# Patient Record
Sex: Female | Born: 1935 | Race: White | Hispanic: No | Marital: Single | Smoking: Former smoker
Health system: Southern US, Community
[De-identification: ages and names within clinical notes are randomized; demographics above are authoritative.]

## PROBLEM LIST (undated history)

## (undated) DIAGNOSIS — I10 Essential (primary) hypertension: Secondary | ICD-10-CM

## (undated) DIAGNOSIS — R609 Edema, unspecified: Secondary | ICD-10-CM

## (undated) DIAGNOSIS — F32A Depression, unspecified: Secondary | ICD-10-CM

## (undated) DIAGNOSIS — I4891 Unspecified atrial fibrillation: Secondary | ICD-10-CM

## (undated) DIAGNOSIS — J189 Pneumonia, unspecified organism: Secondary | ICD-10-CM

## (undated) DIAGNOSIS — E039 Hypothyroidism, unspecified: Secondary | ICD-10-CM

## (undated) DIAGNOSIS — F329 Major depressive disorder, single episode, unspecified: Secondary | ICD-10-CM

## (undated) DIAGNOSIS — IMO0001 Reserved for inherently not codable concepts without codable children: Secondary | ICD-10-CM

## (undated) DIAGNOSIS — R600 Localized edema: Secondary | ICD-10-CM

## (undated) DIAGNOSIS — M2550 Pain in unspecified joint: Secondary | ICD-10-CM

## (undated) DIAGNOSIS — F419 Anxiety disorder, unspecified: Secondary | ICD-10-CM

## (undated) DIAGNOSIS — K589 Irritable bowel syndrome without diarrhea: Secondary | ICD-10-CM

## (undated) DIAGNOSIS — J449 Chronic obstructive pulmonary disease, unspecified: Secondary | ICD-10-CM

## (undated) DIAGNOSIS — C50312 Malignant neoplasm of lower-inner quadrant of left female breast: Principal | ICD-10-CM

## (undated) DIAGNOSIS — G629 Polyneuropathy, unspecified: Secondary | ICD-10-CM

## (undated) DIAGNOSIS — C50919 Malignant neoplasm of unspecified site of unspecified female breast: Secondary | ICD-10-CM

## (undated) DIAGNOSIS — T8182XA Emphysema (subcutaneous) resulting from a procedure, initial encounter: Secondary | ICD-10-CM

## (undated) DIAGNOSIS — J349 Unspecified disorder of nose and nasal sinuses: Secondary | ICD-10-CM

## (undated) DIAGNOSIS — I350 Nonrheumatic aortic (valve) stenosis: Secondary | ICD-10-CM

## (undated) DIAGNOSIS — R233 Spontaneous ecchymoses: Secondary | ICD-10-CM

## (undated) DIAGNOSIS — Z860100 Personal history of colon polyps, unspecified: Secondary | ICD-10-CM

## (undated) DIAGNOSIS — Z8709 Personal history of other diseases of the respiratory system: Secondary | ICD-10-CM

## (undated) DIAGNOSIS — E785 Hyperlipidemia, unspecified: Secondary | ICD-10-CM

## (undated) DIAGNOSIS — IMO0002 Reserved for concepts with insufficient information to code with codable children: Secondary | ICD-10-CM

## (undated) DIAGNOSIS — R238 Other skin changes: Secondary | ICD-10-CM

## (undated) DIAGNOSIS — M797 Fibromyalgia: Secondary | ICD-10-CM

## (undated) DIAGNOSIS — M1990 Unspecified osteoarthritis, unspecified site: Secondary | ICD-10-CM

## (undated) DIAGNOSIS — I639 Cerebral infarction, unspecified: Secondary | ICD-10-CM

## (undated) DIAGNOSIS — Z789 Other specified health status: Secondary | ICD-10-CM

## (undated) HISTORY — DX: Malignant neoplasm of lower-inner quadrant of left female breast: C50.312

## (undated) HISTORY — DX: Reserved for concepts with insufficient information to code with codable children: IMO0002

## (undated) HISTORY — PX: COLONOSCOPY: SHX174

## (undated) HISTORY — DX: Hyperlipidemia, unspecified: E78.5

## (undated) HISTORY — DX: Unspecified disorder of nose and nasal sinuses: J34.9

## (undated) HISTORY — PX: OTHER SURGICAL HISTORY: SHX169

## (undated) HISTORY — PX: APPENDECTOMY: SHX54

## (undated) HISTORY — PX: TUBAL LIGATION: SHX77

## (undated) HISTORY — DX: Major depressive disorder, single episode, unspecified: F32.9

## (undated) HISTORY — DX: Essential (primary) hypertension: I10

## (undated) HISTORY — PX: BREAST LUMPECTOMY: SHX2

## (undated) HISTORY — DX: Reserved for inherently not codable concepts without codable children: IMO0001

## (undated) HISTORY — DX: Cerebral infarction, unspecified: I63.9

## (undated) HISTORY — PX: CHOLECYSTECTOMY: SHX55

## (undated) HISTORY — DX: Unspecified osteoarthritis, unspecified site: M19.90

## (undated) HISTORY — DX: Other specified health status: Z78.9

## (undated) HISTORY — DX: Depression, unspecified: F32.A

## (undated) HISTORY — PX: HEMORRHOID SURGERY: SHX153

## (undated) HISTORY — PX: TONSILLECTOMY: SUR1361

## (undated) HISTORY — DX: Irritable bowel syndrome, unspecified: K58.9

## (undated) HISTORY — DX: Fibromyalgia: M79.7

---

## 1997-08-23 ENCOUNTER — Other Ambulatory Visit: Admission: RE | Admit: 1997-08-23 | Discharge: 1997-08-23 | Payer: Self-pay | Admitting: *Deleted

## 1998-08-26 ENCOUNTER — Other Ambulatory Visit: Admission: RE | Admit: 1998-08-26 | Discharge: 1998-08-26 | Payer: Self-pay | Admitting: *Deleted

## 1999-09-18 ENCOUNTER — Encounter: Payer: Self-pay | Admitting: *Deleted

## 1999-09-18 ENCOUNTER — Encounter: Admission: RE | Admit: 1999-09-18 | Discharge: 1999-09-18 | Payer: Self-pay | Admitting: *Deleted

## 1999-10-02 ENCOUNTER — Encounter: Payer: Self-pay | Admitting: *Deleted

## 1999-10-02 ENCOUNTER — Encounter: Admission: RE | Admit: 1999-10-02 | Discharge: 1999-10-02 | Payer: Self-pay | Admitting: *Deleted

## 2000-09-19 ENCOUNTER — Encounter: Admission: RE | Admit: 2000-09-19 | Discharge: 2000-09-19 | Payer: Self-pay | Admitting: Internal Medicine

## 2000-09-19 ENCOUNTER — Encounter: Payer: Self-pay | Admitting: Internal Medicine

## 2001-10-21 ENCOUNTER — Ambulatory Visit (HOSPITAL_COMMUNITY): Admission: RE | Admit: 2001-10-21 | Discharge: 2001-10-21 | Payer: Self-pay | Admitting: Gastroenterology

## 2001-10-21 ENCOUNTER — Encounter (INDEPENDENT_AMBULATORY_CARE_PROVIDER_SITE_OTHER): Payer: Self-pay

## 2002-04-13 ENCOUNTER — Other Ambulatory Visit: Admission: RE | Admit: 2002-04-13 | Discharge: 2002-04-13 | Payer: Self-pay | Admitting: Obstetrics & Gynecology

## 2002-04-21 ENCOUNTER — Encounter: Admission: RE | Admit: 2002-04-21 | Discharge: 2002-04-21 | Payer: Self-pay | Admitting: Obstetrics & Gynecology

## 2002-04-21 ENCOUNTER — Encounter: Payer: Self-pay | Admitting: Obstetrics & Gynecology

## 2002-06-29 ENCOUNTER — Encounter (INDEPENDENT_AMBULATORY_CARE_PROVIDER_SITE_OTHER): Payer: Self-pay | Admitting: *Deleted

## 2002-06-29 ENCOUNTER — Ambulatory Visit (HOSPITAL_BASED_OUTPATIENT_CLINIC_OR_DEPARTMENT_OTHER): Admission: RE | Admit: 2002-06-29 | Discharge: 2002-06-29 | Payer: Self-pay | Admitting: General Surgery

## 2003-02-09 ENCOUNTER — Encounter: Admission: RE | Admit: 2003-02-09 | Discharge: 2003-02-09 | Payer: Self-pay | Admitting: Internal Medicine

## 2003-12-23 ENCOUNTER — Ambulatory Visit (HOSPITAL_COMMUNITY): Admission: RE | Admit: 2003-12-23 | Discharge: 2003-12-23 | Payer: Self-pay | Admitting: Internal Medicine

## 2004-08-18 ENCOUNTER — Encounter: Admission: RE | Admit: 2004-08-18 | Discharge: 2004-08-18 | Payer: Self-pay | Admitting: Internal Medicine

## 2005-05-30 ENCOUNTER — Ambulatory Visit (HOSPITAL_COMMUNITY): Admission: RE | Admit: 2005-05-30 | Discharge: 2005-05-30 | Payer: Self-pay | Admitting: Internal Medicine

## 2006-09-19 ENCOUNTER — Ambulatory Visit (HOSPITAL_COMMUNITY): Admission: RE | Admit: 2006-09-19 | Discharge: 2006-09-19 | Payer: Self-pay | Admitting: Internal Medicine

## 2006-09-20 ENCOUNTER — Encounter: Admission: RE | Admit: 2006-09-20 | Discharge: 2006-09-20 | Payer: Self-pay | Admitting: Internal Medicine

## 2007-06-30 ENCOUNTER — Encounter: Admission: RE | Admit: 2007-06-30 | Discharge: 2007-06-30 | Payer: Self-pay | Admitting: Internal Medicine

## 2007-07-02 ENCOUNTER — Encounter: Admission: RE | Admit: 2007-07-02 | Discharge: 2007-07-02 | Payer: Self-pay | Admitting: Internal Medicine

## 2007-10-14 ENCOUNTER — Encounter: Admission: RE | Admit: 2007-10-14 | Discharge: 2007-10-14 | Payer: Self-pay | Admitting: Internal Medicine

## 2008-11-23 ENCOUNTER — Encounter: Admission: RE | Admit: 2008-11-23 | Discharge: 2008-11-23 | Payer: Self-pay | Admitting: Internal Medicine

## 2008-11-30 ENCOUNTER — Encounter: Admission: RE | Admit: 2008-11-30 | Discharge: 2008-11-30 | Payer: Self-pay | Admitting: Internal Medicine

## 2008-12-03 ENCOUNTER — Encounter: Admission: RE | Admit: 2008-12-03 | Discharge: 2008-12-03 | Payer: Self-pay | Admitting: Internal Medicine

## 2009-10-27 ENCOUNTER — Ambulatory Visit: Payer: Self-pay | Admitting: Internal Medicine

## 2009-10-27 DIAGNOSIS — I10 Essential (primary) hypertension: Secondary | ICD-10-CM

## 2009-11-01 DIAGNOSIS — E785 Hyperlipidemia, unspecified: Secondary | ICD-10-CM

## 2009-11-01 DIAGNOSIS — E039 Hypothyroidism, unspecified: Secondary | ICD-10-CM | POA: Insufficient documentation

## 2009-11-01 DIAGNOSIS — F329 Major depressive disorder, single episode, unspecified: Secondary | ICD-10-CM | POA: Insufficient documentation

## 2009-11-01 DIAGNOSIS — M1990 Unspecified osteoarthritis, unspecified site: Secondary | ICD-10-CM | POA: Insufficient documentation

## 2009-11-01 DIAGNOSIS — J449 Chronic obstructive pulmonary disease, unspecified: Secondary | ICD-10-CM

## 2009-12-01 ENCOUNTER — Ambulatory Visit: Payer: Self-pay | Admitting: Internal Medicine

## 2009-12-01 DIAGNOSIS — R0602 Shortness of breath: Secondary | ICD-10-CM | POA: Insufficient documentation

## 2009-12-06 ENCOUNTER — Encounter: Admission: RE | Admit: 2009-12-06 | Discharge: 2009-12-06 | Payer: Self-pay | Admitting: Internal Medicine

## 2010-01-31 ENCOUNTER — Ambulatory Visit: Payer: Self-pay | Admitting: Internal Medicine

## 2010-03-07 ENCOUNTER — Encounter (HOSPITAL_COMMUNITY)
Admission: RE | Admit: 2010-03-07 | Discharge: 2010-03-28 | Payer: Self-pay | Source: Home / Self Care | Attending: Internal Medicine | Admitting: Internal Medicine

## 2010-03-20 ENCOUNTER — Encounter: Payer: Self-pay | Admitting: Internal Medicine

## 2010-03-28 NOTE — Miscellaneous (Signed)
Summary: Orders Update pft charges  Clinical Lists Changes  Orders: Added new Service order of Carbon Monoxide diffusing w/capacity (94720) - Signed Added new Service order of Lung Volumes (94240) - Signed Added new Service order of Spirometry (Pre & Post) (94060) - Signed 

## 2010-03-28 NOTE — Assessment & Plan Note (Signed)
Summary: rov 2 months///kp   Primary Provider/Referring Provider:  Ludwig Clarks  CC:  2 month follow up, denies cough, minimal sob with exertion, not taking advair states she doesnt need it, and uses spiriva twice a week.  History of Present Illness: History of Present Illness: October 27, 2009- 75 yoF seen on kind referral by Dr Ludwig Clarks with complaint of difficulty breathing for the past year. Over the past year and especially during the dry summer, she has noted exertional dyspnea, mild dry cough, occasional wheeze and chest tightness. These do not wake her from sleep or interfere with housework. Some days uses Proair rescue inhaler on first waking. Uses Proair at least once daily. Daily Spiriva is helpful and well tolerated. At cataract surgery 08/30/09, a nurse noted breathing difficulty and suggested she get help.  Hx mild seasonal rhinitis and acid reflux.  Had pneumovax w/in last 2 years and annual flu vax. Treated HTN but denies cardiac disease.  December 01, 2009- COPD Follow up visit after PFT and . Breathes some better as the cooler weather has come in. Had a single Pneumovax several years ago and is directed to Dr Ludwig Clarks who knows when that was to see if she is due for her single 5 yr repeat. Had flu shot.  She is getting along ok with little cough or wheeze. Uses Proair maybe once daily, usually in the morning. doesn't wake for it at night. Continues Spiriva, using samples. PFT- Severe obstructive airways disease with response to dilator, airtrapping and reduced DLCO.   FEV1 0.93/ 50%, FEV1/FVC 0.39, DLCO 66%. Emphysema pattern.   January 31, 2010- COPD Nurse-CC: 2 month follow up, denies cough, minimal sob with exertion, not taking advair states she doesnt need it, uses spiriva twice a week Hasn't felt need to try Advair ond only using Spiriva twice weekly. Denies morning cough, sleeps well. Up to date w/ vaccines. She fears becoming like her expired husband who died of COPD.      Preventive Screening-Counseling & Management  Alcohol-Tobacco     Smoking Status: quit     Packs/Day: .5     Year Started: 1942     Year Quit: 2006  Current Medications (verified): 1)  Levothyroxine Sodium 125 Mcg Tabs (Levothyroxine Sodium) .... Once Daily 2)  Atenolol-Chlorthalidone 50-25 Mg Tabs (Atenolol-Chlorthalidone) .... Once Daily 3)  Klor-Con 25 Meq Pack (Potassium Chloride) .... Two Times A Day 4)  Estradiol 1 Mg Tabs (Estradiol) .... Once Daily 5)  Medroxyprogesterone Acetate 2.5 Mg Tabs (Medroxyprogesterone Acetate) .... Once Daily 6)  Bupropion Hcl 100 Mg Tabs (Bupropion Hcl) .... Once Daily 7)  Questran 4 Gm Pack (Cholestyramine) .... Once Daily 8)  Sustane Eye Drops .... As Needed 9)  Multivitamins  Tabs (Multiple Vitamin) .... Once Daily 10)  Tylenol 325 Mg Tabs (Acetaminophen) .... 8tabs Per Day 11)  Lyrica 50 Mg Caps (Pregabalin) .... Two Times A Day 12)  Spiriva Handihaler 18 Mcg Caps (Tiotropium Bromide Monohydrate) .... Inhale One Capsule Once A Day 13)  Advair Diskus 100-50 Mcg/dose Aepb (Fluticasone-Salmeterol) .[name].. 1 Puff and Rinse Mouth, Twice Daily 14)  Proair Hfa 108 (90 Base) Mcg/act Aers (Albuterol Sulfate) .... As Needed  Allergies (verified): 1)  ! Pcn 2)  ! Codeine  Past History:  Past Medical History: Last updated: 12/01/2009 Hypertension Allergy problems sinus problems Arthritis IBS Fibromyalgia C O P D- severe COPD/ emphysema- 12/01/09- FEV1 0.93/ 50%, FEV1/FVC 0.39, + resp to BD Depression Hyperlipidemia Hypothyroidism Osteoarthritis Fibromyalgia  Past  Surgical History: Last updated: 10/27/2009 Tubal ligation--44 years ago Appendectomy-- 61 years ago Tonsillectomy--60 years ago hemorrhoidectomy--5 years ago Cholecystectomy--1998  Family History: Last updated: 10/27/2009 Pt was adopted and doesn't know  Social History: Last updated: 02/28/2010 Divorced- then he died COPD lives alone 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 Quit in 2006. Started 1944. 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 Quit in 2006. Started 1944. 1/2ppd  Review of Systems      See HPI       The patient complains of shortness of breath with activity.  The patient denies shortness of breath at rest, productive cough, non-productive cough, coughing up blood, chest pain, irregular heartbeats, acid heartburn, indigestion, loss of appetite, weight change, abdominal pain, difficulty swallowing, sore throat, tooth/dental problems, headaches, nasal congestion/difficulty breathing through nose, sneezing, itching, ear ache, hand/feet swelling, rash, change in color of mucus, and fever.    Vital Signs:  Patient profile:   76 year old female Height:      63 inches Weight:      144.4 pounds BMI:     25.67 O2 Sat:      98 % on Room air Pulse rate:   58 / minute BP sitting:   158 / 88  (left arm)  Vitals Entered By: Renold Genta RCP, LPN 2010/02/28 9:42 AM)  O2 Flow:  Room air CC: 2 month follow up, denies cough, minimal sob with exertion, not taking advair states she doesnt need it, uses spiriva twice a week Comments Medications reviewed with patient Renold Genta RCP, LPN  02/28/2010 9:47 AM    Physical Exam  Additional Exam:  General: A/Ox3; pleasant and cooperative, NAD, resting room air O2 sat 98% SKIN: no rash, lesions NODES: no lymphadenopathy HEENT: [place]/AT, EOM- WNL, Conjuctivae- clear, PERRLA, TM-WNL, Nose- clear, Throat- clear and wnl, own teeth, Mallampati  II NECK: Supple w/ fair ROM, JVD- none, normal carotid impulses w/o bruits Thyroid- normal to palpation CHEST: Clear to P&A, no wheeze, rhonchi or cough HEART: RRR, no m/g/r heard ABDOMEN: Soft and nl, trim ZOX:WRUE, nl  pulses, slight edema feet NEURO: Grossly intact to observation      Impression & Recommendations:  Problem # 1:  DYSPNEA (ICD-786.05)  Severe COPD, mainly emphysema pattern. I explained again the point of therapeutic trials, and the study suggesting Advair might slow the progression of COPD some over time.  We discussed dyspnea as a symptom, vs weakness and other limitations.  I suggested we let her go for a [place] interval so I can be available to her, but out of the way for now.   Other Orders: Est. Patient Level III (45409)  Patient Instructions: 1)  Please schedule a follow-up appointment in 6 months. 2)  You can continue with Spiriva up to once every day if it helps. 3)  You can try the Advair sample, 1 puff and rinse mouth twice daily. It builds gradually and may take a week or so before you can judge if it is helping you breathe more easily.

## 2010-03-28 NOTE — Assessment & Plan Note (Signed)
Summary: copd/apc   Primary Provider/Referring Provider:  Ludwig Clarks  CC:  Pulomary consult, difficulty breathing, sob with activity, non productive cough, and All going on for 1 years.  History of Present Illness: October 27, 2009- 76 yoF seen on kind referral by Dr Ludwig Clarks with complaint of difficulty breathing for the past year. Over the past year and especially during the dry summer, she has noted exertional dyspnea, mild dry cough, occasional wheeze and chest tightness. These do not wake her from sleep or interfere with housework. Some days uses Proair rescue inhaler on first waking. Uses Proair at least once daily. Daily Spiriva is helpful and well tolerated. At cataract surgery 08/30/09, a nurse noted breathing difficulty and suggested she get help.  Hx mild seasonal rhinitis and acid reflux.  Had pneumovax w/in last 2 years and annual flu vax. Treated HTN but denies cardiac disease.  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 Quit in 2006. Started 1944. 1/2ppd  Risk Factors: Smoking Status: quit (10/27/2009) Packs/Day: .5 (10/27/2009)  Past Medical History: Hypertension Allergy problems sinus problems Arthritis IBS Fibromyalgia C O P D Depression Hyperlipidemia Hypothyroidism Osteoarthritis Fibromyalgia  Past Surgical History: Tubal ligation--44 years ago Appendectomy-- 61 years ago Tonsillectomy--60 years ago hemorrhoidectomy--5 years ago Cholecystectomy--1998  Family History: Pt was adopted and doesn't know  Social History: Divorced lives alone Occupation--retired-- Industrial/product designer Patient states former  smoker. Quit in 2006. Started 1944. 1/2ppd  Smoking Status:  quit Packs/Day:  .5  Review of Systems       The patient complains of shortness of breath with activity, non-productive cough, weight change, difficulty swallowing, sneezing, anxiety, depression, hand/feet swelling, and joint stiffness or pain.  The patient denies shortness of breath at rest, productive cough, coughing up blood, chest pain, irregular heartbeats, acid heartburn, indigestion, loss of appetite, abdominal pain, sore throat, tooth/dental problems, headaches, nasal congestion/difficulty breathing through nose, itching, ear ache, rash, change in color of mucus, and fever.    Vital Signs:  Patient profile:   76 year old female Height:      63 inches Weight:      141.6 pounds BMI:     25.17 O2 Sat:      95 % on Room air Pulse rate:   54 / minute BP sitting:   140 / 84  (right arm) Cuff size:   regular  Vitals Entered By: Reynaldo Minium CMA (October 27, 2009 10:26 AM)  O2 Flow:  Room air  CC: [facility] consult, difficulty breathing, sob with activity, non productive cough, All going on for 1 years Is Patient Diabetic? No Comments meds and allergies updated Reynaldo Minium CMA  October 27, 2009 10:16 AM    Physical Exam  Additional Exam:  General: A/Ox3; pleasant and cooperative, NAD, resting room air O2 sat 95% SKIN: no rash, lesions NODES: no lymphadenopathy HEENT: [place]/AT, EOM- WNL, Conjuctivae- clear, PERRLA, TM-WNL, Nose- clear, Throat- clear and wnl, own teeth, Mallampati  II NECK: Supple w/ fair ROM, JVD- none, normal carotid impulses w/o bruits Thyroid- normal to palpation CHEST: Clear to P&A, no wheeze, rhonchit or cough HEART: RRR, no m/g/r heard ABDOMEN: Soft and nl; nml bowel  sounds; no organomegaly or masses noted, trim ZOX:WRUE, nl pulses, slight edema feet NEURO: Grossly intact to observation      Impression & Recommendations:  Problem # 1:  ? of COPD (ICD-496) This is probably mild COPD,  already recognzed and treated by Dr Ludwig Clarks,  based on her smoking hx. We discussed and encouraged regular aerobic exercise- walking now. She may be a good candidate for pulmonary rehab later. The current meds are reasonable and I won't consider changes until we update data base with PFT and CXR. She seems to be up to date on pneumovax. She gets annual flu vax.  Medications Added to Medication List This Visit: 1)  Levothyroxine Sodium 125 Mcg Tabs (Levothyroxine sodium) .... Once daily 2)  Atenolol-chlorthalidone 50-25 Mg Tabs (Atenolol-chlorthalidone) .... Once daily 3)  Klor-con 25 Meq Pack (Potassium chloride) .... Two times a day 4)  Estradiol 1 Mg Tabs (Estradiol) .... Once daily 5)  Medroxyprogesterone Acetate 2.5 Mg Tabs (Medroxyprogesterone acetate) .... Once daily 6)  Bupropion Hcl 100 Mg Tabs (Bupropion hcl) .... Once daily 7)  Questran 4 Gm Pack (Cholestyramine) .... Once daily 8)  Doxycycline Hyclate 50 Mg Caps (Doxycycline hyclate) .... Once daily 9)  Sustane Eye Drops  .... As needed 10)  Multivitamins Tabs (Multiple vitamin) .... Once daily 11)  Tylenol 325 Mg Tabs (Acetaminophen) .... 8tabs per day 12)  Lyrica 50 Mg Caps (Pregabalin) .... Two times a day 13)  Spiriva Handihaler 18 Mcg Caps (Tiotropium bromide monohydrate) .... Inhale one capsule once a day 14)  Proair Hfa 108 (90 Base) Mcg/act Aers (Albuterol sulfate) .... As needed 15)  Albuterol Inhaler  .... As needed  Other Orders: Consultation Level IV (45409) Full Pulmonary Function Test (PFT) T-2 View CXR ([id number])  Patient Instructions: 1)  Please schedule a follow-up appointment in 1 month. 2)  See [facility] to schedule PFT and 6 minute walk test 3)  A chest x-ray has been recommended.  Your imaging study may require preauthorization.  4)  continue your present meds for now. We will reconsider medications at your next visit with me.  5)  .............................................................................. 6)   DG CHEST 2 VIEW - [id number] 7)    8)  Clinical Data: Shortness of breath, hypertension, former smoker 9)    10)  CHEST - 2 VIEW 11)    12)  Comparison: Chest x-ray of 08/18/2004 13)    14)  Findings: The lungs are clear but hyperaerated consistent with 15)  COPD.  Mediastinal contours are stable.  The heart is within normal 16)  limits in size.  No bony abnormality is seen. 17)    18)  IMPRESSION: 19)  COPD.  No active lung disease. 20)    21)  Read By:  Juline Patch,  M.D.     22)  Released By:  Juline Patch,  M.D. 23)  ___   Immunization History:  Influenza Immunization History:    Influenza:  historical (11/26/2008)  Pneumovax Immunization History:    Pneumovax:  historical (11/26/2008)

## 2010-03-28 NOTE — Assessment & Plan Note (Signed)
Summary: SIX MIN WALK-PULM STRESS TEST  Nurse Visit   Vital Signs:  Patient profile:   76 year old female Pulse rate:   58 / minute BP sitting:   138 / 82  Medications Prior to Update: 1)  Levothyroxine Sodium 125 Mcg Tabs (Levothyroxine Sodium) .... Once Daily 2)  Atenolol-Chlorthalidone 50-25 Mg Tabs (Atenolol-Chlorthalidone) .... Once Daily 3)  Klor-Con 25 Meq Pack (Potassium Chloride) .... Two Times A Day 4)  Estradiol 1 Mg Tabs (Estradiol) .... Once Daily 5)  Medroxyprogesterone Acetate 2.5 Mg Tabs (Medroxyprogesterone Acetate) .... Once Daily 6)  Bupropion Hcl 100 Mg Tabs (Bupropion Hcl) .... Once Daily 7)  Questran 4 Gm Pack (Cholestyramine) .... Once Daily 8)  Doxycycline Hyclate 50 Mg Caps (Doxycycline Hyclate) .... Once Daily 9)  Sustane Eye Drops .... As Needed 10)  Multivitamins  Tabs (Multiple Vitamin) .... Once Daily 11)  Tylenol 325 Mg Tabs (Acetaminophen) .... 8tabs Per Day 12)  Lyrica 50 Mg Caps (Pregabalin) .... Two Times A Day 13)  Spiriva Handihaler 18 Mcg Caps (Tiotropium Bromide Monohydrate) .... Inhale One Capsule Once A Day 14)  Proair Hfa 108 (90 Base) Mcg/act Aers (Albuterol Sulfate) .... As Needed 15)  Albuterol Inhaler .... As Needed  Orders Added: 1)  Pulmonary Stress (6 min walk) [94620]   Six Minute Walk Test Medications taken before test(dose and time): 1)  Levothyroxine Sodium 125 Mcg Tabs (Levothyroxine Sodium) .... Once Daily 2)  Atenolol-Chlorthalidone 50-25 Mg Tabs (Atenolol-Chlorthalidone) .... Once Daily 3)  Klor-Con 25 Meq Pack (Potassium Chloride) .... Two Times A Day 4)  Estradiol 1 Mg Tabs (Estradiol) .... Once Daily 5)  Medroxyprogesterone Acetate 2.5 Mg Tabs (Medroxyprogesterone Acetate) .... Once Daily 6)  Bupropion Hcl 100 Mg Tabs (Bupropion Hcl) .... Once Daily 7)  Questran 4 Gm Pack (Cholestyramine) .... Once Daily 8)  Doxycycline Hyclate 50 Mg Caps (Doxycycline Hyclate) .... Once Daily 9)  Sustane Eye Drops .... As Needed 10)   Multivitamins  Tabs (Multiple Vitamin) .... Once Daily 11)  Tylenol 325 Mg Tabs (Acetaminophen) .... 8tabs Per Day 12)  Lyrica 50 Mg Caps (Pregabalin) .... Two Times A Day 13)  Spiriva Handihaler 18 Mcg Caps (Tiotropium Bromide Monohydrate) .... Inhale One Capsule Once A Day 14)  Proair Hfa 108 (90 Base) Mcg/act Aers (Albuterol Sulfate) .... As Needed 15)  Albuterol Inhaler .... As Needed Pt took all meds at 7:30am except inhalers which she states she took around 6:00am Supplemental oxygen during the test: No  Lap counter(place a tick mark inside a square for each lap completed) lap 1 complete  lap 2 complete   lap 3 complete   lap 4 complete  lap 5 complete  lap 6 complete  lap 7 complete    Baseline  BP sitting: 138/ 82 Heart rate: 58 Dyspnea ( Borg scale) 0 Fatigue (Borg scale) 0 SPO2 97  End Of Test  BP sitting: 148/ 98 Heart rate: 82 Dyspnea ( Borg scale) 3 Fatigue (Borg scale) 0 SPO2 95  2 Minutes post  BP sitting: 142/ 84 Heart rate: 62 SPO2 99  Stopped or paused before six minutes? No  Interpretation: Number of laps  7 X 48 meters =   336 meters =    336 meters   Total distance walked in six minutes: 336 meters  Tech ID: Tivis Ringer, CNA (December 01, 2009 10:11 AM) Jeremy Johann Comments Pt completed test w/ 0 rest breaks and 1 complaint: SOB which resolved at 2 mins post.

## 2010-03-28 NOTE — Assessment & Plan Note (Signed)
Summary: rov after smw / pft ///kp   Primary Provider/Referring Provider:  Ludwig Clarks  CC:  Follow up visit after PFT and .[name]  History of Present Illness: History of Present Illness: October 27, 2009- 76 yoF seen on kind referral by Dr Ludwig Clarks with complaint of difficulty breathing for the past year. Over the past year and especially during the dry summer, she has noted exertional dyspnea, mild dry cough, occasional wheeze and chest tightness. These do not wake her from sleep or interfere with housework. Some days uses Proair rescue inhaler on first waking. Uses Proair at least once daily. Daily Spiriva is helpful and well tolerated. At cataract surgery 08/30/09, a nurse noted breathing difficulty and suggested she get help.  Hx mild seasonal rhinitis and acid reflux.  Had pneumovax w/in last 2 years and annual flu vax. Treated HTN but denies cardiac disease.  December 01, 2009- COPD Follow up visit after PFT and . Breathes some better as the cooler weather has come in. Had a single Pneumovax several years ago and is directed to Dr Ludwig Clarks who knows when that was to see if she is due for her single 5 yr repeat. Had flu shot.  She is getting along ok with little cough or wheeze. Uses Proair maybe once daily, usually in the morning. doesn't wake for it at night. Continues Spiriva, using samples. PFT- Severe obstructive airways disease with response to dilator, airtrapping and reduced DLCO.   FEV1 0.93/ 50%, FEV1/FVC 0.39, DLCO 66%. Emphysema pattern.      Preventive Screening-Counseling & Management  Alcohol-Tobacco     Smoking Status: quit     Packs/Day: .5     Year Started: 1942     Year Quit: 2006  Current Medications (verified): 1)  Levothyroxine Sodium 125 Mcg Tabs (Levothyroxine Sodium) .... Once Daily 2)  Atenolol-Chlorthalidone 50-25 Mg Tabs (Atenolol-Chlorthalidone) .... Once Daily 3)  Klor-Con 25 Meq Pack (Potassium Chloride) .... Two Times A Day 4)  Estradiol 1 Mg Tabs  (Estradiol) .... Once Daily 5)  Medroxyprogesterone Acetate 2.5 Mg Tabs (Medroxyprogesterone Acetate) .... Once Daily 6)  Bupropion Hcl 100 Mg Tabs (Bupropion Hcl) .... Once Daily 7)  Questran 4 Gm Pack (Cholestyramine) .... Once Daily 8)  Doxycycline Hyclate 50 Mg Caps (Doxycycline Hyclate) .... Once Daily 9)  Sustane Eye Drops .... As Needed 10)  Multivitamins  Tabs (Multiple Vitamin) .... Once Daily 11)  Tylenol 325 Mg Tabs (Acetaminophen) .... 8tabs Per Day 12)  Lyrica 50 Mg Caps (Pregabalin) .... Two Times A Day 13)  Spiriva Handihaler 18 Mcg Caps (Tiotropium Bromide Monohydrate) .... Inhale One Capsule Once A Day 14)  Proair Hfa 108 (90 Base) Mcg/act Aers (Albuterol Sulfate) .... As Needed 15)  Albuterol Inhaler .... As Needed  Allergies (verified): 1)  ! Pcn 2)  ! 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 Quit in 2006. Started 1944. 1/2ppd  Risk Factors: Smoking Status: quit (12/01/2009) Packs/Day: .5 (12/01/2009)  Past Medical History: Hypertension Allergy problems sinus problems Arthritis IBS Fibromyalgia C O P D- severe COPD/ emphysema- 12/01/09- FEV1 0.93/ 50%, FEV1/FVC 0.39, + resp to BD Depression Hyperlipidemia Hypothyroidism Osteoarthritis Fibromyalgia  Review of Systems      See HPI  The patient complains of shortness of breath with activity and non-productive cough.  The patient denies shortness of breath at rest, productive cough, coughing up blood, chest pain, irregular heartbeats, acid heartburn, indigestion, loss of appetite, weight change, abdominal pain,  difficulty swallowing, sore throat, tooth/dental problems, headaches, nasal congestion/difficulty breathing through nose, sneezing, itching, rash, change in color of mucus, and fever.    Vital Signs:  Patient profile:   76 year old female Height:      63 inches Weight:      143 pounds BMI:     25.42 O2 Sat:      94 % on Room air Pulse rate:   62 / minute BP sitting:   170 / 82  (right arm) Cuff size:   regular  Vitals Entered By: Reynaldo Minium CMA (December 01, 2009 11:27 AM)  O2 Flow:  Room air CC: Follow up visit after PFT and .   Physical Exam  Additional Exam:  General: A/Ox3; pleasant and cooperative, NAD, resting room air O2 sat 94% SKIN: no rash, lesions NODES: no lymphadenopathy HEENT: [place]/AT, EOM- WNL, Conjuctivae- clear, PERRLA, TM-WNL, Nose- clear, Throat- clear and wnl, own teeth, Mallampati  II NECK: Supple w/ fair ROM, JVD- none, normal carotid impulses w/o bruits Thyroid- normal to palpation CHEST: Clear to P&A, no wheeze, rhonchi or cough HEART: RRR, no m/g/r heard ABDOMEN: Soft and nl, trim NGE:XBMW, nl pulses, slight edema feet NEURO: Grossly intact to observation      Impression & Recommendations:  Problem # 1:  C O P D (ICD-496) Severe COPD, mainly emphysema with a reversible component.  We will formalize her Spiriva with a script, and add a trial of Advair for effort to slow progression. We discussed and will refer her to pulmonary rehabilitation.  Problem # 2:  DYSPNEA (ICD-786.05) Dyspnea on exertion relates mainly to her lung disease and maybe some deconditioning. Cardiac symptoms were discussed for education,  but not identified now.   Medications Added to Medication List This Visit: 1)  Advair Diskus 100-50 Mcg/dose Aepb (Fluticasone-salmeterol) .[name].. 1 puff and rinse mouth, twice daily  Other Orders: Consultation Level IV (41324) Rehabilitation Referral (Rehab)  Patient Instructions: 1)  cc Dr Ludwig Clarks 2)  Please schedule a follow-up  appointment in 2 months. 3)  Script to use Spiriva once daily 4)  Sample/ script to start Advair 100/50: 5)        1 puff and rinse mouth, twice daily 6)  Continue using Proair as a rescue inhaler up to 4 x daily if needed 7)  [facility] for referral to the Pulmonary Rehab program. Prescriptions: ADVAIR DISKUS 100-50 MCG/DOSE AEPB (FLUTICASONE-SALMETEROL) 1 puff and rinse mouth, twice daily  #1 x prn   Entered and Authorized by:   Waymon Budge MD   Signed by:   Waymon Budge MD on 12/01/2009   Method used:   Print then Give to Patient   RxID:   [id number] SPIRIVA HANDIHALER 18 MCG CAPS (TIOTROPIUM BROMIDE MONOHYDRATE) inhale one capsule once a day  #30 x prn   Entered and Authorized by:   Waymon Budge MD   Signed by:   Waymon Budge MD on 12/01/2009   Method used:   Print then Give to Patient   RxID:   [id number]    Immunization History:  Influenza Immunization History:    Influenza:  historical (11/28/2009)

## 2010-03-30 ENCOUNTER — Encounter (HOSPITAL_COMMUNITY): Payer: Medicare Other | Attending: Internal Medicine

## 2010-03-30 DIAGNOSIS — F329 Major depressive disorder, single episode, unspecified: Secondary | ICD-10-CM | POA: Insufficient documentation

## 2010-03-30 DIAGNOSIS — F3289 Other specified depressive episodes: Secondary | ICD-10-CM | POA: Insufficient documentation

## 2010-03-30 DIAGNOSIS — I10 Essential (primary) hypertension: Secondary | ICD-10-CM | POA: Insufficient documentation

## 2010-03-30 DIAGNOSIS — R0989 Other specified symptoms and signs involving the circulatory and respiratory systems: Secondary | ICD-10-CM | POA: Insufficient documentation

## 2010-03-30 DIAGNOSIS — E785 Hyperlipidemia, unspecified: Secondary | ICD-10-CM | POA: Insufficient documentation

## 2010-03-30 DIAGNOSIS — Z5189 Encounter for other specified aftercare: Secondary | ICD-10-CM | POA: Insufficient documentation

## 2010-03-30 DIAGNOSIS — J4489 Other specified chronic obstructive pulmonary disease: Secondary | ICD-10-CM | POA: Insufficient documentation

## 2010-03-30 DIAGNOSIS — E039 Hypothyroidism, unspecified: Secondary | ICD-10-CM | POA: Insufficient documentation

## 2010-03-30 DIAGNOSIS — R0609 Other forms of dyspnea: Secondary | ICD-10-CM | POA: Insufficient documentation

## 2010-03-30 DIAGNOSIS — J449 Chronic obstructive pulmonary disease, unspecified: Secondary | ICD-10-CM | POA: Insufficient documentation

## 2010-04-04 ENCOUNTER — Encounter (HOSPITAL_COMMUNITY): Payer: Medicare Other

## 2010-04-06 ENCOUNTER — Encounter (HOSPITAL_COMMUNITY): Payer: Medicare Other

## 2010-04-11 ENCOUNTER — Encounter (HOSPITAL_COMMUNITY): Payer: Medicare Other

## 2010-04-13 ENCOUNTER — Encounter (HOSPITAL_COMMUNITY): Payer: Medicare Other

## 2010-04-18 ENCOUNTER — Encounter (HOSPITAL_COMMUNITY): Payer: Medicare Other

## 2010-04-20 ENCOUNTER — Encounter (HOSPITAL_COMMUNITY): Payer: Medicare Other

## 2010-04-25 ENCOUNTER — Encounter (HOSPITAL_COMMUNITY): Payer: Medicare Other

## 2010-04-27 ENCOUNTER — Encounter (HOSPITAL_COMMUNITY): Payer: Medicare Other | Attending: Internal Medicine

## 2010-04-27 DIAGNOSIS — J449 Chronic obstructive pulmonary disease, unspecified: Secondary | ICD-10-CM | POA: Insufficient documentation

## 2010-04-27 DIAGNOSIS — F3289 Other specified depressive episodes: Secondary | ICD-10-CM | POA: Insufficient documentation

## 2010-04-27 DIAGNOSIS — E039 Hypothyroidism, unspecified: Secondary | ICD-10-CM | POA: Insufficient documentation

## 2010-04-27 DIAGNOSIS — R0989 Other specified symptoms and signs involving the circulatory and respiratory systems: Secondary | ICD-10-CM | POA: Insufficient documentation

## 2010-04-27 DIAGNOSIS — R0609 Other forms of dyspnea: Secondary | ICD-10-CM | POA: Insufficient documentation

## 2010-04-27 DIAGNOSIS — J4489 Other specified chronic obstructive pulmonary disease: Secondary | ICD-10-CM | POA: Insufficient documentation

## 2010-04-27 DIAGNOSIS — E785 Hyperlipidemia, unspecified: Secondary | ICD-10-CM | POA: Insufficient documentation

## 2010-04-27 DIAGNOSIS — F329 Major depressive disorder, single episode, unspecified: Secondary | ICD-10-CM | POA: Insufficient documentation

## 2010-04-27 DIAGNOSIS — I10 Essential (primary) hypertension: Secondary | ICD-10-CM | POA: Insufficient documentation

## 2010-04-27 DIAGNOSIS — Z5189 Encounter for other specified aftercare: Secondary | ICD-10-CM | POA: Insufficient documentation

## 2010-05-02 ENCOUNTER — Encounter (HOSPITAL_COMMUNITY): Payer: Medicare Other

## 2010-05-04 ENCOUNTER — Encounter (HOSPITAL_COMMUNITY): Payer: Medicare Other

## 2010-05-09 ENCOUNTER — Encounter (HOSPITAL_COMMUNITY): Payer: Medicare Other

## 2010-05-11 ENCOUNTER — Encounter (HOSPITAL_COMMUNITY): Payer: Medicare Other

## 2010-05-16 ENCOUNTER — Encounter (HOSPITAL_COMMUNITY): Payer: Medicare Other

## 2010-05-18 ENCOUNTER — Encounter (HOSPITAL_COMMUNITY): Payer: Medicare Other

## 2010-05-23 ENCOUNTER — Encounter (HOSPITAL_COMMUNITY): Payer: Medicare Other

## 2010-05-25 ENCOUNTER — Encounter (HOSPITAL_COMMUNITY): Payer: Medicare Other

## 2010-05-30 ENCOUNTER — Encounter (HOSPITAL_COMMUNITY): Payer: Medicare Other | Attending: Internal Medicine

## 2010-05-30 DIAGNOSIS — R0609 Other forms of dyspnea: Secondary | ICD-10-CM | POA: Insufficient documentation

## 2010-05-30 DIAGNOSIS — E039 Hypothyroidism, unspecified: Secondary | ICD-10-CM | POA: Insufficient documentation

## 2010-05-30 DIAGNOSIS — J4489 Other specified chronic obstructive pulmonary disease: Secondary | ICD-10-CM | POA: Insufficient documentation

## 2010-05-30 DIAGNOSIS — Z5189 Encounter for other specified aftercare: Secondary | ICD-10-CM | POA: Insufficient documentation

## 2010-05-30 DIAGNOSIS — F329 Major depressive disorder, single episode, unspecified: Secondary | ICD-10-CM | POA: Insufficient documentation

## 2010-05-30 DIAGNOSIS — I10 Essential (primary) hypertension: Secondary | ICD-10-CM | POA: Insufficient documentation

## 2010-05-30 DIAGNOSIS — F3289 Other specified depressive episodes: Secondary | ICD-10-CM | POA: Insufficient documentation

## 2010-05-30 DIAGNOSIS — J449 Chronic obstructive pulmonary disease, unspecified: Secondary | ICD-10-CM | POA: Insufficient documentation

## 2010-05-30 DIAGNOSIS — R0989 Other specified symptoms and signs involving the circulatory and respiratory systems: Secondary | ICD-10-CM | POA: Insufficient documentation

## 2010-05-30 DIAGNOSIS — E785 Hyperlipidemia, unspecified: Secondary | ICD-10-CM | POA: Insufficient documentation

## 2010-06-01 ENCOUNTER — Encounter (HOSPITAL_COMMUNITY): Payer: Medicare Other

## 2010-06-06 ENCOUNTER — Encounter (HOSPITAL_COMMUNITY): Payer: Medicare Other

## 2010-06-08 ENCOUNTER — Encounter (HOSPITAL_COMMUNITY): Payer: Medicare Other

## 2010-06-13 ENCOUNTER — Encounter (HOSPITAL_COMMUNITY): Payer: Medicare Other

## 2010-06-15 ENCOUNTER — Encounter (HOSPITAL_COMMUNITY): Payer: Medicare Other

## 2010-06-20 ENCOUNTER — Encounter (HOSPITAL_COMMUNITY): Payer: Medicare Other

## 2010-06-22 ENCOUNTER — Encounter (HOSPITAL_COMMUNITY): Payer: Medicare Other

## 2010-06-27 ENCOUNTER — Encounter (HOSPITAL_COMMUNITY): Payer: Medicare Other

## 2010-06-29 ENCOUNTER — Encounter (HOSPITAL_COMMUNITY): Payer: Medicare Other

## 2010-07-04 ENCOUNTER — Encounter (HOSPITAL_COMMUNITY): Payer: Medicare Other

## 2010-07-06 ENCOUNTER — Encounter (HOSPITAL_COMMUNITY): Payer: Medicare Other

## 2010-07-09 ENCOUNTER — Inpatient Hospital Stay (HOSPITAL_COMMUNITY)
Admission: EM | Admit: 2010-07-09 | Discharge: 2010-07-18 | DRG: 641 | Disposition: A | Discharge status: Home Health | Payer: Medicare Other | Attending: Internal Medicine | Admitting: Internal Medicine

## 2010-07-09 ENCOUNTER — Emergency Department (HOSPITAL_COMMUNITY): Payer: Medicare Other

## 2010-07-09 DIAGNOSIS — T43205A Adverse effect of unspecified antidepressants, initial encounter: Secondary | ICD-10-CM | POA: Diagnosis present

## 2010-07-09 DIAGNOSIS — Z79899 Other long term (current) drug therapy: Secondary | ICD-10-CM

## 2010-07-09 DIAGNOSIS — N393 Stress incontinence (female) (male): Secondary | ICD-10-CM | POA: Diagnosis present

## 2010-07-09 DIAGNOSIS — IMO0001 Reserved for inherently not codable concepts without codable children: Secondary | ICD-10-CM | POA: Diagnosis present

## 2010-07-09 DIAGNOSIS — T502X5A Adverse effect of carbonic-anhydrase inhibitors, benzothiadiazides and other diuretics, initial encounter: Secondary | ICD-10-CM | POA: Diagnosis present

## 2010-07-09 DIAGNOSIS — J441 Chronic obstructive pulmonary disease with (acute) exacerbation: Secondary | ICD-10-CM | POA: Diagnosis not present

## 2010-07-09 DIAGNOSIS — R609 Edema, unspecified: Secondary | ICD-10-CM | POA: Diagnosis not present

## 2010-07-09 DIAGNOSIS — E876 Hypokalemia: Secondary | ICD-10-CM | POA: Diagnosis present

## 2010-07-09 DIAGNOSIS — R5381 Other malaise: Secondary | ICD-10-CM | POA: Diagnosis not present

## 2010-07-09 DIAGNOSIS — A088 Other specified intestinal infections: Secondary | ICD-10-CM | POA: Diagnosis present

## 2010-07-09 DIAGNOSIS — M810 Age-related osteoporosis without current pathological fracture: Secondary | ICD-10-CM | POA: Diagnosis present

## 2010-07-09 DIAGNOSIS — I959 Hypotension, unspecified: Secondary | ICD-10-CM | POA: Diagnosis not present

## 2010-07-09 DIAGNOSIS — E7800 Pure hypercholesterolemia, unspecified: Secondary | ICD-10-CM | POA: Diagnosis present

## 2010-07-09 DIAGNOSIS — E039 Hypothyroidism, unspecified: Secondary | ICD-10-CM | POA: Diagnosis present

## 2010-07-09 DIAGNOSIS — E871 Hypo-osmolality and hyponatremia: Principal | ICD-10-CM | POA: Diagnosis present

## 2010-07-09 DIAGNOSIS — Z880 Allergy status to penicillin: Secondary | ICD-10-CM

## 2010-07-09 DIAGNOSIS — IMO0002 Reserved for concepts with insufficient information to code with codable children: Secondary | ICD-10-CM

## 2010-07-09 DIAGNOSIS — Z79890 Hormone replacement therapy (postmenopausal): Secondary | ICD-10-CM

## 2010-07-09 DIAGNOSIS — E46 Unspecified protein-calorie malnutrition: Secondary | ICD-10-CM | POA: Diagnosis present

## 2010-07-09 DIAGNOSIS — I4891 Unspecified atrial fibrillation: Secondary | ICD-10-CM | POA: Diagnosis not present

## 2010-07-09 DIAGNOSIS — T448X5A Adverse effect of centrally-acting and adrenergic-neuron-blocking agents, initial encounter: Secondary | ICD-10-CM | POA: Diagnosis present

## 2010-07-09 DIAGNOSIS — I10 Essential (primary) hypertension: Secondary | ICD-10-CM | POA: Diagnosis present

## 2010-07-09 DIAGNOSIS — E869 Volume depletion, unspecified: Secondary | ICD-10-CM | POA: Diagnosis present

## 2010-07-09 LAB — BASIC METABOLIC PANEL
BUN: 10 mg/dL (ref 6–23)
CO2: 25 mEq/L (ref 19–32)
CO2: 22 mEq/L (ref 19–32)
Calcium: 8.1 mg/dL — ABNORMAL LOW (ref 8.4–10.5)
Calcium: 7.8 mg/dL — ABNORMAL LOW (ref 8.4–10.5)
Chloride: 72 mEq/L — ABNORMAL LOW (ref 96–112)
Chloride: 74 mEq/L — ABNORMAL LOW (ref 96–112)
Creatinine, Ser: <0.47 mg/dL (ref 0.4–1.2)

## 2010-07-09 LAB — CBC
HCT: 36.4 % (ref 36.0–46.0)
Hemoglobin: 13.4 g/dL (ref 12.0–15.0)
MCH: 32.5 pg (ref 26.0–34.0)
MCV: 88.3 fL (ref 78.0–100.0)
RBC: 4.12 MIL/uL (ref 3.87–5.11)

## 2010-07-09 LAB — DIFFERENTIAL
Basophils Absolute: 0 10*3/uL (ref 0.0–0.1)
Neutrophils Relative %: 87 % — ABNORMAL HIGH (ref 43–77)

## 2010-07-09 LAB — URINALYSIS, ROUTINE W REFLEX MICROSCOPIC
Bilirubin Urine: NEGATIVE
Glucose, UA: NEGATIVE mg/dL
Ketones, ur: 40 mg/dL — AB
Leukocytes, UA: NEGATIVE
Nitrite: NEGATIVE
Specific Gravity, Urine: 1.013 (ref 1.005–1.030)
pH: 6.5 (ref 5.0–8.0)

## 2010-07-09 LAB — POCT I-STAT, CHEM 8
BUN: 13 mg/dL (ref 6–23)
Creatinine, Ser: 0.6 mg/dL (ref 0.4–1.2)
HCT: 43 % (ref 36.0–46.0)
Hemoglobin: 14.6 g/dL (ref 12.0–15.0)
Potassium: 3.3 mEq/L — ABNORMAL LOW (ref 3.5–5.1)
TCO2: 26 mmol/L (ref 0–100)

## 2010-07-10 LAB — BASIC METABOLIC PANEL
BUN: 7 mg/dL (ref 6–23)
CO2: 25 mEq/L (ref 19–32)
CO2: 20 mEq/L (ref 19–32)
Chloride: 90 mEq/L — ABNORMAL LOW (ref 96–112)
Chloride: 89 mEq/L — ABNORMAL LOW (ref 96–112)
Creatinine, Ser: <0.47 mg/dL (ref 0.4–1.2)
GFR calc Af Amer: >60 mL/min (ref >60)
Glucose, Bld: 124 mg/dL — ABNORMAL HIGH (ref 70–99)
Glucose, Bld: 170 mg/dL — ABNORMAL HIGH (ref 70–99)
Potassium: 4.1 mEq/L (ref 3.5–5.1)
Potassium: 3.9 mEq/L (ref 3.5–5.1)
Potassium: 4.3 mEq/L (ref 3.5–5.1)
Sodium: 116 mEq/L — CL (ref 135–145)
Sodium: 124 mEq/L — ABNORMAL LOW (ref 135–145)

## 2010-07-10 LAB — COMPREHENSIVE METABOLIC PANEL
ALT: 18 U/L (ref 0–35)
Albumin: 2.8 g/dL — ABNORMAL LOW (ref 3.5–5.2)
Alkaline Phosphatase: 27 U/L — ABNORMAL LOW (ref 39–117)
BUN: 7 mg/dL (ref 6–23)
Chloride: 87 mEq/L — ABNORMAL LOW (ref 96–112)
Glucose, Bld: 104 mg/dL — ABNORMAL HIGH (ref 70–99)
Potassium: 4.3 mEq/L (ref 3.5–5.1)
Sodium: 120 mEq/L — ABNORMAL LOW (ref 135–145)
Total Bilirubin: 0.2 mg/dL — ABNORMAL LOW (ref 0.3–1.2)

## 2010-07-10 LAB — PHOSPHORUS: Phosphorus: 1 mg/dL — CL (ref 2.3–4.6)

## 2010-07-10 LAB — CBC
HCT: 36 % (ref 36.0–46.0)
MCV: 90.7 fL (ref 78.0–100.0)
Platelets: 283 10*3/uL (ref 150–400)
RBC: 3.97 MIL/uL (ref 3.87–5.11)
WBC: 16 10*3/uL — ABNORMAL HIGH (ref 4.0–10.5)

## 2010-07-11 ENCOUNTER — Encounter (HOSPITAL_COMMUNITY): Payer: Medicare Other

## 2010-07-11 LAB — CARDIAC PANEL(CRET KIN+CKTOT+MB+TROPI)
CK, MB: 27.4 ng/mL (ref 0.3–4.0)
CK, MB: 23.8 ng/mL (ref 0.3–4.0)
CK, MB: 26.8 ng/mL (ref 0.3–4.0)
Relative Index: 5.9 — ABNORMAL HIGH (ref 0.0–2.5)
Relative Index: 7 — ABNORMAL HIGH (ref 0.0–2.5)
Relative Index: 7.3 — ABNORMAL HIGH (ref 0.0–2.5)
Relative Index: 7.8 — ABNORMAL HIGH (ref 0.0–2.5)
Total CK: 468 U/L — ABNORMAL HIGH (ref 7–177)
Total CK: 340 U/L — ABNORMAL HIGH (ref 7–177)
Total CK: 359 U/L — ABNORMAL HIGH (ref 7–177)
Total CK: 365 U/L — ABNORMAL HIGH (ref 7–177)
Troponin I: <0.3 ng/mL (ref <0.30)
Troponin I: <0.3 ng/mL (ref <0.30)

## 2010-07-11 LAB — BASIC METABOLIC PANEL WITH GFR
BUN: 5 mg/dL — ABNORMAL LOW (ref 6–23)
BUN: 6 mg/dL (ref 6–23)
BUN: 6 mg/dL (ref 6–23)
CO2: 24 meq/L (ref 19–32)
CO2: 23 meq/L (ref 19–32)
CO2: 26 meq/L (ref 19–32)
Calcium: 7.4 mg/dL — ABNORMAL LOW (ref 8.4–10.5)
Calcium: 7.5 mg/dL — ABNORMAL LOW (ref 8.4–10.5)
Calcium: 7.6 mg/dL — ABNORMAL LOW (ref 8.4–10.5)
Chloride: 92 meq/L — ABNORMAL LOW (ref 96–112)
Chloride: 92 meq/L — ABNORMAL LOW (ref 96–112)
Chloride: 93 meq/L — ABNORMAL LOW (ref 96–112)
Creatinine, Ser: <0.47 mg/dL (ref 0.4–1.2)
Creatinine, Ser: <0.47 mg/dL (ref 0.4–1.2)
Creatinine, Ser: 0.49 mg/dL (ref 0.4–1.2)
GFR calc non Af Amer: >60 mL/min
Glucose, Bld: 170 mg/dL — ABNORMAL HIGH (ref 70–99)
Glucose, Bld: 137 mg/dL — ABNORMAL HIGH (ref 70–99)
Glucose, Bld: 138 mg/dL — ABNORMAL HIGH (ref 70–99)
Potassium: 3.4 meq/L — ABNORMAL LOW (ref 3.5–5.1)
Potassium: 4.1 meq/L (ref 3.5–5.1)
Potassium: 4.1 meq/L (ref 3.5–5.1)
Sodium: 124 meq/L — ABNORMAL LOW (ref 135–145)
Sodium: 126 meq/L — ABNORMAL LOW (ref 135–145)
Sodium: 127 meq/L — ABNORMAL LOW (ref 135–145)

## 2010-07-11 LAB — CBC
MCHC: 35 g/dL (ref 30.0–36.0)
Platelets: 279 10*3/uL (ref 150–400)
RDW: 12.1 % (ref 11.5–15.5)
WBC: 9.7 10*3/uL (ref 4.0–10.5)

## 2010-07-11 LAB — URINE CULTURE
Colony Count: NO GROWTH
Culture  Setup Time: 201205140001
Culture: NO GROWTH
Special Requests: NEGATIVE

## 2010-07-11 LAB — BASIC METABOLIC PANEL
CO2: 23 mEq/L (ref 19–32)
Chloride: 92 mEq/L — ABNORMAL LOW (ref 96–112)
Glucose, Bld: 141 mg/dL — ABNORMAL HIGH (ref 70–99)
Sodium: 125 mEq/L — ABNORMAL LOW (ref 135–145)

## 2010-07-11 LAB — OSMOLALITY, URINE: Osmolality, Ur: 374 mOsm/kg — ABNORMAL LOW (ref 390–1090)

## 2010-07-11 LAB — MAGNESIUM: Magnesium: 2.1 mg/dL (ref 1.5–2.5)

## 2010-07-12 LAB — CBC
MCV: 93.4 fL (ref 78.0–100.0)
Platelets: 280 10*3/uL (ref 150–400)
RBC: 3.61 MIL/uL — ABNORMAL LOW (ref 3.87–5.11)
RDW: 12.2 % (ref 11.5–15.5)
WBC: 14.9 10*3/uL — ABNORMAL HIGH (ref 4.0–10.5)

## 2010-07-12 LAB — BASIC METABOLIC PANEL
BUN: 6 mg/dL (ref 6–23)
CO2: 24 mEq/L (ref 19–32)
CO2: 29 mEq/L (ref 19–32)
Calcium: 7.6 mg/dL — ABNORMAL LOW (ref 8.4–10.5)
Calcium: 7.6 mg/dL — ABNORMAL LOW (ref 8.4–10.5)
Chloride: 94 mEq/L — ABNORMAL LOW (ref 96–112)
Chloride: 88 mEq/L — ABNORMAL LOW (ref 96–112)
Creatinine, Ser: <0.47 mg/dL (ref 0.4–1.2)
Glucose, Bld: 117 mg/dL — ABNORMAL HIGH (ref 70–99)
Potassium: 3.8 mEq/L (ref 3.5–5.1)
Potassium: 4.2 mEq/L (ref 3.5–5.1)
Sodium: 128 mEq/L — ABNORMAL LOW (ref 135–145)
Sodium: 122 mEq/L — ABNORMAL LOW (ref 135–145)

## 2010-07-12 LAB — CARDIAC PANEL(CRET KIN+CKTOT+MB+TROPI)
CK, MB: 19.5 ng/mL (ref 0.3–4.0)
Relative Index: 8.8 — ABNORMAL HIGH (ref 0.0–2.5)
Total CK: 221 U/L — ABNORMAL HIGH (ref 7–177)
Total CK: 218 U/L — ABNORMAL HIGH (ref 7–177)
Troponin I: <0.3 ng/mL (ref <0.30)

## 2010-07-12 LAB — GLUCOSE, CAPILLARY: Glucose-Capillary: 115 mg/dL — ABNORMAL HIGH (ref 70–99)

## 2010-07-12 LAB — MAGNESIUM: Magnesium: 2.2 mg/dL (ref 1.5–2.5)

## 2010-07-13 LAB — BASIC METABOLIC PANEL
CO2: 28 mEq/L (ref 19–32)
CO2: 25 mEq/L (ref 19–32)
Chloride: 90 mEq/L — ABNORMAL LOW (ref 96–112)
Chloride: 83 mEq/L — ABNORMAL LOW (ref 96–112)
Glucose, Bld: 101 mg/dL — ABNORMAL HIGH (ref 70–99)
Potassium: 4.5 mEq/L (ref 3.5–5.1)
Sodium: 125 mEq/L — ABNORMAL LOW (ref 135–145)
Sodium: 117 mEq/L — CL (ref 135–145)

## 2010-07-13 LAB — CBC
HCT: 34.9 % — ABNORMAL LOW (ref 36.0–46.0)
Hemoglobin: 11.5 g/dL — ABNORMAL LOW (ref 12.0–15.0)
MCH: 30.7 pg (ref 26.0–34.0)
RBC: 3.74 MIL/uL — ABNORMAL LOW (ref 3.87–5.11)

## 2010-07-14 LAB — BASIC METABOLIC PANEL
BUN: 8 mg/dL (ref 6–23)
BUN: 8 mg/dL (ref 6–23)
CO2: 28 mEq/L (ref 19–32)
CO2: 26 mEq/L (ref 19–32)
Chloride: 86 mEq/L — ABNORMAL LOW (ref 96–112)
Chloride: 84 mEq/L — ABNORMAL LOW (ref 96–112)
Creatinine, Ser: <0.47 mg/dL (ref 0.4–1.2)
Creatinine, Ser: <0.47 mg/dL (ref 0.4–1.2)
Glucose, Bld: 93 mg/dL (ref 70–99)
Potassium: 3.5 mEq/L (ref 3.5–5.1)
Potassium: 4 mEq/L (ref 3.5–5.1)
Potassium: 3.8 mEq/L (ref 3.5–5.1)
Sodium: 120 mEq/L — ABNORMAL LOW (ref 135–145)

## 2010-07-14 LAB — RENAL FUNCTION PANEL
Albumin: 2.6 g/dL — ABNORMAL LOW (ref 3.5–5.2)
BUN: 6 mg/dL (ref 6–23)
CO2: 27 mEq/L (ref 19–32)
Chloride: 87 mEq/L — ABNORMAL LOW (ref 96–112)
Glucose, Bld: 93 mg/dL (ref 70–99)
Potassium: 3.5 mEq/L (ref 3.5–5.1)

## 2010-07-14 LAB — URINE MICROSCOPIC-ADD ON

## 2010-07-14 LAB — URINALYSIS, ROUTINE W REFLEX MICROSCOPIC
Glucose, UA: NEGATIVE mg/dL
Leukocytes, UA: NEGATIVE
Protein, ur: NEGATIVE mg/dL
pH: 7.5 (ref 5.0–8.0)

## 2010-07-14 LAB — ALBUMIN: Albumin: 2.8 g/dL — ABNORMAL LOW (ref 3.5–5.2)

## 2010-07-14 LAB — OSMOLALITY, URINE: Osmolality, Ur: 361 mOsm/kg — ABNORMAL LOW (ref 390–1090)

## 2010-07-14 LAB — PHOSPHORUS: Phosphorus: 2.4 mg/dL (ref 2.3–4.6)

## 2010-07-14 LAB — OSMOLALITY: Osmolality: 247 mOsm/kg — ABNORMAL LOW (ref 275–300)

## 2010-07-14 NOTE — Op Note (Signed)
NAME:  [name], [name]                             ACCOUNT NO.:  [id number]   MEDICAL RECORD NO.:  [id number]                   PATIENT TYPE:  AMB   LOCATION:  DSC                                  FACILITY:  [facility]   PHYSICIAN:  Anselm Pancoast. Zachery Dakins, M.D.          DATE OF BIRTH:  12/06/1934   DATE OF PROCEDURE:  06/29/2002  DATE OF DISCHARGE:                                 OPERATIVE REPORT   PREOPERATIVE DIAGNOSIS:  Internal and external hemorrhoids.   OPERATION:  Internal and external hemorrhoidectomy.   ANESTHESIA:  General.   HISTORY:  This patient is a 76 year old female who appears younger than her  stated age.  She was referred to me by Dr. Karilyn Cota for symptomatic  hemorrhoids.  On exam, she has two large internal and external quadrants  both on the right, a smaller on the left, and I recommended a formal  internal and external hemorrhoidectomy to be performed.  She has had a  previous colonoscopy within the last few years.  She has loose bowel  movements following a cholecystectomy about four years ago by Dr.  Luan Pulling.  I gave her a bottle of laxative preoperatively which she took  yesterday  afternoon and says she has had frequent loose bowel movements.   DESCRIPTION OF PROCEDURE:  The patient was taken to the operating suite.  She was given a gram of Kefzol, positioned on the OR table, and inducted  first with an LOA tube, and then placed in lithotomy position.  She was  prepped with Betadine surgical solution and draped, but then when starting  to anesthetize the perianal area, she had a large liquid bowel movement on  all the instruments, etc.  Because of this, I recommended that we just take  her down and completely reprep her and drape her which was done.  I prepped  the internal portion of the rectum and sucked her out.  We also switched to  an endotracheal tube to get her level of anesthesia a little deeper.   After the patient had been redraped and I had prepped  here with Betadine  surgical scrub and solution, I finished anesthetizing the perianal area with  about 20 cc of 0.25% Marcaine with Adrenalin.  I then marked the hemorrhoids  that needed to be excised.  he has a fairly large internal and external  quadrant right anterior, right posterolateral, and then a smaller kind of  broad base on the left.  The external hemorrhoid was dissected.  The  internal sphincter was protected and after I was working above that, then  used a Buie clamp to underclamp the internal hemorrhoid.  The little  superficial vessels were ligated with fine chronic, the hemorrhoid removed,  and then the pedicle.  I put a couple of U stitches of 2-0 chromic and then  sutured starting at the apex, closing the internal and external incision.  The right posterior and also the left were then performed in a similar  manner.  The left was not as much of a hemorrhoid but a little bit on the  external portion and at the completion, her incisions are under no excessive  tension.  I inspected  all the suture lines, and there was no evidence of bleeding.  I placed some  Xylocaine ointment within the anal canal and a little gauze right in the  anal area.  Then 4 x 4's and stretch panties were placed, and she was  extubated and taken to the recovery room in stable postoperative condition.                                               Anselm Pancoast. Zachery Dakins, M.D.    WJW/MEDQ  D:  06/29/2002  T:  06/29/2002  Job:  [id number]   cc:   Wilson Singer, M.D.  [address]  [place]  [place] [address]  Fax: [phone]

## 2010-07-15 LAB — RENAL FUNCTION PANEL
BUN: 11 mg/dL (ref 6–23)
CO2: 28 mEq/L (ref 19–32)
Chloride: 87 mEq/L — ABNORMAL LOW (ref 96–112)
Creatinine, Ser: <0.47 mg/dL (ref 0.4–1.2)

## 2010-07-15 LAB — BASIC METABOLIC PANEL
BUN: 11 mg/dL (ref 6–23)
Chloride: 87 mEq/L — ABNORMAL LOW (ref 96–112)
Glucose, Bld: 88 mg/dL (ref 70–99)
Potassium: 4.3 mEq/L (ref 3.5–5.1)

## 2010-07-16 LAB — BASIC METABOLIC PANEL
BUN: 10 mg/dL (ref 6–23)
Chloride: 91 mEq/L — ABNORMAL LOW (ref 96–112)
Glucose, Bld: 137 mg/dL — ABNORMAL HIGH (ref 70–99)
Potassium: 4.1 mEq/L (ref 3.5–5.1)

## 2010-07-16 LAB — URINALYSIS, ROUTINE W REFLEX MICROSCOPIC
Glucose, UA: NEGATIVE mg/dL
Ketones, ur: NEGATIVE mg/dL
Leukocytes, UA: NEGATIVE
pH: 7 (ref 5.0–8.0)

## 2010-07-17 LAB — BASIC METABOLIC PANEL
Calcium: 8.6 mg/dL (ref 8.4–10.5)
Chloride: 89 mEq/L — ABNORMAL LOW (ref 96–112)
Creatinine, Ser: 0.47 mg/dL (ref 0.4–1.2)
GFR calc Af Amer: >60 mL/min (ref >60)
GFR calc non Af Amer: >60 mL/min (ref >60)

## 2010-07-17 LAB — CREATININE, URINE, RANDOM: Creatinine, Urine: 18.7 mg/dL

## 2010-07-17 LAB — SODIUM, URINE, RANDOM: Sodium, Ur: 60 mEq/L

## 2010-07-18 LAB — RENAL FUNCTION PANEL
Albumin: 2.8 g/dL — ABNORMAL LOW (ref 3.5–5.2)
CO2: 27 mEq/L (ref 19–32)
Calcium: 8.3 mg/dL — ABNORMAL LOW (ref 8.4–10.5)
GFR calc Af Amer: >60 mL/min (ref >60)
GFR calc non Af Amer: >60 mL/min (ref >60)
Phosphorus: 4 mg/dL (ref 2.3–4.6)
Sodium: 125 mEq/L — ABNORMAL LOW (ref 135–145)

## 2010-07-18 LAB — CREATININE, URINE, RANDOM: Creatinine, Urine: 75.8 mg/dL

## 2010-07-18 NOTE — Discharge Summary (Signed)
[name], [name]                   ACCOUNT NO.:  [id number]  MEDICAL RECORD NO.:  [id number]           PATIENT TYPE:  I  LOCATION:  1427                         FACILITY:  [facility]  PHYSICIAN:  Andreas Blower, MD       DATE OF BIRTH:  03-28-1934  DATE OF ADMISSION:  07/09/2010 DATE OF DISCHARGE:                              DISCHARGE SUMMARY   PRIMARY CARE PHYSICIAN:  Ralene Ok, M.D.  CARDIOLOGISTEduardo Osier Sharyn Lull, M.D.  DISCHARGE DIAGNOSES: 1. Hyponatremia secondary to ARB, thiazides and Wellbutrin. 2. Atrial fibrillation with rapid ventricular response. 3. Chronic obstructive pulmonary disease/bronchitis. 4. Peripheral edema. 5. Hypertension. 6. Hypothyroidism. 7. Nausea, vomiting, diarrhea, likely due to viral gastroenteritis,     resolved. 8. Acute delirium due to hyponatremia, resolved. 9. Hypokalemia. 10.Fibromyalgia. 11.Hypercholesterolemia. 12.Osteoporosis. 13.Status post cholecystectomy. 14.Status post cataract surgery. 15.Status post tubal ligation. 16.Generalized deconditioning.  DISCHARGE MEDICATIONS: 1. Amiodarone 200 mg p.o. twice daily. 2. Amlodipine 5 mg p.o. daily. 3. Atenolol 50 mg daily. 4. Dabigatran 150 mg p.o. twice daily. 5. Digoxin 0.125 mg p.o. daily. 6. Advair 250/50 1 puff inhaled twice daily. 7. Guaifenesin-DM 100/10 mg/5 mL every 4 hours as needed for cough. 8. Levalbuterol 0.63 mg inhaled 3 times a day as needed for shortness     of breath. 9. Magnesium oxide 400 mg p.o. twice daily. 10.Nutritional supplement 240 mg p.o. twice daily. 11.Prednisone 20 mg p.o. daily for 2 days, then 10 mg p.o. daily for 2     days, then 5 mg p.o. daily for 2 days, then discontinue. 12.Durezol 0.05% 1 drop in right eye twice daily. 13.Estradiol 1 mg p.o. twice daily. 14.Hydroxyzine 10 mg p.o. every 8 hours as needed for nausea. 15.Potassium chloride 20 mEq p.o. daily. 16.Levothyroxine 137 mcg p.o. daily. 17.Ativan 0.5 mg p.o. twice daily as needed for  anxiety. 18.Lyrica 50 mg p.o. daily as needed for pain. 19.Medroxyprogesterone 2.5 mg p.o. daily. 20.Multivitamin 1 tablet p.o. daily. 21.Cholestyramine 4 g p.o. daily. 22.Tylenol Extra Strength 1000 mg every 6 hours as needed. 23.Moxifloxacin 0.5 mg 1 drop in right eye twice daily.  BRIEF ADMITTING HISTORY AND PHYSICAL:  [name] is a 76 year old Caucasian female with history of COPD, hypothyroidism, hypertension, osteoporosis.  He presented from urgent care for hypernatremia evaluation.  CONSULTS:  Dr. Arrie Aran with Nephrology.  RADIOLOGY AND IMAGING:  The patient had chest x-ray, 2-view, on Jul 09, 2010 which showed hyperinflation without acute or superimposed abnormality.  LABORATORY DATA:  CBC shows a white count of 13.5, hemoglobin 11.5, hematocrit 34.9, platelet count 309,000.  Sodium 129, potassium 3.8, chloride 88, CO2 27, BUN 19, creatinine 0.51.  UA, negative for nitrites and leukocytes.  HOSPITAL COURSE BY PROBLEM: 1. Hyponatremia likely due to ARB that she was taking, thiazides and     Wellbutrin.  As her sodium was not improving, renal was consulted     and Dr. Arrie Aran evaluated the patient.  Her ARB, thiazides and     Wellbutrin was discontinued.  At the time of discharge, her sodium     had improved from 110 on admission.  At the time of discharge, I     spoke with Dr. Arrie Aran who indicated that as her sodium is     stable, the patient could be discharged with outpatient followup.     If sodium continues to be low, the patient could be referred to     renal clinic for further evaluation of hyponatremia. 2. AFIB with RVR.  During the hospital stay, the patient became     tachycardic.  Initially, the patient was started on diltiazem, as     her heart rate was not controlled.  Cardiology, Dr. Sharyn Lull, was     consulted.  She was started on amiodarone and converted to sinus     rhythm.  The patient was also started on dabigatran by Dr. Sharyn Lull,     further  management as an outpatient.  At the time of discharge, the     patient was in sinus rhythm. 3. COPD/bronchitis.  Initially on admission, the patient was placed on     a steroid taper.  She was started on breathing treatment.  Her     albuterol was transitioned to levalbuterol.  She was started on     ceftriaxone, azithromycin.  The patient completed a course of     antibiotics during the course of the hospital stay. 4. Peripheral edema.  Initially, the patient was hydrated aggressively     due to hyponatremia.  As a result, she had peripheral edema and she     was given few doses of Lasix. 5. Hypertension.  As the patient was taken off thiazides and ARB, the     patient was started on amlodipine.  Her blood pressure at the time     of discharge was 150/70.  Further titration of antihypertensive     medications to be done as an outpatient. 6. Hypothyroid.  Continued the patient on Synthroid. 7. Nausea, vomiting, diarrhea.  Initially during the course of the     hospital stay on admission, the patient had nausea, vomiting,     diarrhea which resolved likely due to viral gastroenteritis. 8. Hypokalemia, resolved, with replacement.  DISPOSITION AND FOLLOWUP:  The patient to follow with Dr. Ralene Ok, her primary care physician, in 1 to 2 weeks.  The patient will have labs checked this Friday, Jul 21, 2010 and will have the results sent to Dr. Ludwig Clarks.  The patient to follow up with Dr. Sharyn Lull on August 04, 2010 at 9:15 a.m.  Time spent on discharge, talking to the patient and consultants, coordinating care was 40 minutes.     Andreas Blower, MD     SR/MEDQ  D:  07/18/2010  T:  07/18/2010  Job:  [id number]  cc:   Ralene Ok, M.D. Fax: [phone]  Electronically Signed by Wardell Heath Rayland Hamed  on 07/18/2010 09:45:53 PM

## 2010-07-20 ENCOUNTER — Encounter: Payer: Self-pay | Admitting: Internal Medicine

## 2010-07-27 NOTE — Consult Note (Signed)
[name], [name]                   ACCOUNT NO.:  [id number]  MEDICAL RECORD NO.:  [id number]           PATIENT TYPE:  I  LOCATION:  1427                         FACILITY:  [facility]  PHYSICIAN:  Terrial Rhodes, M.D.DATE OF BIRTH:  July 30, 1934  DATE OF CONSULTATION:  07/13/2010 DATE OF DISCHARGE:                                CONSULTATION   CONSULTING PHYSICIAN:  Andreas Blower, MD  REASON FOR CONSULTATION:  Hyponatremia.  HISTORY OF PRESENT ILLNESS:  [name] is a 76 year old white female with past medical history significant for COPD, hypothyroidism, hypertension, and osteoporosis who presented with a 1-week history of weakness, malaise, nausea, vomiting, diarrhea and altered mental status.  She also had some cough and chest congestion and was started on Zithromax prior to her admission.  Her condition continued to worsen.  She went to Urgent Care, had labs drawn which were significant for serum sodium of 110 and was sent to the emergency department.  The patient was subsequently admitted, found to be volume depleted and on chlorthalidone.  This was discontinued.  She received IV fluids and her serum sodium did improve during her hospital stay from Jul 09, 2010, when it was 110 to 128 on Jul 11, 2010.  However, it dropped down to 120 on Jul 12, 2010 after she developed AFib with rapid ventricular rate and was hypotensive, started on amiodarone and Pradaxa.  Her sodium has increased to 125 today.  We were asked to help further evaluate and manage her recurrent hyponatremia.  Also of note, she is being treated for her COPD exacerbation with steroids, bronchitis, as well as taking levothyroxine and Micardis.  ALLERGIES:  Her allergies are PENICILLIN which causes swelling, CODEINE which causes nausea and vomiting.  PAST MEDICAL HISTORY: 1. COPD. 2. Hypertension. 3. Fibromyalgia. 4. Hypercholesterolemia. 5. Osteoporosis. 6. Hypothyroidism. 7. Status post cholecystectomy. 8.  Status post cataract surgery. 9. Status post tubal ligation.  OUTPATIENT MEDICATIONS: 1. Levothyroxine 135 mcg daily. 2. Atenolol/chlorthalidone 50/25 daily. 3. Micardis 80 mg daily. 4. Potassium chloride 20 mEq daily. 5. Medroxyprogesterone 2.5 mg daily. 6. Bupropion 100 mg daily. 7. Questran 4 g daily. 8. Multivitamin 1 daily. 9. Tylenol 500 mg p.r.n. 10.Lyrica 50 mg p.r.n. 11.Spiriva inhaler daily. 12.Advair Diskus daily. 13.ProAir HFA daily. 14.Lorazepam 0.5 mg p.r.n. 15.Prednisone 40 mg daily with a taper. 16.Hydroxyzine 10 mg p.r.n.  FAMILY HISTORY:  Noncontributory.  SOCIAL HISTORY:  She has been married twice, divorced both times. Currently lives alone.  Has one daughter and 2 sons.  Has a 30-pack year tobacco history, quit 7 years ago.  Quit alcohol 7 years ago.  No IV drug use.  Lives alone and is independent with her daily activities.  REVIEW OF SYSTEMS:  As per HPI.  GENERAL:  In general, she had some anorexia, malaise, fatigue prior to admission.  CARDIAC:  No chest pain, palpitation, orthopnea, PND.  PULMONARY:  Some shortness of breath, cough productive of whitish-yellow sputum, some dyspnea on exertion. GI:  Had nausea, vomiting, diarrhea prior to admission but none since admission.  GU:  No dysuria, pyuria, hematuria, urgency, frequency, retention.  NEUROLOGIC:  Has diffuse arthralgias or myalgias. DERMATOLOGIC:  No rashes, lumps or bumps.  HEMATOLOGIC:  No abnormal bleeding or bruising.  All other systems negative.  PHYSICAL EXAMINATION:  GENERAL:  This is a well-developed, chronically ill-appearing female, in no apparent distress. VITAL SIGNS:  Temperature is 97.4, pulse 53, blood pressure 154/78, respiratory rate is 22. HEENT:  Head normocephalic, atraumatic.  Extraocular muscles intact.  No icterus.  Oropharynx without lesions. NECK:  Supple.  No lymphadenopathy.  No bruits. LUNGS:  Have diminished air movement bilaterally with  end-expiratory wheezes. CARDIAC EXAM:  Irregular.  No precordial rub appreciated. ABDOMINAL EXAM:  Normoactive bowel sounds, soft, nontender, nondistended.  No guarding, no rebound, no bruits. EXTREMITIES:  She has trace pretibial edema in bilateral lower extremities.  She has 2+ presacral edema that is pitting.  She had 1+ pedal pulses bilaterally.  LABORATORY DATA:  Sodium 125, potassium 3.9, chloride 98, CO2 of 28, BUN 6, creatinine 0.47, glucose 101.  Calcium is 7.9.  Last albumin was 2.8 on admission.  White blood cell count of 13.5, hemoglobin 11.5, platelets 305.  Urine osmolality was 204.  Serum osmolality was 259. TSH was normal at 1.384.  ASSESSMENT/PLAN: 1. Hyponatremia.  Initially this was due to hypovolemia and volume     depletion in the setting of nausea, vomiting, diarrhea with     concomitant use of chlorthalidone and an ARB which improved with     volume expansion and cessation of chlorthalidone, however, she     developed atrial fibrillation with rapid ventricular rate and drop     in blood pressure and drop in her sodium but, however, this is     improving.  Her urine osmolality was not maximally dilute but lower     than serum osmolality.  So, this is an appropriate response from     her kidneys.  However, she may be unable to maximally dilute her     urine due to poor solute intake and malnutrition as evidenced by     her low phosphorous and albumin on admission.  In the meantime, she     has volume overload at this time with pitting edema.  Blood     pressures are elevated.  We will start Lasix 20 mg a day and follow     her serum sodium as well as her urine osmolality and serum     osmolality.  We will put her IV fluids to [facility] and we will follow. 2. Hypertension, as above blood pressure is elevated.  Will add loop     diuretic and follow. 3. Chronic obstructive pulmonary disease exacerbation, on steroid     taper. 4. Hypothyroidism, stable. 5. Weakness,  debilitation and deconditioning.  Will need PT and OT. 6. Atrial fibrillation, rate controlled at present.  Plan per Dr.     Sharyn Lull.  She is currently on Pradaxa for anticoagulation.  Will     continue to follow.  Thank you for this consultation.          ______________________________ Terrial Rhodes, M.D.     JC/MEDQ  D:  07/13/2010  T:  07/13/2010  Job:  [id number]  Electronically Signed by Terrial Rhodes M.D. on 07/27/2010 07:43:50 AM

## 2010-08-02 ENCOUNTER — Encounter: Payer: Self-pay | Admitting: Internal Medicine

## 2010-08-02 ENCOUNTER — Inpatient Hospital Stay (HOSPITAL_COMMUNITY): Payer: Medicare Other

## 2010-08-02 ENCOUNTER — Inpatient Hospital Stay (HOSPITAL_COMMUNITY)
Admission: EM | Admit: 2010-08-02 | Discharge: 2010-08-04 | DRG: 310 | Disposition: A | Discharge status: Home / Self Care | Payer: Medicare Other | Attending: Cardiology | Admitting: Cardiology

## 2010-08-02 ENCOUNTER — Ambulatory Visit (INDEPENDENT_AMBULATORY_CARE_PROVIDER_SITE_OTHER): Payer: Medicare Other | Admitting: Internal Medicine

## 2010-08-02 VITALS — BP 142/60 | HR 44 | Ht 63.0 in | Wt 138.2 lb

## 2010-08-02 DIAGNOSIS — T460X5A Adverse effect of cardiac-stimulant glycosides and drugs of similar action, initial encounter: Secondary | ICD-10-CM | POA: Diagnosis present

## 2010-08-02 DIAGNOSIS — E039 Hypothyroidism, unspecified: Secondary | ICD-10-CM | POA: Diagnosis present

## 2010-08-02 DIAGNOSIS — I4891 Unspecified atrial fibrillation: Secondary | ICD-10-CM | POA: Diagnosis present

## 2010-08-02 DIAGNOSIS — I498 Other specified cardiac arrhythmias: Secondary | ICD-10-CM

## 2010-08-02 DIAGNOSIS — J4489 Other specified chronic obstructive pulmonary disease: Secondary | ICD-10-CM | POA: Diagnosis present

## 2010-08-02 DIAGNOSIS — F17200 Nicotine dependence, unspecified, uncomplicated: Secondary | ICD-10-CM | POA: Diagnosis present

## 2010-08-02 DIAGNOSIS — R0602 Shortness of breath: Secondary | ICD-10-CM

## 2010-08-02 DIAGNOSIS — J449 Chronic obstructive pulmonary disease, unspecified: Secondary | ICD-10-CM | POA: Diagnosis present

## 2010-08-02 DIAGNOSIS — I10 Essential (primary) hypertension: Secondary | ICD-10-CM | POA: Diagnosis present

## 2010-08-02 DIAGNOSIS — I509 Heart failure, unspecified: Secondary | ICD-10-CM | POA: Diagnosis present

## 2010-08-02 DIAGNOSIS — R001 Bradycardia, unspecified: Secondary | ICD-10-CM

## 2010-08-02 LAB — HEPATIC FUNCTION PANEL
ALT: 18 U/L (ref 0–35)
AST: 16 U/L (ref 0–37)
Alkaline Phosphatase: 34 U/L — ABNORMAL LOW (ref 39–117)
Bilirubin, Direct: 0.1 mg/dL (ref 0.0–0.3)
Indirect Bilirubin: 0.2 mg/dL — ABNORMAL LOW (ref 0.3–0.9)
Total Bilirubin: 0.3 mg/dL (ref 0.3–1.2)

## 2010-08-02 LAB — CK TOTAL AND CKMB (NOT AT ARMC)
CK, MB: 3.4 ng/mL (ref 0.3–4.0)
Relative Index: INVALID (ref 0.0–2.5)
Total CK: 71 U/L (ref 7–177)

## 2010-08-02 LAB — LIPID PANEL: VLDL: 44 mg/dL — ABNORMAL HIGH (ref 0–40)

## 2010-08-02 LAB — BASIC METABOLIC PANEL
BUN: 15 mg/dL (ref 6–23)
CO2: 25 mEq/L (ref 19–32)
Glucose, Bld: 97 mg/dL (ref 70–99)
Potassium: 4.8 mEq/L (ref 3.5–5.1)
Sodium: 134 mEq/L — ABNORMAL LOW (ref 135–145)

## 2010-08-02 LAB — TSH: TSH: 12.8 u[IU]/mL — ABNORMAL HIGH (ref 0.350–4.500)

## 2010-08-02 LAB — PROTIME-INR: Prothrombin Time: 22.5 seconds — ABNORMAL HIGH (ref 11.6–15.2)

## 2010-08-02 LAB — CARDIAC PANEL(CRET KIN+CKTOT+MB+TROPI)
CK, MB: 3.9 ng/mL (ref 0.3–4.0)
Relative Index: INVALID (ref 0.0–2.5)
Total CK: 75 U/L (ref 7–177)
Troponin I: <0.3 ng/mL (ref <0.30)

## 2010-08-02 LAB — CBC
HCT: 38.7 % (ref 36.0–46.0)
Hemoglobin: 12.6 g/dL (ref 12.0–15.0)
MCV: 97.5 fL (ref 78.0–100.0)
WBC: 7.5 10*3/uL (ref 4.0–10.5)

## 2010-08-02 LAB — TROPONIN I: Troponin I: <0.3 ng/mL (ref <0.30)

## 2010-08-02 LAB — DIGOXIN LEVEL: Digoxin Level: 1 ng/mL (ref 0.8–2.0)

## 2010-08-02 LAB — MRSA PCR SCREENING: MRSA by PCR: NEGATIVE

## 2010-08-02 NOTE — Progress Notes (Signed)
  Subjective:    Patient ID: [name], female    DOB: 23-Mar-1934, 76 y.o.   MRN: [id number]  HPI 08/02/10- 30 yoF former smoker followed for dyspnea, COPD,  Complicated by hypothyroid, depression, HBP. Last here January 31, 2010- note reviewed. Here with son. Just out of hospital for hyponatremia and bronchitis. We discussed early return to her PCP Dr Ludwig Clarks. She was fine coming out of the hospital, while on prednisone. Son thinks being off prednisone is associated with her sense of fatigue.  Nebs helped in hospital- we compared her current meds.  Discussed her slow heart rate- she has appt in 2 days with Dr Sharyn Lull.  Denies dizzy, syncopal Review of Systems Constitutional:   No weight loss, night sweats,  Fevers, chills, fatigue, lassitude. HEENT:   No headaches,  Difficulty swallowing,  Tooth/dental problems,  Sore throat,                No sneezing, itching, ear ache, nasal congestion, post nasal drip,   CV:  No chest pain,  Orthopnea, PND, swelling in lower extremities, anasarca, dizziness, palpitations  GI  No heartburn, indigestion, abdominal pain, nausea, vomiting, diarrhea, change in bowel habits, loss of appetite  Resp: No shortness of breath with exertion or at rest.  No excess mucus, no productive cough,  No non-productive cough,  No coughing up of blood.  No change in color of mucus.  No wheezing.   FEELS weak/ washed out  Skin: no rash or lesions.  GU: no dysuria, change in color of urine, no urgency or frequency.  No flank pain.  MS:  No joint pain or swelling.  No decreased range of motion.  No back pain.  Psych:  No change in mood or affect. No depression or anxiety.  No memory loss.      Objective:   Physical Exam General- Alert, Oriented, Affect-appropriate, Distress- none acute  Calm and alert   Skin- rash-none, lesions- none, excoriation- none  Lymphadenopathy- none  Head- atraumatic  Eyes- Gross vision intact, PERRLA, conjunctivae clear secretions  Ears-  Hearing, canals, Tm - normal  Nose- Clear, No-Septal dev, mucus, polyps, erosion, perforation   Throat- Mallampati II , mucosa clear , drainage- none, tonsils- atrophic  Neck- flexible , trachea midline, no stridor , thyroid nl, carotid no bruit  Chest - symmetrical excursion , unlabored     Heart/CV- Very slow / 30/min w/ occ early beat                   , no murmur , no gallop  , no rub, nl s1 s2                     - JVD- none , edema- none, stasis changes- none, varices- none     Lung- clear to P&A, wheeze- none, cough- none , dullness-none, rub- none     Chest wall-   Abd- tender-no, distended-no, bowel sounds-present, HSM- no  Br/ Gen/ Rectal- Not done, not indicated  Extrem- cyanosis- none, clubbing, none, atrophy- none, strength- nl  Neuro- grossly intact to observation         Assessment & Plan:

## 2010-08-02 NOTE — Patient Instructions (Addendum)
EKG- for dx bradycardia- done- sinus brady and junctional beats

## 2010-08-02 NOTE — Assessment & Plan Note (Addendum)
Recently discharged on amiodarone, tenormin and dig. Had a new appointment in 2 days with Dr Sharyn Lull, but he has never seen her in office before.  BP is ok and she denies presyncope, but on amiodarone and dig her rate is too slow - EKG I am sending to ER.

## 2010-08-02 NOTE — Assessment & Plan Note (Signed)
Clear on exam and not experiencing dyspnea today- just "weak" consistent with cardiac status.

## 2010-08-03 LAB — CARDIAC PANEL(CRET KIN+CKTOT+MB+TROPI): Total CK: 67 U/L (ref 7–177)

## 2010-08-17 NOTE — Discharge Summary (Signed)
NAMECAYTLYN, EVERS                   ACCOUNT NO.:  [id number]  MEDICAL RECORD NO.:  [id number]  LOCATION:  1437                         FACILITY:  [facility]  PHYSICIAN:  Eduardo Osier. Sharyn Lull, M.D. DATE OF BIRTH:  11/01/1934  DATE OF ADMISSION:  08/02/2010 DATE OF DISCHARGE:  08/04/2010                              DISCHARGE SUMMARY   ADMITTING DIAGNOSES: 1. Symptomatic bradycardia, probably secondary to medication, rule out     coronary insufficiency. 2. Hypertension. 3. Chronic obstructive pulmonary disease. 4. History of recent atrial fibrillation with rapid ventricular     response. 5. History of tobacco abuse. 6. History of hyponatremia. 7. Hypothyroidism.  DISCHARGE DIAGNOSES: 1. Status post symptomatic bradycardia secondary to medication.     Myocardial infarction ruled out. 2. Hypertension. 3. Status post recent atrial fibrillation with rapid ventricular     response. 4. Chronic obstructive pulmonary disease. 5. History of tobacco abuse. 6. Hypothyroidism. 7. History of recent hyponatremia.  DISCHARGE HOME MEDICATIONS: 1. Levoxyl 150 mcg 1 tablet daily. 2. Amiodarone 200 mg 1 tablet daily. 3. Advair Diskus 250/50 1 puff twice daily. 4. Albuterol inhaler 1 puff daily. 5. Amlodipine 5 mg 1 tablet daily. 6. Estrace 1 mg 1 tablet daily as before. 7. Klor-Con 20 mEq 2 tablets daily as before. 8. Lorazepam 0.5 mg 1 tablet twice daily as before. 9. Lyrica one capsule 50 mg as needed. 10.Magnesium oxide 400 mg 1 tablet twice daily as before. 11.Medroxyprogesterone 2.5 mg 1 tablet daily as before. 12.Multivitamin 1 tablet daily as before. 13.Pradaxa 150 mg 1 capsule every 12 hours. 14.Prednisone taper 5 mg as before. 15.Questran 4 g 1 packet daily. 16.Resource Breeze 237 mL daily as before. 17.Robitussin DM q.4 hours as needed as before. 18.Xopenex 0.6 mg 1 puff 2 times daily as needed.  The patient has been advised to stop her atenolol and digoxin and continue the rest of  the home medications as before.  Also her levothyroxine dose has been increased from 137 mcg to 150 mcg.  DIET:  Low-salt, low-cholesterol.  ACTIVITY:  Increase activity slowly as tolerated.  FOLLOWUP:  Follow up with me in one week and follow up with Dr. Maple Hudson and her PMD as scheduled.  CONDITION ON DISCHARGE:  Condition at discharge is stable.  BRIEF HISTORY AND HOSPITAL COURSE:  Ms. Sinning is a 76 year old white female with past medical history significant for hypertension, COPD, history of atrial fibrillation with RVR, hypothyroidism, history of tobacco abuse, history of hyponatremia, and degenerative joint disease who complained of generalized weakness with no energy for the last few days.  She went to see Dr. Maple Hudson and was noted to have marked sinus bradycardia with junctional escape rhythm, associated with nausea, associated with narrow QRS complex with heart rate in the 30s.  Denies any chest pain, nausea, vomiting or diaphoresis.  Denies lightheadedness or syncope.  The patient recently was discharged from the hospital, seen as a consult for atrial fibrillation with RVR and the patient was discharged on atenolol, amiodarone, and digoxin.  The patient denies any shortness of breath, cough, fever, or chills.  Denies any visual disturbances.  Heart rate has slightly improved since this morning when  seen in the ER.  PAST MEDICAL HISTORY:  As above.  PAST SURGICAL HISTORY:  She had cholecystectomy in the past.  She had hemorrhoidectomy in the past and had tubal ligation in the past.  ALLERGIES:  She is allergic to PENICILLIN and CODEINE.  MEDICATIONS AT HOME:  She was on: 1. Amiodarone 200 mg p.o. daily. 2. Amlodipine 5 mg p.o. daily. 3. Atenolol 60 mg p.o. daily. 4. Pradaxa 150 mg p.o. b.i.d. 5. Digoxin 0.125 mg p.o. daily. 6. Advair. 7. Xopenex inhalers. 8. Levothyroxine 137 mcg daily. 9. Lorazepam 0.25 mg b.i.d. 10.Lyrica 50 mg p.o. daily. 11.Questran 4 g 1 packet  daily.  SOCIAL HISTORY:  She is single and has three children.  She smoked more than a half pack per day for 50 plus years.  She quit 7 years ago.  She used to drink in moderation in the past.  FAMILY HISTORY:  Family history is noncontributory.  PHYSICAL EXAMINATION:  GENERAL:  On examination she is alert, awake and oriented x3, in no acute distress. VITAL SIGNS:  Blood pressure was 144/52, pulse was 40 to 42, marked sinus bradycardia on the monitor with occasional junctional escape. HEENT:  Conjunctivae was pink. NECK:  Supple.  No JVD.  No bruit. LUNGS:  Decreased breath sounds at the bases.  There was no wheezing. CARDIOVASCULAR EXAMINATION:  S1-S2 was normal.  There was soft systolic murmur.  No S3 gallop. ABDOMEN:  Soft.  Bowel sounds were present, nontender. EXTREMITIES:  There was no clubbing or cyanosis.  There was nonpitting edema.  LABORATORY DATA:  Hemoglobin was 12.6, hematocrit 38.7, white count of 7.5.  Sodium was 134, potassium 4.8, glucose 97, BUN 15, creatinine 0.66.  Her digoxin level was 1.0.  Two sets of cardiac enzymes were negative.  Cholesterol was 155, LDL 66, HDL 45, triglycerides were slightly elevated at 221.  Her TSH was elevated at 12.80.  RADIOLOGIC:  Chest x-ray showed probable COPD.  No active lung disease.  BRIEF HOSPITAL COURSE:  The patient was admitted to CCU.  Her digoxin, atenolol and amiodarone were held with gradual improvement in her heart rate.  The patient remained asymptomatic during the hospital stay.  The patient did not have any further episodes of bradycardia.  The patient denied any chest pain during the hospital stay.  The patient's TSH level was elevated.  Her levothyroxine dose has been increased.  The patient did not have any further episodes of marked sinus bradycardia.  We will discontinue her atenolol and digoxin and continue with low-dose amiodarone.  The patient will be followed up in my office next week.  If she  continues to have episodes of bradycardia, we may have to reduce the dose of amiodarone further.     Eduardo Osier. Sharyn Lull, M.D.     MNH/MEDQ  D:  08/04/2010  T:  08/04/2010  Job:  [id number]  cc:   Joni Fears D. Maple Hudson, MD, FCCP, FACP [facility] [address] [place] [address]  Eduardo Osier. Sharyn Lull, M.D. Fax: [phone]  Electronically Signed by Rinaldo Cloud M.D. on 08/17/2010 10:04:23 AM

## 2010-09-22 ENCOUNTER — Telehealth: Payer: Self-pay | Admitting: Internal Medicine

## 2010-09-22 NOTE — Telephone Encounter (Signed)
Called, spoke with pt.  She is in the donut hole and requesting samples of advair 250/50 and proair.  2 samples of advair 250/50 and 1 sample proair left at front for pick up along with a savings coupon for advair -- pt aware and was very thankful for this.  Advair Lot # [id number], Exp Date 09/2011  Proair Lot # [id number], Exp Date 08/2011

## 2010-09-25 NOTE — H&P (Signed)
[name], [name]                   ACCOUNT NO.:  [id number]  MEDICAL RECORD NO.:  [id number]           PATIENT TYPE:  E  LOCATION:  WLED                         FACILITY:  [facility]  PHYSICIAN:  Brandi Armato I Kasarah Sitts, MD      DATE OF BIRTH:  10-31-1934  DATE OF ADMISSION:  07/09/2010 DATE OF DISCHARGE:                             HISTORY & PHYSICAL   PRIMARY CARE PHYSICIAN:  Ralene Ok, MD  CHIEF COMPLAINT:  "The patient sent from Urgent Care for evaluation of hyponatremia."  HISTORY OF PRESENT ILLNESS:  This is a 76 year old Caucasian female with a history of COPD, hypothyroidism, hypertension, and osteoporosis.  Her symptoms have started a week ago when she started developing cough and chest congestion.  She denies any fever or chills at that time.  She called Dr. Ralene Ok and his office on Friday, where he prescribed Zithromax and the patient took 2 tablets of Zithromax, then started feeling nauseated having nausea, vomiting of clear liquid diet, and diarrhea.  Per son, she could have 4-5 bouts of diarrhea and vomiting, but because the patient stay alone, family at bedside cannot exactly recall how many nausea and vomiting and diarrhea she had.  Saturday, she felt really weak and dizzy.  She went to Urgent Care where Zithromax was discontinued and the patient was placed on prednisone and hydroxyzine. Today, she received a call from Urgent Care that her sodium was 110 and was advised to present to the emergency room.  Per family, the patient is not really coherent, but she is awake, alert, and oriented.  She cannot really recall what exactly happened.  The son at that time, he confirmed the current symptoms.  On the emergency room, the patient found to have sodium of 110 and potassium of 3.2.  The hospitalist service were asked to evaluate for admission.  PAST MEDICAL HISTORY: 1. COPD. 2. Fibromyalgia. 3. Hypothyroidism. 4. Hypercholesterolemia. 5. Osteoporosis. 6.  Hypertension.  PAST SURGICAL HISTORY: 1. History of cholecystectomy. 2. Cataract surgery. 3. Tubal ligation/hysterectomy.  MEDICATIONS: 1. Levothyroxine 137 mcg p.o. daily. 2. Micardis. 3. K-Dur. 4. Atenolol/chlorthalidone. 5. Estradiol. 6. __________. 7. Questran. 8. Multivitamins. 9. Tylenol. 10.Lyrica. 11.Spiriva. 12.Ativan. 13.ProAir. 14.Lorazepam. 15.Prednisone. 16.Hydroxyzine.  FAMILY HISTORY:  The patient is adopted and no further history was obtainable.  SOCIAL HISTORY:  She lives alone and she is active with daily living activities.  Son lives nearby.  She quit smoking more than 7 years ago. Drinks alcohol occasionally.  No drugs and she is a widow.  Has 2 grown children.  REVIEW OF SYSTEMS:  A systemic review was obtained.  The patient currently denies any blurring of vision.  Denies any headache.  Denies any sore throat.  Denies any numbness or weakness of her extremities. Denies any abnormal rash.  Denies any seizure activity.  Denies any nausea and vomiting, last vomiting was yesterday around 3 p.m.  Last bowel movement was 3 p.m. yesterday.  The patient denies any abdominal pain.  Denies any perineal micturition.  The patient denies any numbness or tingling.  She had a history of baseline neuropathy.  Denies any back pain on examination.  PHYSICAL EXAMINATION:  VITAL SIGNS:  Temperature 97.5, blood pressure 173/77, pulse rate 66, respiratory rate 20, and saturation 94% on room air. HEENT:  Normocephalic, atraumatic.  Pupils equal, reactive to light and accommodation.  Mucosa mildly dry.  No tongue swelling. NECK:  No carotid bruits.  No thyromegaly.  No masses. HEART:  S1 and S2.  There is no added sound. LUNGS:  Normal vesicular breathing with equal air entry. ABDOMEN:  Soft, nontender.  Surgical scar well-healed. LOWER EXTREMITIES:  Without edema and peripheral pulses intact. CNS:  The patient is awake, alert, oriented.  Also, sluggish,  not responding.  Moves all extremities.  BLOOD WORKUP:  Hemoglobin 14.6, hematocrit 43.  Sodium 110, potassium 3.3, chloride 77, glucose 140, BUN 13, creatinine 0.6.  CBC, white blood cells 8.7.  Hemoglobin 13.4, hematocrit 36.4, platelets 348.  EKG, normal sinus rhythm.  Chest x-ray, hyperinflation over the lung, no acute event.  ASSESSMENT AND PLAN: 1. Severe hyponatremia, likely a combination of nausea, vomited, and     dehydration in addition to medication, mainly chlorthalidone, which     is a Sabala acting hydrochlorothiazide.  We will get urine     osmolality, serum osmolality, and a urine sodium.  We will start     the patient on normal saline at 125 cc/hour.  Our goal is to     correct sodium at less than 10to 15 millimoles per 24Hour. 2. For hypokalemia, we will correct with potassium chloride with IV     fluid.  For hypertension , we will continue with atenolol and     Micardis.  We will add clonidine as p.r.n. if blood pressures above     170. 3. Chronic obstructive pulmonary disease.  The patient currently is     not actively wheezing, but continued to be coughing, possibility of     viral bronchitis versus bacterial bronchitis.  We will continue     with nebs.  I do not think the patient require any form of steroid     at this time.  We will add Avelox and nebs treatment. 4. Nausea, vomiting, and diarrhea, likely related to viral     gastroenteritis and also could be secondary to Zithromax treatment.     I will hold on any stool studies at this time.  We will follow the     patient.  If persist, we will ask for stool studies. 5. Hypothyroidism.  We will check thyroid stimulating hormone and we     will continue her home medications.  For the next other home     medications, we will continue as appropriate. 6. Deep venous thrombosis and gastrointestinal prophylaxis.  Further     recommendations as hospital course progressed.  Time spent on coordinating admitting is 1  hour.     Shiasia Porro Bosie Helper, MD     HIE/MEDQ  D:  07/09/2010  T:  07/09/2010  Job:  [id number]  cc:   Ralene Ok, M.D. Fax: [phone]  Electronically Signed by Ebony Cargo MD on 09/25/2010 02:31:33 PM

## 2010-11-02 ENCOUNTER — Telehealth: Payer: Self-pay | Admitting: Internal Medicine

## 2010-11-02 ENCOUNTER — Other Ambulatory Visit: Payer: Self-pay | Admitting: Internal Medicine

## 2010-11-02 DIAGNOSIS — Z1231 Encounter for screening mammogram for malignant neoplasm of breast: Secondary | ICD-10-CM

## 2010-11-02 NOTE — Telephone Encounter (Signed)
Spoke with pt. She is requesting samples of advair and proair and these have been left up front. Also states at last ov with June we sent her to ER and she did not know when CDY wanted to to f/u here again. She does not feel like needs to be seen any time soon, doing well. Please advise on next f/u thanks!

## 2010-11-02 NOTE — Telephone Encounter (Signed)
Pt is aware and has been scheduled for Tues., 9/11 @ 10am w/ CDy.

## 2010-11-02 NOTE — Telephone Encounter (Signed)
Per CY-she should have a routine OV-where next open slot is.

## 2010-11-03 NOTE — Telephone Encounter (Signed)
Duplicate msg.

## 2010-11-07 ENCOUNTER — Ambulatory Visit (INDEPENDENT_AMBULATORY_CARE_PROVIDER_SITE_OTHER): Payer: Medicare Other | Admitting: Internal Medicine

## 2010-11-07 ENCOUNTER — Encounter: Payer: Self-pay | Admitting: Internal Medicine

## 2010-11-07 VITALS — BP 174/102 | HR 78 | Ht 63.0 in | Wt 142.6 lb

## 2010-11-07 DIAGNOSIS — J449 Chronic obstructive pulmonary disease, unspecified: Secondary | ICD-10-CM

## 2010-11-07 NOTE — Assessment & Plan Note (Signed)
For flu vax by PCP We will get PFT and 6 MWT She will go from here to PCP for BP check.

## 2010-11-07 NOTE — Progress Notes (Signed)
Subjective:    Patient ID: [name], female    DOB: January 14, 1935, 76 y.o.   MRN: [id number]  HPI    Review of Systems     Objective:   Physical Exam        Assessment & Plan:   Subjective:    Patient ID: [name], female    DOB: 1934/06/14, 76 y.o.   MRN: [id number]  HPI 08/02/10- 64 yoF former smoker followed for dyspnea, COPD,  Complicated by hypothyroid, depression, HBP. Last here January 31, 2010- note reviewed. Here with son. Just out of hospital for hyponatremia and bronchitis. We discussed early return to her PCP Dr Ludwig Clarks. She was fine coming out of the hospital, while on prednisone. Son thinks being off prednisone is associated with her sense of fatigue.  Nebs helped in hospital- we compared her current meds.  Discussed her slow heart rate- she has appt in 2 days with Dr Sharyn Lull.  Denies dizzy, syncope.  11/07/10-  50 yoF former smoker followed for dyspnea, COPD,  Complicated by hypothyroid, depression, HBP, Hx PAfib. Stressed- daughter died in 2022/09/25- aged 32- bowel obstruction. Last visit here she was sent to hosp for 2 days w/ AFib.  Breathing usually ok in [facility], but easily dyspneic outdoors. Daily nap. Denies daily cough- scant clear. Denies chest pain, palpitation.  She quit smoking in 2006. Pulmonary rehab did help, but walking very little since.  Declines flu vax- will get from Dr Ludwig Clarks.   CXR-07/09/2010 hyperinflation but no active disease. Atherosclerosis noted in the aorta. Review of Systems Constitutional:   No weight loss, night sweats,  Fevers, chills, fatigue, lassitude. HEENT:   No headaches,  Difficulty swallowing,  Tooth/dental problems,  Sore throat,                No sneezing, itching, ear ache, nasal congestion, post nasal drip,  CV:  No chest pain,  Orthopnea, PND, swelling in lower extremities, anasarca, dizziness, palpitations GI  No heartburn, indigestion, abdominal pain, nausea, vomiting, diarrhea, change in bowel habits, loss of  appetite Resp:As per HPI.  No excess mucus, no productive cough,  No non-productive cough,  No coughing up of blood.  No change in color of mucus.  No wheezing.   FEELS weak/ washed out Skin: no rash or lesions. GU: no dysuria, change in color of urine, no urgency or frequency.  No flank pain. MS:  No joint pain or swelling.  No decreased range of motion.  No back pain. Psych:  No change in mood or affect. No depression or anxiety.  No memory loss.      Objective:   Physical Exam General- Alert, Oriented, Affect-appropriate, Distress- none acute Skin- rash-none, lesions- none, excoriation- none Lymphadenopathy- none Head- atraumatic            Eyes- Gross vision intact, PERRLA, conjunctivae clear secretions            Ears- Hearing, canals            Nose- Clear, Septal dev, mucus, polyps, erosion, perforation             Throat- Mallampati II , mucosa clear , drainage- none, tonsils- atrophic Neck- flexible , trachea midline, no stridor , thyroid nl, carotid no bruit Chest - symmetrical excursion , unlabored           Heart/CV- RRR , no murmur , no gallop  , no rub, nl s1 s2                           -  JVD- none , edema- none, stasis changes- none, varices- none           Lung- clear to P&A, wheeze- none, cough- none , dullness-none, rub- none           Chest wall-  Abd- tender-no, distended-no, bowel sounds-present, HSM- no Br/ Gen/ Rectal- Not done, not indicated Extrem- cyanosis- none, clubbing, none, atrophy- none, strength- nl Neuro- grossly intact to observation      Assessment & Plan:

## 2010-11-07 NOTE — Patient Instructions (Signed)
Please call as needed  Order- PFT,   Dx COPD  Please go over to Dr Ludwig Clarks from here to get your BP checked.

## 2010-12-08 ENCOUNTER — Ambulatory Visit
Admission: RE | Admit: 2010-12-08 | Discharge: 2010-12-08 | Disposition: A | Discharge status: Home / Self Care | Payer: Medicare Other | Source: Ambulatory Visit | Attending: Internal Medicine | Admitting: Internal Medicine

## 2010-12-08 DIAGNOSIS — Z1231 Encounter for screening mammogram for malignant neoplasm of breast: Secondary | ICD-10-CM

## 2010-12-12 ENCOUNTER — Telehealth: Payer: Self-pay | Admitting: Internal Medicine

## 2010-12-12 NOTE — Telephone Encounter (Signed)
Called, spoke with pt.  She is requesting samples of advair 250/50 and proair.  1 sample of each left at front for pick up -- pt aware.   Advair 250/50 lot # [id number], Exp Date 12/2011 Proair lot # [id number], Exp Date 08/2011

## 2011-01-15 ENCOUNTER — Telehealth: Payer: Self-pay | Admitting: Internal Medicine

## 2011-01-15 NOTE — Telephone Encounter (Signed)
ATC NA unable to leave vm wcb 

## 2011-01-16 NOTE — Telephone Encounter (Signed)
Pt has hit the "doughnut hole" until the first of the year and cannot afford the $125 copay for her Advair. Per Florentina Addison, okay to leave a sample for the pt to pick up.  Also a form for assistance for [facility] pt's in the doughnut hole was placed in the sample bag. Pt aware.

## 2011-05-07 ENCOUNTER — Ambulatory Visit: Payer: Medicare Other | Admitting: Internal Medicine

## 2011-05-07 ENCOUNTER — Ambulatory Visit (INDEPENDENT_AMBULATORY_CARE_PROVIDER_SITE_OTHER): Payer: Medicare Other | Admitting: Internal Medicine

## 2011-05-07 ENCOUNTER — Encounter: Payer: Self-pay | Admitting: Internal Medicine

## 2011-05-07 VITALS — BP 200/98 | HR 63 | Ht 63.0 in | Wt 147.0 lb

## 2011-05-07 DIAGNOSIS — J4489 Other specified chronic obstructive pulmonary disease: Secondary | ICD-10-CM

## 2011-05-07 DIAGNOSIS — J449 Chronic obstructive pulmonary disease, unspecified: Secondary | ICD-10-CM

## 2011-05-07 DIAGNOSIS — I10 Essential (primary) hypertension: Secondary | ICD-10-CM

## 2011-05-07 LAB — PULMONARY FUNCTION TEST

## 2011-05-07 NOTE — Progress Notes (Signed)
Patient ID: [name], female    DOB: 21-Dec-1934, 77 y.o.   MRN: [id number]  HPI 08/02/10- 40 yoF former smoker followed for dyspnea, COPD,  Complicated by hypothyroid, depression, HBP. Last here January 31, 2010- note reviewed. Here with son. Just out of hospital for hyponatremia and bronchitis. We discussed early return to her PCP Dr Ludwig Clarks. She was fine coming out of the hospital, while on prednisone. Son thinks being off prednisone is associated with her sense of fatigue.  Nebs helped in hospital- we compared her current meds.  Discussed her slow heart rate- she has appt in 2 days with Dr Sharyn Lull.  Denies dizzy, syncope.  11/07/10-  29 yoF former smoker followed for dyspnea, COPD,  Complicated by hypothyroid, depression, HBP, Hx PAfib. Stressed- daughter died in 10-19-22- aged 46- bowel obstruction. Last visit here she was sent to hosp for 2 days w/ AFib.  Breathing usually ok in [facility], but easily dyspneic outdoors. Daily nap. Denies daily cough- scant clear. Denies chest pain, palpitation.  She quit smoking in 2006. Pulmonary rehab did help, but walking very little since.  Declines flu vax- will get from Dr Ludwig Clarks.  CXR-07/09/2010 hyperinflation but no active disease. Atherosclerosis noted in the aorta.  05/07/11- 85 yoF former smoker followed for dyspnea, COPD,  Complicated by hypothyroid, depression, HBP. Good winter with no significant respiratory infections. Denies routine cough or wheeze. She has done pulmonary rehabilitation in the past but is not regularly exercising now. She cleans her house one small project at a time and paces herself generally. We expressed concern about her blood pressure and she says it runs around 137 systolic at home. PFT-05/07/2011-severe obstructive airways disease with insignificant response to bronchodilator, air trapping. Diffusion is moderately reduced. FEV1 0.76/42%, FEV1/FVC 0.41.  Review of Systems-see HPI Constitutional:   No weight loss, night sweats,   Fevers, chills, fatigue, lassitude. HEENT:   No headaches,  Difficulty swallowing,  Tooth/dental problems,  Sore throat,                No sneezing, itching, ear ache, nasal congestion, post nasal drip,  CV:  No chest pain,  Orthopnea, PND, swelling in lower extremities, anasarca, dizziness, palpitations GI  No heartburn, indigestion, abdominal pain, nausea, vomiting,  Resp: As per HPI.  No excess mucus, no productive cough,  No non-productive cough,  No coughing up of blood.  No change in color of mucus.  No wheezing.    Skin: no rash or lesions. GU: no dysuria,  MS:  No joint pain or swelling.  Psych:  No change in mood or affect. No depression or anxiety.  No memory loss.      Objective:   Physical Exam General- Alert, Oriented, Affect-appropriate, Distress- none acute Skin- rash-none, lesions- none, excoriation- none Lymphadenopathy- none Head- atraumatic            Eyes- Gross vision intact, PERRLA, conjunctivae clear secretions            Ears- Hearing, canals            Nose- Clear, Septal dev, mucus, polyps, erosion, perforation             Throat- Mallampati II , mucosa clear , drainage- none, tonsils- atrophic Neck- flexible , trachea midline, no stridor , thyroid nl, carotid no bruit Chest - symmetrical excursion , unlabored           Heart/CV- RRR , no murmur , no gallop  , no rub, nl s1  s2                           - JVD- none , edema- none, stasis changes- none, varices- none           Lung- very distant, unlabored , wheeze- none, cough- none , dullness-none, rub- none           Chest wall-  Abd- Br/ Gen/ Rectal- Not done, not indicated Extrem- cyanosis- none, clubbing, none, atrophy- none, strength- nl Neuro- grossly intact to observation

## 2011-05-07 NOTE — Patient Instructions (Addendum)
Sample Tudorza inhaler- 1 puff twice daily. See if it helps with the shortness of breath  Continue your other medicines as before  Consider calling the Pulmonary Rehab program about doing that again.  Please get in touch with Dr. Ludwig Clarks promptly for attention to your blood pressure as discussed.

## 2011-05-07 NOTE — Progress Notes (Signed)
PFT done today. 

## 2011-05-10 NOTE — Assessment & Plan Note (Signed)
Uncontrolled hypertension. She insists this is "white coat" hypertension and that her pressure is usually controlled at home. She is directed to get back with Dr. Ludwig Clarks promptly.

## 2011-05-10 NOTE — Assessment & Plan Note (Signed)
Severe emphysema. She is pacing herself which is appropriate. Plan- sample Tudorza 1 puff twice daily. Consider return to pulmonary rehabilitation.

## 2011-06-04 ENCOUNTER — Telehealth: Payer: Self-pay | Admitting: Internal Medicine

## 2011-06-04 DIAGNOSIS — J449 Chronic obstructive pulmonary disease, unspecified: Secondary | ICD-10-CM

## 2011-06-04 NOTE — Telephone Encounter (Signed)
Spoke with patient-she would like to have order placed to start Pulmonary rehab again; I have placed the order and pt is aware there is a 4-6 week waiting period for rehab. Pt understood.

## 2011-08-14 ENCOUNTER — Encounter (HOSPITAL_COMMUNITY)
Admission: RE | Admit: 2011-08-14 | Discharge: 2011-08-14 | Disposition: A | Discharge status: Home / Self Care | Payer: Self-pay | Source: Ambulatory Visit | Attending: Internal Medicine | Admitting: Internal Medicine

## 2011-08-14 DIAGNOSIS — R0609 Other forms of dyspnea: Secondary | ICD-10-CM | POA: Insufficient documentation

## 2011-08-14 DIAGNOSIS — E039 Hypothyroidism, unspecified: Secondary | ICD-10-CM | POA: Insufficient documentation

## 2011-08-14 DIAGNOSIS — E785 Hyperlipidemia, unspecified: Secondary | ICD-10-CM | POA: Insufficient documentation

## 2011-08-14 DIAGNOSIS — R0989 Other specified symptoms and signs involving the circulatory and respiratory systems: Secondary | ICD-10-CM | POA: Insufficient documentation

## 2011-08-14 DIAGNOSIS — F3289 Other specified depressive episodes: Secondary | ICD-10-CM | POA: Insufficient documentation

## 2011-08-14 DIAGNOSIS — J4489 Other specified chronic obstructive pulmonary disease: Secondary | ICD-10-CM | POA: Insufficient documentation

## 2011-08-14 DIAGNOSIS — J449 Chronic obstructive pulmonary disease, unspecified: Secondary | ICD-10-CM | POA: Insufficient documentation

## 2011-08-14 DIAGNOSIS — F329 Major depressive disorder, single episode, unspecified: Secondary | ICD-10-CM | POA: Insufficient documentation

## 2011-08-14 DIAGNOSIS — I10 Essential (primary) hypertension: Secondary | ICD-10-CM | POA: Insufficient documentation

## 2011-08-14 DIAGNOSIS — Z5189 Encounter for other specified aftercare: Secondary | ICD-10-CM | POA: Insufficient documentation

## 2011-08-14 NOTE — Progress Notes (Signed)
Pt attended pulmonary rehab maintenance program for first session today. Blood pressures were elevated through out class 168/88 - 180/78. 02 SAT 95-96% RA. Pt was SR with PACs noted on monitor. No c/o of CP or SOB, tolerated class well (walked track only). Will continue to monitor and support.   Rosalie Doctor

## 2011-08-16 ENCOUNTER — Encounter (HOSPITAL_COMMUNITY)
Admission: RE | Admit: 2011-08-16 | Discharge: 2011-08-16 | Payer: Self-pay | Source: Ambulatory Visit | Attending: Internal Medicine | Admitting: Internal Medicine

## 2011-08-16 NOTE — Progress Notes (Signed)
Mrs. Prowell returned today for exercise, check in BP still elevated. 201/95.  Checked with auto cuff and manual.  Manual also reading 200/ 98.  She was taken to our treatment room, allowed to rest and recheck done 15 min later.  170/84.  Readings were faxed to Dr. Annitta Jersey office. Discussed this with Ms. Laufer and also call placed to Dr. Annitta Jersey office regarding the fax.  We were informed he is on vacation this week and will return on Monday.  Patient is to continue checking her BPS at home and recording.  Her previous readings were also faxed to Dr. Sharyn Lull.  We will follow up on Monday afternoon and advise patient about return to rehab depending on advisement from MD.

## 2011-08-21 ENCOUNTER — Encounter (HOSPITAL_COMMUNITY): Payer: Self-pay

## 2011-08-23 ENCOUNTER — Encounter (HOSPITAL_COMMUNITY): Admission: RE | Admit: 2011-08-23 | Payer: Self-pay | Source: Ambulatory Visit

## 2011-08-28 ENCOUNTER — Encounter (HOSPITAL_COMMUNITY): Payer: Medicare Other

## 2011-08-28 DIAGNOSIS — J449 Chronic obstructive pulmonary disease, unspecified: Secondary | ICD-10-CM | POA: Insufficient documentation

## 2011-08-28 DIAGNOSIS — E785 Hyperlipidemia, unspecified: Secondary | ICD-10-CM | POA: Insufficient documentation

## 2011-08-28 DIAGNOSIS — F329 Major depressive disorder, single episode, unspecified: Secondary | ICD-10-CM | POA: Insufficient documentation

## 2011-08-28 DIAGNOSIS — F3289 Other specified depressive episodes: Secondary | ICD-10-CM | POA: Insufficient documentation

## 2011-08-28 DIAGNOSIS — I10 Essential (primary) hypertension: Secondary | ICD-10-CM | POA: Insufficient documentation

## 2011-08-28 DIAGNOSIS — Z5189 Encounter for other specified aftercare: Secondary | ICD-10-CM | POA: Insufficient documentation

## 2011-08-28 DIAGNOSIS — R0609 Other forms of dyspnea: Secondary | ICD-10-CM | POA: Insufficient documentation

## 2011-08-28 DIAGNOSIS — E039 Hypothyroidism, unspecified: Secondary | ICD-10-CM | POA: Insufficient documentation

## 2011-08-28 DIAGNOSIS — J4489 Other specified chronic obstructive pulmonary disease: Secondary | ICD-10-CM | POA: Insufficient documentation

## 2011-08-28 DIAGNOSIS — R0989 Other specified symptoms and signs involving the circulatory and respiratory systems: Secondary | ICD-10-CM | POA: Insufficient documentation

## 2011-08-30 ENCOUNTER — Encounter (HOSPITAL_COMMUNITY): Payer: Medicare Other | Attending: Internal Medicine

## 2011-08-30 ENCOUNTER — Encounter (HOSPITAL_COMMUNITY): Payer: Medicare Other

## 2011-09-01 ENCOUNTER — Emergency Department (HOSPITAL_COMMUNITY)
Admission: EM | Admit: 2011-09-01 | Discharge: 2011-09-02 | Disposition: A | Discharge status: Home / Self Care | Payer: Medicare Other | Attending: Emergency Medicine | Admitting: Emergency Medicine

## 2011-09-01 ENCOUNTER — Encounter (HOSPITAL_COMMUNITY): Payer: Self-pay | Admitting: Emergency Medicine

## 2011-09-01 DIAGNOSIS — R5383 Other fatigue: Secondary | ICD-10-CM | POA: Insufficient documentation

## 2011-09-01 DIAGNOSIS — Z79899 Other long term (current) drug therapy: Secondary | ICD-10-CM | POA: Insufficient documentation

## 2011-09-01 DIAGNOSIS — I6789 Other cerebrovascular disease: Secondary | ICD-10-CM | POA: Insufficient documentation

## 2011-09-01 DIAGNOSIS — I10 Essential (primary) hypertension: Secondary | ICD-10-CM | POA: Insufficient documentation

## 2011-09-01 DIAGNOSIS — R269 Unspecified abnormalities of gait and mobility: Secondary | ICD-10-CM | POA: Insufficient documentation

## 2011-09-01 DIAGNOSIS — R5381 Other malaise: Secondary | ICD-10-CM | POA: Insufficient documentation

## 2011-09-01 NOTE — ED Notes (Signed)
ZOX:[id number]<AV> Expected date:09/01/11<BR> Expected time:<BR> Means of arrival:Ambulance<BR> Comments:<BR> HOLD FOR EMS

## 2011-09-01 NOTE — ED Notes (Signed)
Pt c/o hypertension. Pt has hx htn and has been compliant with meds however she was unable to take her lasix dose today. Pt states her BP has "been fine" until tonight. Pt is asymptomatic. Denies headache, chest pain, SOB, dizziness, NVD. Pt states she "feels weird". A&O x4. Neuro status intact.

## 2011-09-01 NOTE — ED Notes (Signed)
Per EMS: Pt comes from home with c/o hypertension. Pt has hx of htn but has not been taking lasix for past couple days. Pt is asymptomatic but has concerns about BP increasing.

## 2011-09-02 ENCOUNTER — Emergency Department (HOSPITAL_COMMUNITY): Payer: Medicare Other

## 2011-09-02 LAB — CBC WITH DIFFERENTIAL/PLATELET
Basophils Absolute: 0.1 10*3/uL (ref 0.0–0.1)
Eosinophils Absolute: 0.1 10*3/uL (ref 0.0–0.7)
Eosinophils Relative: 1 % (ref 0–5)
Lymphocytes Relative: 18 % (ref 12–46)
Lymphs Abs: 1.8 10*3/uL (ref 0.7–4.0)
MCH: 33.3 pg (ref 26.0–34.0)
MCV: 100 fL (ref 78.0–100.0)
Neutrophils Relative %: 73 % (ref 43–77)
Platelets: 371 10*3/uL (ref 150–400)
RBC: 4.27 MIL/uL (ref 3.87–5.11)
RDW: 12.4 % (ref 11.5–15.5)
WBC: 10 10*3/uL (ref 4.0–10.5)

## 2011-09-02 LAB — URINALYSIS, ROUTINE W REFLEX MICROSCOPIC
Ketones, ur: NEGATIVE mg/dL
Leukocytes, UA: NEGATIVE
Nitrite: NEGATIVE
Protein, ur: NEGATIVE mg/dL

## 2011-09-02 LAB — BASIC METABOLIC PANEL
BUN: 15 mg/dL (ref 6–23)
Calcium: 9.7 mg/dL (ref 8.4–10.5)
Chloride: 97 mEq/L (ref 96–112)
Creatinine, Ser: 0.73 mg/dL (ref 0.50–1.10)
GFR calc Af Amer: >90 mL/min (ref >90)

## 2011-09-02 NOTE — ED Notes (Signed)
1st attempt to obtain labs pt in [place]

## 2011-09-02 NOTE — ED Provider Notes (Signed)
History     CSN: [id number]  Arrival date & time 09/01/11  2318   First MD Initiated Contact with Patient 09/02/11 0142      Chief Complaint  Patient presents with  . Hypertension    (Consider location/radiation/quality/duration/timing/severity/associated sxs/prior treatment) HPI Comments: 77 year old female with a history of hypertension, COPD, atrial fibrillation for which she is treated with Pradaxa. She states that prior to arrival she was feeling like she had difficulty getting off the couch, both of her legs were weak and she was unable to lift herself. This is very abnormal for her, she was ambulatory, driving and her normal baseline earlier in the day. She took her blood pressure which was severely elevated at 220 systolic, called the ambulance for transport. Currently the patient states that her symptoms have much improved, she denies any complaints of visual changes, and speech abnormalities, vertigo or dizziness or headache. She never had any numbness or weakness of her arms and has no focal weakness of her legs, this was of bilateral weakness as it was described. Her symptoms have improved spontaneously as has her blood pressure which is currently 148/85. She denies chest pain, shortness of breath, palpitations, rashes. She does have mild swelling in her legs but states she has not had Lasix for 2 days. She recently started amlodipine, she takes amiodarone as well.  Patient is a 77 y.o. female presenting with hypertension. The history is provided by the patient and a relative.  Hypertension    Past Medical History  Diagnosis Date  . Hypertension   . Allergy history unknown   . Sinus problem   . Arthritis   . IBS (irritable bowel syndrome)   . Fibromyalgia   . COPD, severe   . Depression   . Hyperlipidemia   . Osteoarthritis   . Fibromyalgia     Past Surgical History  Procedure Date  . Tubal ligation   . Appendectomy   . Tonsillectomy   . Hemorrhoid surgery   .  Cholecystectomy     Family History  Problem Relation Age of Onset  . Adopted: Yes    History  Substance Use Topics  . Smoking status: Former Smoker -- 0.5 packs/day    Quit date: 02/27/2004  . Smokeless tobacco: Not on file  . Alcohol Use: Not on file    OB History    Grav Para Term Preterm Abortions TAB SAB Ect Mult Living                  Review of Systems  All other systems reviewed and are negative.    Allergies  Codeine and Penicillins  Home Medications   Current Outpatient Rx  Name Route Sig Dispense Refill  . ACETAMINOPHEN 325 MG PO TABS  8 tablets per day     . ALBUTEROL SULFATE HFA 108 (90 BASE) MCG/ACT IN AERS Inhalation Inhale 2 puffs into the lungs every 4 (four) hours as needed.      . AMIODARONE HCL 200 MG PO TABS Oral Take 1 tablet by mouth daily.     [name] AMLODIPINE BESYLATE 5 MG PO TABS Oral Take 5 mg by mouth daily.    [name] ESTRADIOL 1 MG PO TABS Oral Take 1 mg by mouth daily.      [name] FLUTICASONE-SALMETEROL 250-50 MCG/DOSE IN AEPB Inhalation Inhale 1 puff into the lungs every 12 (twelve) hours.      . FUROSEMIDE 40 MG PO TABS Oral Take 40 mg by mouth. 1/2 tablet qod    .  KLOR-CON M20 20 MEQ PO TBCR Oral Take 2 tablets by mouth Daily.    [name] LORAZEPAM 0.5 MG PO TABS Oral Take 0.5 mg by mouth 2 (two) times daily as needed.      [name] LOSARTAN POTASSIUM 50 MG PO TABS Oral Take 1 tablet by mouth daily.    [name] MAGNESIUM OXIDE 400 MG PO TABS Oral Take 400 mg by mouth 2 (two) times daily.      [name] MEDROXYPROGESTERONE ACETATE 2.5 MG PO TABS Oral Take 2.5 mg by mouth daily.      [name] ONE-DAILY MULTI VITAMINS PO TABS Oral Take 1 tablet by mouth daily.      [name] PRADAXA 150 MG PO CAPS Oral Take 1 tablet by mouth Every 12 hours.    [name] SYNTHROID 150 MCG PO TABS Oral Take 1 tablet by mouth daily.    [name] PREGABALIN 50 MG PO CAPS Oral Take 50 mg by mouth daily as needed. For pain      BP 168/84  Pulse 69  Temp 98.5 F (36.9 C) (Oral)  Resp 17  Ht 5\' 3"  (1.6 m)  Wt 147 lb (66.679 kg)   BMI 26.04 kg/m2  SpO2 95%  Physical Exam  Nursing note and vitals reviewed. Constitutional: She appears well-developed and well-nourished. No distress.  HENT:  Head: Normocephalic and atraumatic.  Mouth/Throat: Oropharynx is clear and moist. No oropharyngeal exudate.  Eyes: Conjunctivae and EOM are normal. Pupils are equal, round, and reactive to light. Right eye exhibits no discharge. Left eye exhibits no discharge. No scleral icterus.  Neck: Normal range of motion. Neck supple. No JVD present. No thyromegaly present.  Cardiovascular: Normal rate, regular rhythm, normal heart sounds and intact distal pulses.  Exam reveals no gallop and no friction rub.   No murmur heard. Pulmonary/Chest: Effort normal and breath sounds normal. No respiratory distress. She has no wheezes. She has no rales.  Abdominal: Soft. Bowel sounds are normal. She exhibits no distension and no mass. There is no tenderness.  Musculoskeletal: Normal range of motion. She exhibits no edema and no tenderness.  Lymphadenopathy:    She has no cervical adenopathy.  Neurological: She is alert. Coordination normal.       Speech and strength are normal in all 4 extremities, there is no nystagmus, pupillary exam is normal, sensation normal in all 4 extremities, no limb ataxia, ambulation with the patient requires her to hold the hand which is abnormal for her. She feels a little bit off balance but has no definite ataxia and does not fall to one side or the other.  Skin: Skin is warm and dry. No rash noted. No erythema.  Psychiatric: She has a normal mood and affect. Her behavior is normal.    ED Course  Procedures (including critical care time)  Labs Reviewed  URINALYSIS, ROUTINE W REFLEX MICROSCOPIC - Abnormal; Notable for the following:    APPearance CLOUDY (*)     All other components within normal limits  BASIC METABOLIC PANEL - Abnormal; Notable for the following:    Glucose, Bld 117 (*)     GFR calc non Af Amer 82  (*)     All other components within normal limits  CBC WITH DIFFERENTIAL   Ct Head Wo Contrast  09/02/2011  *RADIOLOGY REPORT*  Clinical Data: Weakness; hypertension.  CT HEAD WITHOUT CONTRAST  Technique:  Contiguous axial images were obtained from the base of the skull through the vertex without contrast.  Comparison: None.  Findings: There  is no evidence of acute infarction, mass lesion, or intra- or extra-axial hemorrhage on CT.  Periventricular and subcortical white matter change likely reflects small vessel ischemic microangiopathy.  The posterior fossa, including the cerebellum, brainstem and fourth ventricle, is within normal limits.  The third and lateral ventricles, and basal ganglia are unremarkable in appearance.  The cerebral hemispheres demonstrate grossly normal gray-white differentiation.  No mass effect or midline shift is seen.  There is no evidence of fracture; visualized osseous structures are unremarkable in appearance.  The orbits are within normal limits. The paranasal sinuses and mastoid air cells are well-aerated.  No significant soft tissue abnormalities are seen.  IMPRESSION:  1.  No acute intracranial pathology seen on CT. 2.  Scattered small vessel ischemic microangiopathy.  Original Report Authenticated By: Tonia Ghent, M.D.     1. Hypertension   2. Fatigue       MDM  Vital signs improving, EKG shows normal sinus rhythm, no signs of atrial fibrillation, no signs of ischemia. Check labs including potassium as the patient is on Lasix, CT scan of the head, reevaluate.  ED ECG REPORT  I personally interpreted this EKG   Date: 09/02/2011   Rate: 60  Rhythm: normal sinus rhythm  QRS Axis: normal  Intervals: normal  ST/T Wave abnormalities: normal  Conduction Disutrbances:none  Narrative Interpretation:   Old EKG Reviewed: none available  Labs reveal normal electrolytes and renal function, normal blood counts and a urinalysis which shows no signs of infection. CT  scan of the head shows no signs of stroke, normal age-related changes. I have ambulated with the patient and she shows no signs of ataxia, she now states that she is back to baseline and feels like herself, on repeat neurologic exam has clear speech and normal limits and motor function and requests discharge. I feel that she is stable for discharge, I have reviewed with her the indications for return and she has expressed her understanding verbally  Vida Roller, MD 09/02/11 [phone]

## 2011-09-04 ENCOUNTER — Encounter (HOSPITAL_COMMUNITY): Payer: Medicare Other

## 2011-09-04 ENCOUNTER — Encounter (HOSPITAL_COMMUNITY): Admission: RE | Admit: 2011-09-04 | Payer: Medicare Other | Source: Ambulatory Visit

## 2011-09-04 NOTE — Progress Notes (Signed)
[name] has been absent due to hypertensive issues.  She is being followed by Dr. Sharyn Lull.  She has been contacted and request to be dropped from the program until her blood pressures are at acceptable range for exercise.   Cathie Olden RN

## 2011-09-06 ENCOUNTER — Encounter (HOSPITAL_COMMUNITY): Payer: Medicare Other

## 2011-09-11 ENCOUNTER — Encounter (HOSPITAL_COMMUNITY): Payer: Medicare Other

## 2011-09-13 ENCOUNTER — Encounter (HOSPITAL_COMMUNITY): Payer: Medicare Other

## 2011-09-18 ENCOUNTER — Encounter (HOSPITAL_COMMUNITY): Payer: Medicare Other

## 2011-09-20 ENCOUNTER — Encounter (HOSPITAL_COMMUNITY): Payer: Medicare Other

## 2011-09-25 ENCOUNTER — Encounter (HOSPITAL_COMMUNITY): Payer: Medicare Other

## 2011-09-27 ENCOUNTER — Encounter (HOSPITAL_COMMUNITY): Payer: Medicare Other

## 2011-10-02 ENCOUNTER — Encounter (HOSPITAL_COMMUNITY): Payer: Medicare Other

## 2011-10-04 ENCOUNTER — Encounter (HOSPITAL_COMMUNITY): Payer: Medicare Other

## 2011-10-09 ENCOUNTER — Encounter (HOSPITAL_COMMUNITY): Payer: Medicare Other

## 2011-10-11 ENCOUNTER — Encounter (HOSPITAL_COMMUNITY): Payer: Medicare Other

## 2011-10-16 ENCOUNTER — Encounter (HOSPITAL_COMMUNITY): Payer: Medicare Other

## 2011-10-18 ENCOUNTER — Encounter (HOSPITAL_COMMUNITY): Payer: Medicare Other

## 2011-10-23 ENCOUNTER — Encounter (HOSPITAL_COMMUNITY): Payer: Medicare Other

## 2011-10-25 ENCOUNTER — Encounter (HOSPITAL_COMMUNITY): Payer: Medicare Other

## 2011-10-30 ENCOUNTER — Encounter (HOSPITAL_COMMUNITY): Payer: Medicare Other

## 2011-11-01 ENCOUNTER — Encounter (HOSPITAL_COMMUNITY): Payer: Medicare Other

## 2011-11-02 ENCOUNTER — Other Ambulatory Visit: Payer: Self-pay | Admitting: Internal Medicine

## 2011-11-02 DIAGNOSIS — Z1231 Encounter for screening mammogram for malignant neoplasm of breast: Secondary | ICD-10-CM

## 2011-11-06 ENCOUNTER — Encounter (HOSPITAL_COMMUNITY): Payer: Medicare Other

## 2011-11-06 ENCOUNTER — Encounter: Payer: Self-pay | Admitting: Internal Medicine

## 2011-11-06 ENCOUNTER — Ambulatory Visit (INDEPENDENT_AMBULATORY_CARE_PROVIDER_SITE_OTHER): Payer: Medicare Other | Admitting: Internal Medicine

## 2011-11-06 ENCOUNTER — Ambulatory Visit (INDEPENDENT_AMBULATORY_CARE_PROVIDER_SITE_OTHER)
Admission: RE | Admit: 2011-11-06 | Discharge: 2011-11-06 | Disposition: A | Discharge status: Home / Self Care | Payer: Medicare Other | Source: Ambulatory Visit | Attending: Internal Medicine | Admitting: Internal Medicine

## 2011-11-06 VITALS — BP 152/72 | HR 63 | Ht 63.0 in | Wt 156.0 lb

## 2011-11-06 DIAGNOSIS — J449 Chronic obstructive pulmonary disease, unspecified: Secondary | ICD-10-CM

## 2011-11-06 NOTE — Progress Notes (Signed)
Patient ID: [name], female    DOB: Jul 24, 1934, 77 y.o.   MRN: [id number]  HPI 08/02/10- 77 yoF former smoker followed for dyspnea, COPD,  Complicated by hypothyroid, depression, HBP. Last here January 31, 2010- note reviewed. Here with son. Just out of hospital for hyponatremia and bronchitis. We discussed early return to her PCP Dr Ludwig Clarks. She was fine coming out of the hospital, while on prednisone. Son thinks being off prednisone is associated with her sense of fatigue.  Nebs helped in hospital- we compared her current meds.  Discussed her slow heart rate- she has appt in 2 days with Dr Sharyn Lull.  Denies dizzy, syncope.  11/07/10-  77 yoF former smoker followed for dyspnea, COPD,  Complicated by hypothyroid, depression, HBP, Hx PAfib. Stressed- daughter died in 10-07-2022- aged 63- bowel obstruction. Last visit here she was sent to hosp for 2 days w/ AFib.  Breathing usually ok in [facility], but easily dyspneic outdoors. Daily nap. Denies daily cough- scant clear. Denies chest pain, palpitation.  She quit smoking in 2006. Pulmonary rehab did help, but walking very little since.  Declines flu vax- will get from Dr Ludwig Clarks.  CXR-07/09/2010 hyperinflation but no active disease. Atherosclerosis noted in the aorta.  05/07/11- 77 yoF former smoker followed for dyspnea, COPD,  Complicated by hypothyroid, depression, HBP. Good winter with no significant respiratory infections. Denies routine cough or wheeze. She has done pulmonary rehabilitation in the past but is not regularly exercising now. She cleans her house one small project at a time and paces herself generally. We expressed concern about her blood pressure and she says it runs around 137 systolic at home. PFT-05/07/2011-severe obstructive airways disease with insignificant response to bronchodilator, air trapping. Diffusion is moderately reduced. FEV1 0.76/42%, FEV1/FVC 0.41.  11/06/11- 76 yoF former smoker followed for dyspnea, COPD,  Complicated by  hypothyroid, depression, HBP. SOB same,worse w/humid.no cough or wheeze ER visit July 6 for hypertension. Since then has felt more stable. Breathing has been controlled with no cough and she does better with Advair. Occasional minor hoarseness. She had tried returning to pulmonary rehabilitation but was stopped by her hypertension. History of atrial fibrillation on amiodarone. Gets flu shot at primary office COPD assessment test (CAT) score 9/40  Review of Systems-see HPI Constitutional:   No weight loss, night sweats,  Fevers, chills, fatigue, lassitude. HEENT:   No headaches,  Difficulty swallowing,  Tooth/dental problems,  Sore throat,                No sneezing, itching, ear ache, nasal congestion, post nasal drip,  CV:  No chest pain,  Orthopnea, PND, swelling in lower extremities, anasarca, dizziness, palpitations GI  No heartburn, indigestion, abdominal pain, nausea, vomiting,  Resp: As per HPI.  No excess mucus, no productive cough,  No non-productive cough,  No coughing up of blood.  No change in color of mucus.  No wheezing.    Skin: no rash or lesions. GU: no dysuria,  MS:  No joint pain or swelling.  Psych:  No change in mood or affect. No depression or anxiety.  No memory loss.    Objective:   Physical Exam General- Alert, Oriented, Affect-appropriate, Distress- none acute Skin- rash-none, lesions- none, excoriation- none Lymphadenopathy- none Head- atraumatic            Eyes- Gross vision intact, PERRLA, conjunctivae clear secretions            Ears- Hearing, canals  Nose- Clear, Septal dev, mucus, polyps, erosion, perforation             Throat- Mallampati II , mucosa clear , drainage- none, tonsils- atrophic Neck- flexible , trachea midline, no stridor , thyroid nl, carotid no bruit Chest - symmetrical excursion , unlabored           Heart/CV- RRR , no murmur , no gallop  , no rub, nl s1 s2                           - JVD- none , edema- none, stasis changes-  none, varices- none           Lung- +very distant, unlabored , wheeze- none, cough- none , dullness-none, rub- none           Chest wall-  Abd- Br/ Gen/ Rectal- Not done, not indicated Extrem- cyanosis- none, clubbing, none, atrophy- none, strength- nl Neuro- grossly intact to observation

## 2011-11-06 NOTE — Patient Instructions (Addendum)
Order CXR- dx COPD  Sample Advair 250 1 puff and rinse mouth, twice daily  Sample Tudorza  1 puff, twice daily  Please call as needed

## 2011-11-07 ENCOUNTER — Ambulatory Visit: Payer: Medicare Other | Admitting: Internal Medicine

## 2011-11-07 NOTE — Progress Notes (Signed)
Quick Note:  Pt aware of results. No further questions. ______ 

## 2011-11-07 NOTE — Progress Notes (Signed)
Quick Note:  LMTCB ______ 

## 2011-11-08 ENCOUNTER — Encounter (HOSPITAL_COMMUNITY): Payer: Medicare Other

## 2011-11-13 ENCOUNTER — Encounter (HOSPITAL_COMMUNITY): Payer: Medicare Other

## 2011-11-14 NOTE — Assessment & Plan Note (Signed)
Good symptom control. We discussed potential interaction of dyspnea symptoms with cardiac disease/A. fib and related meds Plan-chest x-ray sample trial Advair 250

## 2011-11-15 ENCOUNTER — Encounter (HOSPITAL_COMMUNITY): Payer: Medicare Other

## 2011-11-20 ENCOUNTER — Encounter (HOSPITAL_COMMUNITY)
Admission: RE | Admit: 2011-11-20 | Discharge: 2011-11-20 | Disposition: A | Discharge status: Home / Self Care | Payer: Medicare Other | Source: Ambulatory Visit | Attending: Internal Medicine | Admitting: Internal Medicine

## 2011-11-20 ENCOUNTER — Encounter (HOSPITAL_COMMUNITY): Payer: Medicare Other

## 2011-11-20 DIAGNOSIS — E039 Hypothyroidism, unspecified: Secondary | ICD-10-CM | POA: Insufficient documentation

## 2011-11-20 DIAGNOSIS — I10 Essential (primary) hypertension: Secondary | ICD-10-CM | POA: Insufficient documentation

## 2011-11-20 DIAGNOSIS — E785 Hyperlipidemia, unspecified: Secondary | ICD-10-CM | POA: Insufficient documentation

## 2011-11-20 DIAGNOSIS — J4489 Other specified chronic obstructive pulmonary disease: Secondary | ICD-10-CM | POA: Insufficient documentation

## 2011-11-20 DIAGNOSIS — F3289 Other specified depressive episodes: Secondary | ICD-10-CM | POA: Insufficient documentation

## 2011-11-20 DIAGNOSIS — Z5189 Encounter for other specified aftercare: Secondary | ICD-10-CM | POA: Insufficient documentation

## 2011-11-20 DIAGNOSIS — J449 Chronic obstructive pulmonary disease, unspecified: Secondary | ICD-10-CM | POA: Insufficient documentation

## 2011-11-20 DIAGNOSIS — R0989 Other specified symptoms and signs involving the circulatory and respiratory systems: Secondary | ICD-10-CM | POA: Insufficient documentation

## 2011-11-20 DIAGNOSIS — R0609 Other forms of dyspnea: Secondary | ICD-10-CM | POA: Insufficient documentation

## 2011-11-20 DIAGNOSIS — F329 Major depressive disorder, single episode, unspecified: Secondary | ICD-10-CM | POA: Insufficient documentation

## 2011-11-22 ENCOUNTER — Encounter (HOSPITAL_COMMUNITY): Payer: Medicare Other

## 2011-11-27 ENCOUNTER — Encounter (HOSPITAL_COMMUNITY): Payer: Medicare Other

## 2011-11-28 ENCOUNTER — Other Ambulatory Visit: Payer: Self-pay | Admitting: Dermatology

## 2011-11-29 ENCOUNTER — Encounter (HOSPITAL_COMMUNITY): Payer: Medicare Other

## 2011-12-04 ENCOUNTER — Encounter (HOSPITAL_COMMUNITY): Payer: Medicare Other

## 2011-12-11 ENCOUNTER — Ambulatory Visit
Admission: RE | Admit: 2011-12-11 | Discharge: 2011-12-11 | Disposition: A | Discharge status: Home / Self Care | Payer: Medicare Other | Source: Ambulatory Visit | Attending: Internal Medicine | Admitting: Internal Medicine

## 2011-12-11 DIAGNOSIS — Z1231 Encounter for screening mammogram for malignant neoplasm of breast: Secondary | ICD-10-CM

## 2012-01-31 ENCOUNTER — Telehealth: Payer: Self-pay | Admitting: Internal Medicine

## 2012-01-31 NOTE — Telephone Encounter (Signed)
Per CY-okay to sample Dulera 100/5 #1 2 puffs and rinse BID instead of Advair and Tudorza #1 1 puff twice daily. Pt aware that meds at front for pick up.

## 2012-01-31 NOTE — Telephone Encounter (Signed)
Pt is in the donut hole and she has to pay full price for these meds.  Pt is requesting samples of the advair and tudorza.  CY please advise. Thanks    Last ov--10/2011 Next ov--10/2012  Allergies  Allergen Reactions  . Codeine   . Penicillins

## 2012-02-01 ENCOUNTER — Telehealth: Payer: Self-pay | Admitting: Internal Medicine

## 2012-02-01 NOTE — Telephone Encounter (Signed)
Was able to help pt w/out having to leave a message.  [name]

## 2012-03-19 ENCOUNTER — Telehealth: Payer: Self-pay | Admitting: Internal Medicine

## 2012-03-19 MED ORDER — TIOTROPIUM BROMIDE MONOHYDRATE 18 MCG IN CAPS: 18.0000 ug | ORAL_CAPSULE | Freq: Every day | RESPIRATORY_TRACT | Status: DC

## 2012-03-19 MED ORDER — MOMETASONE FURO-FORMOTEROL FUM 100-5 MCG/ACT IN AERO: 2.0000 | INHALATION_SPRAY | Freq: Two times a day (BID) | RESPIRATORY_TRACT | Status: DC

## 2012-03-19 NOTE — Telephone Encounter (Signed)
Pt last seen 9.10.13.  Called spoke with patient who is requesting samples of advair 250-26mcg and tudorza.    1- per 12.5.13 phone note: Reynaldo Minium, CMA 01/31/2012 5:05 PM Signed  Per CY-okay to sample Dulera 100/5 #1 2 puffs and rinse BID instead of Advair and Tudorza #1 1 puff twice daily. Pt aware that meds at front for pick up. Per that phone note, pt was in the donut hole. 2- Dr Maple Hudson, may pt have samples?  If so, please advise dulera vs advair. 3- Tudorza nor spiriva are currently on pt's med list.  She was given samples of turdoza at last ov. 4- pt requesting rx for tudorza be sent to [name].  Please advise if okay to add to med list to send rx.

## 2012-03-19 NOTE — Telephone Encounter (Signed)
Called spoke with patient, advised of CY's recs as stated below.  Pt okay with these recs and verbalized her understanding.  Med list updated.  Rx's sent to [facility].  Pt to come pick up samples at her convenience.  Samples documented per protocol.

## 2012-03-19 NOTE — Telephone Encounter (Signed)
Per CY-- Patient may have sample of Dulera 100/50 2 puffs and rinse BID as well as a sample of Spiriva 1 puff daily. Per CY patient will probably come out better with Spiriva #3 w 3 refills and Dulera #3 with 3 refills Rxs.

## 2012-03-25 ENCOUNTER — Emergency Department (HOSPITAL_COMMUNITY): Payer: Medicare Other

## 2012-03-25 ENCOUNTER — Other Ambulatory Visit: Payer: Self-pay

## 2012-03-25 ENCOUNTER — Encounter (HOSPITAL_COMMUNITY): Payer: Self-pay | Admitting: Cardiology

## 2012-03-25 ENCOUNTER — Inpatient Hospital Stay (HOSPITAL_COMMUNITY): Payer: Medicare Other

## 2012-03-25 ENCOUNTER — Inpatient Hospital Stay (HOSPITAL_COMMUNITY)
Admission: EM | Admit: 2012-03-25 | Discharge: 2012-03-27 | DRG: 065 | Disposition: A | Discharge status: Home / Self Care | Payer: Medicare Other | Attending: Family Medicine | Admitting: Family Medicine

## 2012-03-25 DIAGNOSIS — I10 Essential (primary) hypertension: Secondary | ICD-10-CM

## 2012-03-25 DIAGNOSIS — J4489 Other specified chronic obstructive pulmonary disease: Secondary | ICD-10-CM

## 2012-03-25 DIAGNOSIS — R209 Unspecified disturbances of skin sensation: Secondary | ICD-10-CM | POA: Diagnosis present

## 2012-03-25 DIAGNOSIS — I639 Cerebral infarction, unspecified: Secondary | ICD-10-CM

## 2012-03-25 DIAGNOSIS — R0602 Shortness of breath: Secondary | ICD-10-CM

## 2012-03-25 DIAGNOSIS — M129 Arthropathy, unspecified: Secondary | ICD-10-CM | POA: Diagnosis present

## 2012-03-25 DIAGNOSIS — F329 Major depressive disorder, single episode, unspecified: Secondary | ICD-10-CM | POA: Diagnosis present

## 2012-03-25 DIAGNOSIS — Z79899 Other long term (current) drug therapy: Secondary | ICD-10-CM

## 2012-03-25 DIAGNOSIS — IMO0001 Reserved for inherently not codable concepts without codable children: Secondary | ICD-10-CM | POA: Diagnosis present

## 2012-03-25 DIAGNOSIS — J449 Chronic obstructive pulmonary disease, unspecified: Secondary | ICD-10-CM | POA: Diagnosis present

## 2012-03-25 DIAGNOSIS — G8190 Hemiplegia, unspecified affecting unspecified side: Secondary | ICD-10-CM | POA: Diagnosis present

## 2012-03-25 DIAGNOSIS — I6350 Cerebral infarction due to unspecified occlusion or stenosis of unspecified cerebral artery: Secondary | ICD-10-CM

## 2012-03-25 DIAGNOSIS — K589 Irritable bowel syndrome without diarrhea: Secondary | ICD-10-CM | POA: Diagnosis present

## 2012-03-25 DIAGNOSIS — F3289 Other specified depressive episodes: Secondary | ICD-10-CM

## 2012-03-25 DIAGNOSIS — E785 Hyperlipidemia, unspecified: Secondary | ICD-10-CM

## 2012-03-25 DIAGNOSIS — E039 Hypothyroidism, unspecified: Secondary | ICD-10-CM

## 2012-03-25 DIAGNOSIS — I4891 Unspecified atrial fibrillation: Secondary | ICD-10-CM

## 2012-03-25 DIAGNOSIS — Z7901 Long term (current) use of anticoagulants: Secondary | ICD-10-CM

## 2012-03-25 DIAGNOSIS — R001 Bradycardia, unspecified: Secondary | ICD-10-CM

## 2012-03-25 DIAGNOSIS — M1990 Unspecified osteoarthritis, unspecified site: Secondary | ICD-10-CM

## 2012-03-25 HISTORY — DX: Unspecified atrial fibrillation: I48.91

## 2012-03-25 HISTORY — DX: Chronic obstructive pulmonary disease, unspecified: J44.9

## 2012-03-25 LAB — URINALYSIS, ROUTINE W REFLEX MICROSCOPIC
Hgb urine dipstick: NEGATIVE
Leukocytes, UA: NEGATIVE
Nitrite: NEGATIVE
Protein, ur: NEGATIVE mg/dL
Specific Gravity, Urine: 1.013 (ref 1.005–1.030)
Urobilinogen, UA: 0.2 mg/dL (ref 0.0–1.0)

## 2012-03-25 LAB — PROTIME-INR
INR: 2.04 — ABNORMAL HIGH (ref 0.00–1.49)
Prothrombin Time: 22.2 seconds — ABNORMAL HIGH (ref 11.6–15.2)

## 2012-03-25 LAB — CBC
HCT: 43 % (ref 36.0–46.0)
MCHC: 33 g/dL (ref 30.0–36.0)
MCV: 99.5 fL (ref 78.0–100.0)
Platelets: 353 10*3/uL (ref 150–400)
RDW: 12.9 % (ref 11.5–15.5)

## 2012-03-25 LAB — BASIC METABOLIC PANEL
CO2: 26 mEq/L (ref 19–32)
Calcium: 9.9 mg/dL (ref 8.4–10.5)
Chloride: 98 mEq/L (ref 96–112)
Glucose, Bld: 106 mg/dL — ABNORMAL HIGH (ref 70–99)
Sodium: 139 mEq/L (ref 135–145)

## 2012-03-25 LAB — POCT I-STAT TROPONIN I: Troponin i, poc: 0 ng/mL (ref 0.00–0.08)

## 2012-03-25 MED ORDER — AMLODIPINE BESYLATE 2.5 MG PO TABS
2.5000 mg | ORAL_TABLET | Freq: Every day | ORAL | Status: DC
  Administered 2012-03-26 – 2012-03-27 (×2): 2.5 mg via ORAL
  Filled 2012-03-25 (×2): qty 1

## 2012-03-25 MED ORDER — LOSARTAN POTASSIUM 50 MG PO TABS
100.0000 mg | ORAL_TABLET | Freq: Every day | ORAL | Status: DC
  Administered 2012-03-26 – 2012-03-27 (×2): 100 mg via ORAL
  Filled 2012-03-25 (×2): qty 2

## 2012-03-25 MED ORDER — SENNOSIDES-DOCUSATE SODIUM 8.6-50 MG PO TABS: 1.0000 | ORAL_TABLET | Freq: Every evening | ORAL | Status: DC | PRN

## 2012-03-25 MED ORDER — ONDANSETRON HCL 4 MG/2ML IJ SOLN: 4.0000 mg | Freq: Four times a day (QID) | INTRAMUSCULAR | Status: DC | PRN

## 2012-03-25 MED ORDER — ALBUTEROL SULFATE HFA 108 (90 BASE) MCG/ACT IN AERS
2.0000 | INHALATION_SPRAY | RESPIRATORY_TRACT | Status: DC | PRN
  Filled 2012-03-25: qty 6.7

## 2012-03-25 MED ORDER — PREGABALIN 50 MG PO CAPS
50.0000 mg | ORAL_CAPSULE | Freq: Every day | ORAL | Status: DC | PRN
  Filled 2012-03-25: qty 1

## 2012-03-25 MED ORDER — ONE-DAILY MULTI VITAMINS PO TABS: 1.0000 | ORAL_TABLET | Freq: Every day | ORAL | Status: DC

## 2012-03-25 MED ORDER — LORAZEPAM 1 MG PO TABS
1.0000 mg | ORAL_TABLET | Freq: Once | ORAL | Status: AC
  Administered 2012-03-25: 1 mg via ORAL
  Filled 2012-03-25: qty 1

## 2012-03-25 MED ORDER — SODIUM CHLORIDE 0.9 % IV BOLUS (SEPSIS)
300.0000 mL | Freq: Once | INTRAVENOUS | Status: AC
  Administered 2012-03-25: 300 mL via INTRAVENOUS

## 2012-03-25 MED ORDER — DABIGATRAN ETEXILATE MESYLATE 150 MG PO CAPS
150.0000 mg | ORAL_CAPSULE | Freq: Two times a day (BID) | ORAL | Status: DC
  Administered 2012-03-25 – 2012-03-26 (×2): 150 mg via ORAL
  Filled 2012-03-25 (×3): qty 1

## 2012-03-25 MED ORDER — ACETAMINOPHEN 325 MG PO TABS
650.0000 mg | ORAL_TABLET | ORAL | Status: DC | PRN
  Administered 2012-03-25 – 2012-03-27 (×4): 650 mg via ORAL
  Filled 2012-03-25 (×4): qty 2

## 2012-03-25 MED ORDER — AMIODARONE HCL 200 MG PO TABS
200.0000 mg | ORAL_TABLET | Freq: Every day | ORAL | Status: DC
  Administered 2012-03-26 – 2012-03-27 (×2): 200 mg via ORAL
  Filled 2012-03-25 (×2): qty 1

## 2012-03-25 MED ORDER — MAGNESIUM OXIDE 400 MG PO TABS
400.0000 mg | ORAL_TABLET | Freq: Two times a day (BID) | ORAL | Status: DC
  Administered 2012-03-25 – 2012-03-27 (×4): 400 mg via ORAL
  Filled 2012-03-25 (×7): qty 1

## 2012-03-25 MED ORDER — ADULT MULTIVITAMIN W/MINERALS CH
1.0000 | ORAL_TABLET | Freq: Every day | ORAL | Status: DC
  Administered 2012-03-26 – 2012-03-27 (×2): 1 via ORAL
  Filled 2012-03-25 (×2): qty 1

## 2012-03-25 MED ORDER — LORAZEPAM 0.5 MG PO TABS
0.5000 mg | ORAL_TABLET | Freq: Two times a day (BID) | ORAL | Status: DC | PRN
  Administered 2012-03-26: 0.5 mg via ORAL
  Filled 2012-03-25: qty 1

## 2012-03-25 MED ORDER — MOMETASONE FURO-FORMOTEROL FUM 100-5 MCG/ACT IN AERO
2.0000 | INHALATION_SPRAY | Freq: Two times a day (BID) | RESPIRATORY_TRACT | Status: DC
  Administered 2012-03-26 – 2012-03-27 (×2): 2 via RESPIRATORY_TRACT
  Filled 2012-03-25: qty 8.8

## 2012-03-25 MED ORDER — SODIUM CHLORIDE 0.9 % IV SOLN
INTRAVENOUS | Status: AC
  Administered 2012-03-25 – 2012-03-26 (×3): via INTRAVENOUS

## 2012-03-25 MED ORDER — FUROSEMIDE 40 MG PO TABS
40.0000 mg | ORAL_TABLET | Freq: Every day | ORAL | Status: DC
  Administered 2012-03-26 – 2012-03-27 (×2): 40 mg via ORAL
  Filled 2012-03-25 (×2): qty 1

## 2012-03-25 MED ORDER — TIOTROPIUM BROMIDE MONOHYDRATE 18 MCG IN CAPS
18.0000 ug | ORAL_CAPSULE | Freq: Every day | RESPIRATORY_TRACT | Status: DC
  Administered 2012-03-27: 18 ug via RESPIRATORY_TRACT
  Filled 2012-03-25: qty 5

## 2012-03-25 MED ORDER — ACETAMINOPHEN 650 MG RE SUPP: 650.0000 mg | RECTAL | Status: DC | PRN

## 2012-03-25 MED ORDER — SPIRONOLACTONE 25 MG PO TABS
25.0000 mg | ORAL_TABLET | Freq: Every day | ORAL | Status: DC
  Administered 2012-03-26 – 2012-03-27 (×2): 25 mg via ORAL
  Filled 2012-03-25 (×2): qty 1

## 2012-03-25 MED ORDER — LEVOTHYROXINE SODIUM 150 MCG PO TABS
150.0000 ug | ORAL_TABLET | Freq: Every day | ORAL | Status: DC
  Administered 2012-03-26 – 2012-03-27 (×2): 150 ug via ORAL
  Filled 2012-03-25 (×3): qty 1

## 2012-03-25 NOTE — H&P (Addendum)
Patient's PCP: Ralene Ok, MD Patient's Cardiologist: Dr. Sharyn Lull  Chief Complaint: Left lower extremity weakness and numbness  History of Present Illness: [name] is a 78 y.o. Caucasian female history of hypertension, depression, hyperlipidemia, fibromyalgia, COPD, A. fib on chronic anticoagulation, and irritable bowel syndrome who presents with the above complaints.  Patient reports that she woke up at 4 o'clock this morning and noted that her left leg was numb and weak.  She eventually made her way to the bathroom using a cane.  She called her primary care physician's office at 830 a.m. who advised her to go to the ER but patient wanted to be seen by her primary care physician as a result was made an appointment today.  After evaluation her primary care physician for outpatient likely had a stroke and had her brought to the emergency department for further evaluation.  Patient reports that she missed taking her doses of Pradaxa as she ran out of those medications and resumed the medication on 03/16/2012 after getting few samples through Dr. Annitta Jersey office.  She finally had her medication come through the mail.  She has not had any recent fevers, chills, nausea, vomiting, chest pain, abdominal pain, diarrhea, headaches or vision changes.  She does report having chronic shortness of breath at baseline but attributes that to her COPD. MRI of her brain shows small acute nonhemorrhagic infarct as a result the hospitalist service was asked to admit the patient for further care and management since patient was at a therapeutic for TPA.  Review of Systems: All systems reviewed with the patient and positive as per history of present illness, otherwise all other systems are negative.  Past Medical History  Diagnosis Date  . Hypertension   . Allergy history unknown   . Sinus problem   . Arthritis   . IBS (irritable bowel syndrome)   . Fibromyalgia   . COPD, severe   . Depression   .  Hyperlipidemia   . Osteoarthritis   . Fibromyalgia   . COPD (chronic obstructive pulmonary disease)   . A-fib    Past Surgical History  Procedure Date  . Tubal ligation   . Appendectomy   . Tonsillectomy   . Hemorrhoid surgery   . Cholecystectomy    Family History  Problem Relation Age of Onset  . Adopted: Yes  Family history: Patient reports that her blood related uncles had heart problems  History   Social History  . Marital Status: Divorced    Spouse Name: N/A    Number of Children: 3  . Years of Education: N/A   Occupational History  . retired    Social History Main Topics  . Smoking status: Former Smoker -- 0.5 packs/day    Quit date: 02/27/2004  . Smokeless tobacco: Not on file  . Alcohol Use: No  . Drug Use: Not on file  . Sexually Active: Not on file   Other Topics Concern  . Not on file   Social History Narrative  . No narrative on file   Allergies: Codeine and Penicillins  Home Meds: Prior to Admission medications   Medication Sig Start Date End Date Taking? Authorizing Provider  acetaminophen (TYLENOL) 325 MG tablet Take 650 mg by mouth every 6 (six) hours as needed. For pain/fever   Yes Historical Provider, MD  albuterol (PROAIR HFA) 108 (90 BASE) MCG/ACT inhaler Inhale 2 puffs into the lungs every 4 (four) hours as needed. For shortness of breath   Yes Historical Provider, MD  amiodarone (PACERONE) 200 MG tablet Take 1 tablet by mouth daily.  07/18/10  Yes Historical Provider, MD  amLODipine (NORVASC) 2.5 MG tablet Take 2.5 mg by mouth daily.   Yes Historical Provider, MD  furosemide (LASIX) 40 MG tablet Take 40 mg by mouth daily.  04/10/11  Yes Historical Provider, MD  LORazepam (ATIVAN) 0.5 MG tablet Take 0.5 mg by mouth 2 (two) times daily as needed.     Yes Historical Provider, MD  losartan (COZAAR) 100 MG tablet Take 100 mg by mouth daily.   Yes Historical Provider, MD  magnesium oxide (MAG-OX) 400 MG tablet Take 400 mg by mouth 2 (two) times  daily.     Yes Historical Provider, MD  mometasone-formoterol (DULERA) 100-5 MCG/ACT AERO Inhale 2 puffs into the lungs 2 (two) times daily. 03/19/12  Yes Waymon Budge, MD  Multiple Vitamin (MULTIVITAMIN) tablet Take 1 tablet by mouth daily.     Yes Historical Provider, MD  PRADAXA 150 MG CAPS Take 1 tablet by mouth Every 12 hours. 07/18/10  Yes Historical Provider, MD  pregabalin (LYRICA) 50 MG capsule Take 50 mg by mouth daily as needed. For pain   Yes Historical Provider, MD  spironolactone (ALDACTONE) 25 MG tablet Take 25 mg by mouth daily.   Yes Historical Provider, MD  SYNTHROID 150 MCG tablet Take 1 tablet by mouth daily. 11/02/10  Yes Historical Provider, MD  tiotropium (SPIRIVA) 18 MCG inhalation capsule Place 18 mcg into inhaler and inhale daily. 03/19/12  Yes Waymon Budge, MD    Physical Exam: Blood pressure 131/39, pulse 65, temperature 98.2 F (36.8 C), temperature source Oral, resp. rate 29, SpO2 95.00%. General: Awake, Oriented x3, No acute distress. HEENT: EOMI, Moist mucous membranes Neck: Supple CV: S1 and S2 Lungs: Clear to ascultation bilaterally Abdomen: Soft, Nontender, Nondistended, +bowel sounds. Ext: Good pulses. Trace edema. No clubbing or cyanosis noted. Neuro: Cranial Nerves II-XII grossly intact. Has 5/5 motor strength in right upper and lower extremities.  5/5 motor strength in left upper extremity.  4/5 motor strength in left lower extremity.  Lab results:  Basename 03/25/12 1128  NA 139  K 3.8  CL 98  CO2 26  GLUCOSE 106*  BUN 22  CREATININE 0.86  CALCIUM 9.9  MG --  PHOS --   No results found for this basename: AST:2,ALT:2,ALKPHOS:2,BILITOT:2,PROT:2,ALBUMIN:2 in the last 72 hours No results found for this basename: LIPASE:2,AMYLASE:2 in the last 72 hours  Basename 03/25/12 1128  WBC 10.7*  NEUTROABS --  HGB 14.2  HCT 43.0  MCV 99.5  PLT 353   No results found for this basename: CKTOTAL:3,CKMB:3,CKMBINDEX:3,TROPONINI:3 in the last 72  hours No components found with this basename: POCBNP:3 No results found for this basename: DDIMER in the last 72 hours No results found for this basename: HGBA1C:2 in the last 72 hours No results found for this basename: CHOL:2,HDL:2,LDLCALC:2,TRIG:2,CHOLHDL:2,LDLDIRECT:2 in the last 72 hours No results found for this basename: TSH,T4TOTAL,FREET3,T3FREE,THYROIDAB in the last 72 hours No results found for this basename: VITAMINB12:2,FOLATE:2,FERRITIN:2,TIBC:2,IRON:2,RETICCTPCT:2 in the last 72 hours Imaging results:  Ct Head Wo Contrast  03/25/2012  *RADIOLOGY REPORT*  Clinical Data: Weakness in the left lower extremity since early this morning. Hyperlipidemia, hypertension, and COPD.  CT HEAD WITHOUT CONTRAST  Technique:  Contiguous axial images were obtained from the base of the skull through the vertex without contrast.  Comparison: 09/02/2011.  Findings: There is no evidence for acute infarction, intracranial hemorrhage, mass lesion, hydrocephalus, or extra-axial fluid.  Mild atrophy  is present.  There is mild to moderate chronic microvascular ischemic change.  The calvarium is intact.  Carotid atherosclerosis is noted in the siphon regions.  There is no sinus or mastoid disease.  Compared with priors, similar appearance is noted.  IMPRESSION: Mild atrophy and mild to moderate chronic microvascular ischemic change.  No visible acute stroke.   Original Report Authenticated By: Davonna Belling, M.D.    Mr Angiogram Head Wo Contrast  03/25/2012  *RADIOLOGY REPORT*  Clinical Data:  Awoke with left leg numbness and tingling.  High blood pressure and hyperlipidemia.  MRI BRAIN WITHOUT CONTRAST MRA HEAD WITHOUT CONTRAST  Technique: Multiplanar, multiecho pulse sequences of the brain and surrounding structures were obtained according to standard protocol without intravenous contrast.  Angiographic images of the head were obtained using MRA technique without contrast.  Comparison: 03/25/2012 head CT.  No  comparison MR.  MRI HEAD  Findings:  Small acute non hemorrhagic infarct posterior right corona radiata extending into the posterior aspect of the posterior limb of the right internal capsule.  No intracranial hemorrhage.  Mild to moderate small vessel disease type changes.  Global atrophy without hydrocephalus.  No intracranial mass lesion detected on this unenhanced exam.  Minimal anterior slip of C2 and C3 with mild cervical spondylotic changes.  Cervical medullary junction, pituitary region, pineal region and orbital structures unremarkable.  IMPRESSION: Small acute non hemorrhagic infarct posterior right corona radiata extending into the posterior aspect of the posterior limb of the right internal capsule.  Please see above.  MRA HEAD  Findings: Anterior circulation without medium or large size vessel significant stenosis or occlusion.  Prominent infundibulum posterior communicating artery region noted bilaterally without discrete aneurysm.  Middle cerebral artery branch vessel irregularity bilaterally slightly greater on the left.  Codominant vertebral arteries.  No significant stenosis of the distal vertebral arteries or basilar artery.  Irregularity and narrowing of the PICA greater on the left.  Nonvisualization AICAs.  Mild irregularity superior cerebellar artery bilaterally.  Moderate  narrowing proximal left posterior cerebral artery with marked narrowing proximal to the left posterior cerebral artery bifurcation and poor delineation distal branches.  Moderate to marked narrowing right posterior cerebral artery just proximal to the bifurcation.  Mild irregularity right posterior cerebral artery branch vessels.  IMPRESSION: Intracranial atherosclerotic type changes as detailed above.  Results called to Johnnette Gourd resident in the emergency room 03/25/2012 5:04 p.m.   Original Report Authenticated By: Lacy Duverney, M.D.    Mr Brain Wo Contrast  03/25/2012  *RADIOLOGY REPORT*  Clinical Data:  Awoke with  left leg numbness and tingling.  High blood pressure and hyperlipidemia.  MRI BRAIN WITHOUT CONTRAST MRA HEAD WITHOUT CONTRAST  Technique: Multiplanar, multiecho pulse sequences of the brain and surrounding structures were obtained according to standard protocol without intravenous contrast.  Angiographic images of the head were obtained using MRA technique without contrast.  Comparison: 03/25/2012 head CT.  No comparison MR.  MRI HEAD  Findings:  Small acute non hemorrhagic infarct posterior right corona radiata extending into the posterior aspect of the posterior limb of the right internal capsule.  No intracranial hemorrhage.  Mild to moderate small vessel disease type changes.  Global atrophy without hydrocephalus.  No intracranial mass lesion detected on this unenhanced exam.  Minimal anterior slip of C2 and C3 with mild cervical spondylotic changes.  Cervical medullary junction, pituitary region, pineal region and orbital structures unremarkable.  IMPRESSION: Small acute non hemorrhagic infarct posterior right corona radiata extending into the posterior  aspect of the posterior limb of the right internal capsule.  Please see above.  MRA HEAD  Findings: Anterior circulation without medium or large size vessel significant stenosis or occlusion.  Prominent infundibulum posterior communicating artery region noted bilaterally without discrete aneurysm.  Middle cerebral artery branch vessel irregularity bilaterally slightly greater on the left.  Codominant vertebral arteries.  No significant stenosis of the distal vertebral arteries or basilar artery.  Irregularity and narrowing of the PICA greater on the left.  Nonvisualization AICAs.  Mild irregularity superior cerebellar artery bilaterally.  Moderate  narrowing proximal left posterior cerebral artery with marked narrowing proximal to the left posterior cerebral artery bifurcation and poor delineation distal branches.  Moderate to marked narrowing right posterior  cerebral artery just proximal to the bifurcation.  Mild irregularity right posterior cerebral artery branch vessels.  IMPRESSION: Intracranial atherosclerotic type changes as detailed above.  Results called to Johnnette Gourd resident in the emergency room 03/25/2012 5:04 p.m.   Original Report Authenticated By: Lacy Duverney, M.D.    Other results: EKG: Sinus with PACs with heart rate of 69.  Assessment & Plan by Problem: Small acute non-hemorrhagic infarct in the posterior right corona radiata extending into the posterior aspect of posterior limb of the right internal capsule Uncertain if missing a few days of Pradaxa contributed to her stroke. Has had MRI/MRA with results as indicated above.  Will get 2-D echocardiogram, and carotid Dopplers.  Allow for permissive hypertension.  PT/OT/speech therapy consultation ordered.  Check hemoglobin A1c and fasting lipid panel in the morning.  Appreciate neurology input.  Continue Pradaxa 150 mg BID.  Gently hydrate the patient on IV fluids.  Patient passed swallowing evaluation in the ER and was started on a diet.  Hypertension Hold home antihypertensive medications today and resume home antihypertensive medications tomorrow to allow for permissive hypertension.  If patient normotensive in the morning, may consider holding/decreasing dose of home antihypertensive medications.  Atrial fibrillation Currently in sinus rhythm.  Rate controlled.  Continue anticoagulation on Pradaxa.  Hyperlipidemia Currently not on any medications check lipid panel in the morning.  COPD Continue inhalers.  Stable.  Depression/fibromyalgia/anxiety Stable.  Continue home medications.  Hypothyroidism Continue Synthroid.  Prophylaxis Pradaxa.  CODE STATUS Full code.  Patient indicated that she does not wish to be on life support Dame-term.  Disposition Admit the patient to telemetry as inpatient.  Time spent on admission, talking to the patient, and coordinating care  was: 60 mins.  Keziyah Kneale A, MD 03/25/2012, 6:16 PM

## 2012-03-25 NOTE — Consult Note (Signed)
Reason for Consult: stroke Referring Physician: Devoria Albe  CC: Left leg weakness  History is obtained from:patient  HPI: [name] is a 78 y.o. female who awoke this morning with the left side completely numb and fairly weak. Since that time, she has improved considerably, however continues to have some left leg weakness. She has not been sick, but does have some chronic breathing problems from COPD. No vision changes.   She has a history of atrial fibrillation and is on pradaxa, but missed a few doses about 10 days ago, but has been taking it since then.   LKW: last night tpa given: no, out of window  ROS: A 14 point ROS was performed and is negative except as noted in the HPI.  Past Medical History  Diagnosis Date  . Hypertension   . Allergy history unknown   . Sinus problem   . Arthritis   . IBS (irritable bowel syndrome)   . Fibromyalgia   . COPD, severe   . Depression   . Hyperlipidemia   . Osteoarthritis   . Fibromyalgia   . COPD (chronic obstructive pulmonary disease)     Family History: adopted  Social History: Tob: denies  Exam: Current vital signs: BP 131/39  Pulse 65  Temp 98.2 F (36.8 C) (Oral)  Resp 29  SpO2 95% Vital signs in last 24 hours: Temp:  [98 F (36.7 C)-98.2 F (36.8 C)] 98.2 F (36.8 C) (01/28 1333) Pulse Rate:  [62-87] 65  (01/28 1709) Resp:  [15-29] 29  (01/28 1709) BP: (114-166)/(39-69) 131/39 mmHg (01/28 1707) SpO2:  [95 %-98 %] 95 % (01/28 1709)  General: in bed, NAD CV: Irregular Mental Status: Patient is awake, alert, oriented to person, place, month, year, and situation. Immediate and remote memory are intact. Patient is able to give a clear and coherent history. Able to spell world backwards without difficulty Ablet o give # of quarters in $2.75 Cranial Nerves: II: Visual Fields are full. Pupils are equal, round, and reactive to light.  Discs are difficult to visualize. III,IV, VI: EOMI without ptosis or  diploplia.  V: Facial sensation is symmetric to temperature VII: Facial movement is symmetric.  VIII: hearing is intact to voice X: Uvula elevates symmetrically XI: Shoulder shrug is symmetric. XII: tongue is midline without atrophy or fasciculations.  Motor: Tone is normal. Bulk is normal. 5/5 strength was present in right side, left arm 5/5 with exception of elbow extension 4+/5, left leg 4/5 hip flexion and 4/5 ankle dorsiflexion.  Sensory: Sensation is symmetric to light touch and pin in the arms and legs. Deep Tendon Reflexes: 2+ and symmetric in the biceps and patellae.  Cerebellar: FNF with intention tremor bilaterally  Gait: Not tested due to weakness.   I have reviewed labs in epic and the results pertinent to this consultation are: BMP. CBC unremarkable.   I have reviewed the images obtained: MR brain - small subcortical infarct.   Impression: 78 yo F with small subcortical infarct. Given she did miss a few doses of Pradaxa recently, it is possible this is due to her afib and does not nessesarily represents a true failure. Also possible would be small vessel disease.   Recommendations: 1. HgbA1c, fasting lipid panel 2. MRI, MRA  of the brain without contrast 3. PT consult, OT consult, Speech consult 4. Echocardiogram 5. Carotid dopplers 6. Prophylactic therapy-pradaxa 150mg  BID.  7. Risk factor modification 8. Telemetry monitoring 9. Frequent neuro checks 10. Permissive htn.  Roland Rack, MD [facility] [phone]  If 7pm- 7am, please page neurology on call at [phone].

## 2012-03-25 NOTE — ED Notes (Addendum)
Pt reports that at 0400 this morning she woke up noticed weakness in the left leg and stated that she had to hold onto things to make it to the bathroom. States she went to her PCP office and was sent here for evaluation. States she is still having weakness in that left leg. Pt with no facial droop, arm drift. Grip strengths equal.

## 2012-03-25 NOTE — ED Provider Notes (Signed)
History     CSN: [id number]  Arrival date & time 03/25/12  1133   First MD Initiated Contact with Patient 03/25/12 1219      Chief Complaint  Patient presents with  . Cerebrovascular Accident    (Consider location/radiation/quality/duration/timing/severity/associated sxs/prior treatment) HPI  Patient states about 4 AM this morning she woke up and tried to go to the bathroom however she had difficulty getting up off of the bed because her left leg felt like it was asleep and when she stood up it would give out on her. She states she had to walk to the bathroom holding onto furniture and the wall and got a cane to use. She relates about 5 AM she chewed a baby aspirin and took her regular morning medications. She states her left arm feels tired but is not numb. She denies having any difficulty speaking. She denies any headache or blurred vision. She denies any chest pain or shortness of breath. She states her neck feels a little bit stiff. She states she's never had anything like this before.  Patient is on Pradaxa for atrial fibrillation  PCP Dr Ludwig Clarks Cardiologist Dr Sharyn Lull  Past Medical History  Diagnosis Date  . Hypertension   . Allergy history unknown   . Sinus problem   . Arthritis   . IBS (irritable bowel syndrome)   . Fibromyalgia   . COPD, severe   . Depression   . Hyperlipidemia   . Osteoarthritis   . Fibromyalgia   . COPD (chronic obstructive pulmonary disease)     Past Surgical History  Procedure Date  . Tubal ligation   . Appendectomy   . Tonsillectomy   . Hemorrhoid surgery   . Cholecystectomy     Family History  Problem Relation Age of Onset  . Adopted: Yes    History  Substance Use Topics  . Smoking status: Former Smoker -- 0.5 packs/day    Quit date: 02/27/2004  . Smokeless tobacco: Not on file  . Alcohol Use: No  Lives at home Lives alone  OB History    Grav Para Term Preterm Abortions TAB SAB Ect Mult Living                   Review of Systems  All other systems reviewed and are negative.    Allergies  Codeine and Penicillins  Home Medications   Current Outpatient Rx  Name  Route  Sig  Dispense  Refill  . ACETAMINOPHEN 325 MG PO TABS   Oral   Take 650 mg by mouth every 6 (six) hours as needed. For pain/fever         . ALBUTEROL SULFATE HFA 108 (90 BASE) MCG/ACT IN AERS   Inhalation   Inhale 2 puffs into the lungs every 4 (four) hours as needed. For shortness of breath         . AMIODARONE HCL 200 MG PO TABS   Oral   Take 1 tablet by mouth daily.          [name] AMLODIPINE BESYLATE 2.5 MG PO TABS   Oral   Take 2.5 mg by mouth daily.         . FUROSEMIDE 40 MG PO TABS   Oral   Take 40 mg by mouth daily.          [name] LORAZEPAM 0.5 MG PO TABS   Oral   Take 0.5 mg by mouth 2 (two) times daily as needed.           [name]  LOSARTAN POTASSIUM 100 MG PO TABS   Oral   Take 100 mg by mouth daily.         [name] MAGNESIUM OXIDE 400 MG PO TABS   Oral   Take 400 mg by mouth 2 (two) times daily.           . MOMETASONE FURO-FORMOTEROL FUM 100-5 MCG/ACT IN AERO   Inhalation   Inhale 2 puffs into the lungs 2 (two) times daily.         [name] ONE-DAILY MULTI VITAMINS PO TABS   Oral   Take 1 tablet by mouth daily.           [name] PRADAXA 150 MG PO CAPS   Oral   Take 1 tablet by mouth Every 12 hours.         [name] PREGABALIN 50 MG PO CAPS   Oral   Take 50 mg by mouth daily as needed. For pain         . SPIRONOLACTONE 25 MG PO TABS   Oral   Take 25 mg by mouth daily.         [name] SYNTHROID 150 MCG PO TABS   Oral   Take 1 tablet by mouth daily.         [name] TIOTROPIUM BROMIDE MONOHYDRATE 18 MCG IN CAPS   Inhalation   Place 18 mcg into inhaler and inhale daily.           BP 166.69  Pulse 62  Temp 98.2 F (36.8 C) (Oral)  Resp 24  SpO2 95%  Vital signs normal except for hypertension in triage    BP at time of my exam was 140/50 without treatment   Physical Exam  Nursing note  and vitals reviewed. Constitutional: She is oriented to person, place, and time. She appears well-developed and well-nourished.  Non-toxic appearance. She does not appear ill. No distress.  HENT:  Head: Normocephalic and atraumatic.  Right Ear: External ear normal.  Left Ear: External ear normal.  Nose: Nose normal. No mucosal edema or rhinorrhea.  Mouth/Throat: Oropharynx is clear and moist and mucous membranes are normal. No dental abscesses or uvula swelling.  Eyes: Conjunctivae normal and EOM are normal. Pupils are equal, round, and reactive to light.  Neck: Normal range of motion and full passive range of motion without pain. Neck supple.  Cardiovascular: Normal rate, regular rhythm and normal heart sounds.  Exam reveals no gallop and no friction rub.   No murmur heard. Pulmonary/Chest: Effort normal and breath sounds normal. No respiratory distress. She has no wheezes. She has no rhonchi. She has no rales. She exhibits no tenderness and no crepitus.  Abdominal: Soft. Normal appearance and bowel sounds are normal. She exhibits no distension. There is no tenderness. There is no rebound and no guarding.  Musculoskeletal: Normal range of motion. She exhibits no edema and no tenderness.       Moves all extremities well.   Neurological: She is alert and oriented to person, place, and time. She has normal strength. No cranial nerve deficit.       Patient is able to lift her left lower leg against gravity but gets shaky and appears difficult for her, she is also able to flex her knee but has some difficulty maintaining her knee flexed. Otherwise her motor exam is normal  Skin: Skin is warm, dry and intact. No rash noted. No erythema. No pallor.  Psychiatric: She has a normal mood and affect. Her speech is  normal and behavior is normal. Her mood appears not anxious.    ED Course  Procedures (including critical care time)  Patient passed stroke swallow screen  17:00 Dr Amada Jupiter is seeing  patient in the ED  17:44 Dr Betti Cruz, hospitalist, admit to tele, team 4  Results for orders placed during the hospital encounter of 03/25/12  CBC      Component Value Range   WBC 10.7 (*) 4.0 - 10.5 K/uL   RBC 4.32  3.87 - 5.11 MIL/uL   Hemoglobin 14.2  12.0 - 15.0 g/dL   HCT 56.2  13.0 - 86.5 %   MCV 99.5  78.0 - 100.0 fL   MCH 32.9  26.0 - 34.0 pg   MCHC 33.0  30.0 - 36.0 g/dL   RDW 78.4  69.6 - 29.5 %   Platelets 353  150 - 400 K/uL  BASIC METABOLIC PANEL      Component Value Range   Sodium 139  135 - 145 mEq/L   Potassium 3.8  3.5 - 5.1 mEq/L   Chloride 98  96 - 112 mEq/L   CO2 26  19 - 32 mEq/L   Glucose, Bld 106 (*) 70 - 99 mg/dL   BUN 22  6 - 23 mg/dL   Creatinine, Ser 2.84  0.50 - 1.10 mg/dL   Calcium 9.9  8.4 - 13.2 mg/dL   GFR calc non Af Amer 64 (*) >90 mL/min   GFR calc Af Amer 75 (*) >90 mL/min  APTT      Component Value Range   aPTT 72 (*) 24 - 37 seconds  PROTIME-INR      Component Value Range   Prothrombin Time 22.2 (*) 11.6 - 15.2 seconds   INR 2.04 (*) 0.00 - 1.49  URINALYSIS, ROUTINE W REFLEX MICROSCOPIC      Component Value Range   Color, Urine YELLOW  YELLOW   APPearance CLEAR  CLEAR   Specific Gravity, Urine 1.013  1.005 - 1.030   pH 6.0  5.0 - 8.0   Glucose, UA NEGATIVE  NEGATIVE mg/dL   Hgb urine dipstick NEGATIVE  NEGATIVE   Bilirubin Urine NEGATIVE  NEGATIVE   Ketones, ur NEGATIVE  NEGATIVE mg/dL   Protein, ur NEGATIVE  NEGATIVE mg/dL   Urobilinogen, UA 0.2  0.0 - 1.0 mg/dL   Nitrite NEGATIVE  NEGATIVE   Leukocytes, UA NEGATIVE  NEGATIVE  POCT I-STAT TROPONIN I      Component Value Range   Troponin i, poc 0.00  0.00 - 0.08 ng/mL   Comment 3            Laboratory interpretation all normal with subtherapeutic INR  Ct Head Wo Contrast  03/25/2012  *RADIOLOGY REPORT*  Clinical Data: Weakness in the left lower extremity since early this morning. Hyperlipidemia, hypertension, and COPD.  CT HEAD WITHOUT CONTRAST  Technique:  Contiguous axial  images were obtained from the base of the skull through the vertex without contrast.  Comparison: 09/02/2011.  Findings: There is no evidence for acute infarction, intracranial hemorrhage, mass lesion, hydrocephalus, or extra-axial fluid.  Mild atrophy is present.  There is mild to moderate chronic microvascular ischemic change.  The calvarium is intact.  Carotid atherosclerosis is noted in the siphon regions.  There is no sinus or mastoid disease.  Compared with priors, similar appearance is noted.  IMPRESSION: Mild atrophy and mild to moderate chronic microvascular ischemic change.  No visible acute stroke.   Original Report Authenticated By: Davonna Belling, M.D.  Mr Angiogram Head Wo Contrast  Mr Brain Wo Contrast  03/25/2012  *RADIOLOGY REPORT*  Clinical Data:  Awoke with left leg numbness and tingling.  High blood pressure and hyperlipidemia.  MRI BRAIN WITHOUT CONTRAST MRA HEAD WITHOUT CONTRAST  Technique: Multiplanar, multiecho pulse sequences of the brain and surrounding structures were obtained according to standard protocol without intravenous contrast.  Angiographic images of the head were obtained using MRA technique without contrast.  Comparison: 03/25/2012 head CT.  No comparison MR.  MRI HEAD  Findings:  Small acute non hemorrhagic infarct posterior right corona radiata extending into the posterior aspect of the posterior limb of the right internal capsule.  No intracranial hemorrhage.  Mild to moderate small vessel disease type changes.  Global atrophy without hydrocephalus.  No intracranial mass lesion detected on this unenhanced exam.  Minimal anterior slip of C2 and C3 with mild cervical spondylotic changes.  Cervical medullary junction, pituitary region, pineal region and orbital structures unremarkable.  IMPRESSION: Small acute non hemorrhagic infarct posterior right corona radiata extending into the posterior aspect of the posterior limb of the right internal capsule.  Please see  above.  MRA HEAD  Findings: Anterior circulation without medium or large size vessel significant stenosis or occlusion.  Prominent infundibulum posterior communicating artery region noted bilaterally without discrete aneurysm.  Middle cerebral artery branch vessel irregularity bilaterally slightly greater on the left.  Codominant vertebral arteries.  No significant stenosis of the distal vertebral arteries or basilar artery.  Irregularity and narrowing of the PICA greater on the left.  Nonvisualization AICAs.  Mild irregularity superior cerebellar artery bilaterally.  Moderate  narrowing proximal left posterior cerebral artery with marked narrowing proximal to the left posterior cerebral artery bifurcation and poor delineation distal branches.  Moderate to marked narrowing right posterior cerebral artery just proximal to the bifurcation.  Mild irregularity right posterior cerebral artery branch vessels.  IMPRESSION: Intracranial atherosclerotic type changes as detailed above.  Results called to Johnnette Gourd resident in the emergency room 03/25/2012 5:04 p.m.   Original Report Authenticated By: Lacy Duverney, M.D.       Date: 03/25/2012  Rate: 69  Rhythm: normal sinus rhythm, sinus arrythmia  QRS Axis: normal  Intervals: normal  ST/T Wave abnormalities: nonspecific ST changes  Conduction Disutrbances:none  Narrative Interpretation:   Old EKG Reviewed: unchanged from 09/01/2011     1. Stroke     Plan admission   CRITICAL CARE Performed by: Devoria Albe L   Total critical care time: 31 min   Critical care time was exclusive of separately billable procedures and treating other patients.  Critical care was necessary to treat or prevent imminent or life-threatening deterioration.  Critical care was time spent personally by me on the following activities: development of treatment plan with patient and/or surrogate as well as nursing, discussions with consultants, evaluation of patient's response to  treatment, examination of patient, obtaining history from patient or surrogate, ordering and performing treatments and interventions, ordering and review of laboratory studies, ordering and review of radiographic studies, pulse oximetry and re-evaluation of patient's condition.   MDM           Ward Givens, MD 03/25/12 [phone]

## 2012-03-25 NOTE — ED Notes (Signed)
Pt aware of urine sample.  Pt had just gone and will try again soon

## 2012-03-25 NOTE — ED Notes (Signed)
Admitting MD at bedside.

## 2012-03-26 DIAGNOSIS — I498 Other specified cardiac arrhythmias: Secondary | ICD-10-CM

## 2012-03-26 DIAGNOSIS — I4891 Unspecified atrial fibrillation: Secondary | ICD-10-CM

## 2012-03-26 LAB — LIPID PANEL
HDL: 45 mg/dL (ref >39)
Total CHOL/HDL Ratio: 3.4 RATIO

## 2012-03-26 LAB — HEMOGLOBIN A1C: Mean Plasma Glucose: 117 mg/dL — ABNORMAL HIGH (ref <117)

## 2012-03-26 MED ORDER — RIVAROXABAN 20 MG PO TABS
20.0000 mg | ORAL_TABLET | Freq: Every day | ORAL | Status: DC
  Filled 2012-03-26: qty 1

## 2012-03-26 MED ORDER — RIVAROXABAN 20 MG PO TABS
20.0000 mg | ORAL_TABLET | Freq: Every day | ORAL | Status: DC
  Administered 2012-03-26 – 2012-03-27 (×2): 20 mg via ORAL
  Filled 2012-03-26 (×3): qty 1

## 2012-03-26 NOTE — Progress Notes (Signed)
Subjective: Patient seen , still has some weakness of left lower extremity.  Objective: Vital signs in last 24 hours: Temp:  [97.4 F (36.3 C)-98.2 F (36.8 C)] 97.6 F (36.4 C) (01/29 1048) Pulse Rate:  [57-69] 60  (01/29 1048) Resp:  [16-29] 18  (01/29 1048) BP: (114-155)/(39-79) 142/78 mmHg (01/29 1048) SpO2:  [92 %-100 %] 97 % (01/29 1048) Weight:  [72.439 kg (159 lb 11.2 oz)] 72.439 kg (159 lb 11.2 oz) (01/28 2015) Weight change:  Last BM Date: 03/26/12  Consults: Neuro Antibiotics  None Procedures: None  Intake/Output from previous day: 01/28 0701 - 01/29 0700 In: 240 [P.O.:240] Out: -  Total I/O In: 240 [P.O.:240] Out: 201 [Urine:200; Stool:1]   Physical Exam: Head: Normocephalic, atraumatic.  Eyes: No signs of jaundice, EOMI Nose: Mucous membranes dry.  Throat: Oropharynx nonerythematous, no exudate appreciated.  Neck: supple,No deformities, masses, or tenderness noted. Lungs: Normal respiratory effort. B/L Clear to auscultation, no crackles or wheezes.  Heart: Regular RR. S1 and S2 normal  Abdomen: BS normoactive. Soft, Nondistended, non-tender.  Extremities: No pretibial edema, no erythema Neuro: Left hemiparesis  Lab Results: Basic Metabolic Panel:  Basename 03/25/12 1128  NA 139  K 3.8  CL 98  CO2 26  GLUCOSE 106*  BUN 22  CREATININE 0.86  CALCIUM 9.9  MG --  PHOS --   Liver Function Tests: No results found for this basename: AST:2,ALT:2,ALKPHOS:2,BILITOT:2,PROT:2,ALBUMIN:2 in the last 72 hours No results found for this basename: LIPASE:2,AMYLASE:2 in the last 72 hours No results found for this basename: AMMONIA:2 in the last 72 hours CBC:  Basename 03/25/12 1128  WBC 10.7*  NEUTROABS --  HGB 14.2  HCT 43.0  MCV 99.5  PLT 353   Fasting Lipid Panel:  Basename 03/26/12 0550  CHOL 153  HDL 45  LDLCALC 83  TRIG 126  CHOLHDL 3.4  LDLDIRECT --   Thyroid Function Tests: No results found for this basename:  TSH,T4TOTAL,FREET4,T3FREE,THYROIDAB in the last 72 hours Anemia Panel: No results found for this basename: VITAMINB12,FOLATE,FERRITIN,TIBC,IRON,RETICCTPCT in the last 72 hours Coagulation:  Basename 03/25/12 1230  LABPROT 22.2*  INR 2.04*   Urine Drug Screen: Drugs of Abuse  No results found for this basename: labopia, cocainscrnur, labbenz, amphetmu, thcu, labbarb    Alcohol Level: No results found for this basename: ETH:2 in the last 72 hours Urinalysis:  Basename 03/25/12 1507  COLORURINE YELLOW  LABSPEC 1.013  PHURINE 6.0  GLUCOSEU NEGATIVE  HGBUR NEGATIVE  BILIRUBINUR NEGATIVE  KETONESUR NEGATIVE  PROTEINUR NEGATIVE  UROBILINOGEN 0.2  NITRITE NEGATIVE  LEUKOCYTESUR NEGATIVE    No results found for this or any previous visit (from the past 240 hour(s)).  Studies/Results: Dg Chest 2 View  03/25/2012  *RADIOLOGY REPORT*  Clinical Data: 78 year old female with CVA.  CHEST - 2 VIEW  Comparison: 11/06/2011 and prior chest radiographs  Findings: Mild cardiomegaly and COPD/emphysema again noted. There is no evidence of focal airspace disease, pulmonary edema, suspicious pulmonary nodule/mass, pleural effusion, or pneumothorax. No acute bony abnormalities are identified.  IMPRESSION: No evidence of acute cardiopulmonary disease.  Cardiomegaly and COPD/emphysema.   Original Report Authenticated By: Harmon Pier, M.D.    Ct Head Wo Contrast  03/25/2012  *RADIOLOGY REPORT*  Clinical Data: Weakness in the left lower extremity since early this morning. Hyperlipidemia, hypertension, and COPD.  CT HEAD WITHOUT CONTRAST  Technique:  Contiguous axial images were obtained from the base of the skull through the vertex without contrast.  Comparison: 09/02/2011.  Findings: There is  no evidence for acute infarction, intracranial hemorrhage, mass lesion, hydrocephalus, or extra-axial fluid.  Mild atrophy is present.  There is mild to moderate chronic microvascular ischemic change.  The calvarium is  intact.  Carotid atherosclerosis is noted in the siphon regions.  There is no sinus or mastoid disease.  Compared with priors, similar appearance is noted.  IMPRESSION: Mild atrophy and mild to moderate chronic microvascular ischemic change.  No visible acute stroke.   Original Report Authenticated By: Davonna Belling, M.D.    Mr Angiogram Head Wo Contrast  03/25/2012  *RADIOLOGY REPORT*  Clinical Data:  Awoke with left leg numbness and tingling.  High blood pressure and hyperlipidemia.  MRI BRAIN WITHOUT CONTRAST MRA HEAD WITHOUT CONTRAST  Technique: Multiplanar, multiecho pulse sequences of the brain and surrounding structures were obtained according to standard protocol without intravenous contrast.  Angiographic images of the head were obtained using MRA technique without contrast.  Comparison: 03/25/2012 head CT.  No comparison MR.  MRI HEAD  Findings:  Small acute non hemorrhagic infarct posterior right corona radiata extending into the posterior aspect of the posterior limb of the right internal capsule.  No intracranial hemorrhage.  Mild to moderate small vessel disease type changes.  Global atrophy without hydrocephalus.  No intracranial mass lesion detected on this unenhanced exam.  Minimal anterior slip of C2 and C3 with mild cervical spondylotic changes.  Cervical medullary junction, pituitary region, pineal region and orbital structures unremarkable.  IMPRESSION: Small acute non hemorrhagic infarct posterior right corona radiata extending into the posterior aspect of the posterior limb of the right internal capsule.  Please see above.  MRA HEAD  Findings: Anterior circulation without medium or large size vessel significant stenosis or occlusion.  Prominent infundibulum posterior communicating artery region noted bilaterally without discrete aneurysm.  Middle cerebral artery branch vessel irregularity bilaterally slightly greater on the left.  Codominant vertebral arteries.  No significant stenosis of the  distal vertebral arteries or basilar artery.  Irregularity and narrowing of the PICA greater on the left.  Nonvisualization AICAs.  Mild irregularity superior cerebellar artery bilaterally.  Moderate  narrowing proximal left posterior cerebral artery with marked narrowing proximal to the left posterior cerebral artery bifurcation and poor delineation distal branches.  Moderate to marked narrowing right posterior cerebral artery just proximal to the bifurcation.  Mild irregularity right posterior cerebral artery branch vessels.  IMPRESSION: Intracranial atherosclerotic type changes as detailed above.  Results called to Johnnette Gourd resident in the emergency room 03/25/2012 5:04 p.m.   Original Report Authenticated By: Lacy Duverney, M.D.    Mr Brain Wo Contrast  03/25/2012  *RADIOLOGY REPORT*  Clinical Data:  Awoke with left leg numbness and tingling.  High blood pressure and hyperlipidemia.  MRI BRAIN WITHOUT CONTRAST MRA HEAD WITHOUT CONTRAST  Technique: Multiplanar, multiecho pulse sequences of the brain and surrounding structures were obtained according to standard protocol without intravenous contrast.  Angiographic images of the head were obtained using MRA technique without contrast.  Comparison: 03/25/2012 head CT.  No comparison MR.  MRI HEAD  Findings:  Small acute non hemorrhagic infarct posterior right corona radiata extending into the posterior aspect of the posterior limb of the right internal capsule.  No intracranial hemorrhage.  Mild to moderate small vessel disease type changes.  Global atrophy without hydrocephalus.  No intracranial mass lesion detected on this unenhanced exam.  Minimal anterior slip of C2 and C3 with mild cervical spondylotic changes.  Cervical medullary junction, pituitary region, pineal region and orbital structures  unremarkable.  IMPRESSION: Small acute non hemorrhagic infarct posterior right corona radiata extending into the posterior aspect of the posterior limb of the right  internal capsule.  Please see above.  MRA HEAD  Findings: Anterior circulation without medium or large size vessel significant stenosis or occlusion.  Prominent infundibulum posterior communicating artery region noted bilaterally without discrete aneurysm.  Middle cerebral artery branch vessel irregularity bilaterally slightly greater on the left.  Codominant vertebral arteries.  No significant stenosis of the distal vertebral arteries or basilar artery.  Irregularity and narrowing of the PICA greater on the left.  Nonvisualization AICAs.  Mild irregularity superior cerebellar artery bilaterally.  Moderate  narrowing proximal left posterior cerebral artery with marked narrowing proximal to the left posterior cerebral artery bifurcation and poor delineation distal branches.  Moderate to marked narrowing right posterior cerebral artery just proximal to the bifurcation.  Mild irregularity right posterior cerebral artery branch vessels.  IMPRESSION: Intracranial atherosclerotic type changes as detailed above.  Results called to Johnnette Gourd resident in the emergency room 03/25/2012 5:04 p.m.   Original Report Authenticated By: Lacy Duverney, M.D.     Medications: Scheduled Meds:   . amiodarone  200 mg Oral Daily  . amLODipine  2.5 mg Oral Daily  . dabigatran  150 mg Oral Q12H  . furosemide  40 mg Oral Daily  . levothyroxine  150 mcg Oral QAC breakfast  . losartan  100 mg Oral Daily  . magnesium oxide  400 mg Oral BID  . mometasone-formoterol  2 puff Inhalation BID  . multivitamin with minerals  1 tablet Oral Daily  . spironolactone  25 mg Oral Daily  . tiotropium  18 mcg Inhalation Daily   Continuous Infusions:   . sodium chloride 75 mL/hr at 03/26/12 1047   PRN Meds:.acetaminophen, acetaminophen, albuterol, LORazepam, ondansetron (ZOFRAN) IV, pregabalin, senna-docusate  Assessment/Plan:      Principal Problem:  *CVA (cerebrovascular accident) Active Problems:  HYPOTHYROIDISM   HYPERLIPIDEMIA  DEPRESSION  HYPERTENSION  C O P D  A-fib  CVA: W/U for stroke Neuro following Called Dr Sharyn Lull and he is ok with changing Pradaxa to xarelto.  A fib Continue amiodarone,  Continue xarelto  COPD Continue spiriva, Dulera.  Hypothyroidism Continue synthroid   LOS: 1 day    Dispo: based on PT recommendation  [facility] Pager: [phone] 03/26/2012, 12:57 PM

## 2012-03-26 NOTE — Progress Notes (Signed)
Utilization review completed.  P.J. Riad Wagley,RN,BSN Case Manager [phone]  

## 2012-03-26 NOTE — Progress Notes (Signed)
ANTICOAGULATION CONSULT NOTE - Initial Consult  Pharmacy Consult for Xarelto Indication: atrial fibrillation  Allergies  Allergen Reactions  . Codeine Swelling  . Penicillins Other (See Comments)    Reaction unknown    Patient Measurements: Height: 5\' 2"  (157.5 cm) Weight: 159 lb 11.2 oz (72.439 kg) IBW/kg (Calculated) : 50.1  Heparin Dosing Weight:    Vital Signs: Temp: 97.6 F (36.4 C) (01/29 1048) Temp src: Oral (01/29 1048) BP: 142/78 mmHg (01/29 1048) Pulse Rate: 60  (01/29 1048)  Labs:  Basename 03/25/12 1230 03/25/12 1128  HGB -- 14.2  HCT -- 43.0  PLT -- 353  APTT 72* --  LABPROT 22.2* --  INR 2.04* --  HEPARINUNFRC -- --  CREATININE -- 0.86  CKTOTAL -- --  CKMB -- --  TROPONINI -- --    Estimated Creatinine Clearance: 52.6 ml/min (by C-G formula based on Cr of 0.86).   Medical History: Past Medical History  Diagnosis Date  . Hypertension   . Allergy history unknown   . Sinus problem   . Arthritis   . IBS (irritable bowel syndrome)   . Fibromyalgia   . COPD, severe   . Depression   . Hyperlipidemia   . Osteoarthritis   . Fibromyalgia   . COPD (chronic obstructive pulmonary disease)   . A-fib      Assessment: Admit Complaint: Left lower extremity weakness and numbness: new CVA  PMH: HTN, arthritis, IBS, fibromyalgia, COPD, OA, Afib  Anticoagulation: Hx Afib; CHADS2=2; Pradaxa 150 bid; CrCl ~ 52 (adjustment begins at <50). Recently missed few doses of Pradaxa d/t running out and delayed mail order supply.  Infectious Disease: WBC 10.7, afeb. No abx.  Cardiovascular: HTN,HLD, Afib, No ECHO report on file. BP 155/62, HR 64 Meds: Amiodarone, Norvasc 2.5,g po Lasix, Losartan, Magox, Spironolaction  Endocrinology: No TSH yet; last in 6/12 was elevated, continues on home Synthroid 150  Gastrointestinal / Nutrition: MV  Neurology: Depression, OA,fibromyalgia, MRI c/w small acute nonhemorrhagic infarct - no TPA d/t Pradaxa on  board.  Nephrology: Scr and lytes ok  Pulmonary: RA on Spiriva for COPD.  Hematology / Oncology: H/H and plts nml  PTA Medication Issues: reconciled  Best Practices: Pradaxa  Goal of Therapy:  Full dose anticoagulation for afib.  Plan:  D/c Pradaxa Start Xarelto 20mg  daily tonight.   Merilynn Finland, [employer] 03/26/2012,1:12 PM

## 2012-03-26 NOTE — Progress Notes (Signed)
Stroke Team Progress Note  HISTORY [name] is a 78 y.o. female who awoke this morning 03/25/2012 with the left side completely numb and fairly weak. Since that time, she has improved considerably, however continues to have some left leg weakness. She has not been sick, but does have some chronic breathing problems from COPD. No vision changes.  She has a history of atrial fibrillation and is on pradaxa, but missed a few doses about 10 days ago, but has been taking it since then. Patient was not a TPA candidate secondary to delay in arrival. She was admitted for further evaluation and treatment.  SUBJECTIVE No family is at the bedside.  Overall she feels her condition is gradually improving.   OBJECTIVE Most recent Vital Signs: Filed Vitals:   03/25/12 2356 03/26/12 0217 03/26/12 0339 03/26/12 0547  BP: 138/58 142/58 151/79 155/62  Pulse: 59 63 59 64  Temp: 97.6 F (36.4 C) 97.8 F (36.6 C) 97.5 F (36.4 C) 97.4 F (36.3 C)  TempSrc: Oral Oral Oral Oral  Resp: 17 17 17 18   Height:      Weight:      SpO2: 93% 96% 92% 96%   CBG (last 3)  No results found for this basename: GLUCAP:3 in the last 72 hours  IV Fluid Intake:     . sodium chloride 75 mL/hr at 03/25/12 2136   MEDICATIONS    . amiodarone  200 mg Oral Daily  . amLODipine  2.5 mg Oral Daily  . dabigatran  150 mg Oral Q12H  . furosemide  40 mg Oral Daily  . levothyroxine  150 mcg Oral QAC breakfast  . losartan  100 mg Oral Daily  . magnesium oxide  400 mg Oral BID  . mometasone-formoterol  2 puff Inhalation BID  . multivitamin with minerals  1 tablet Oral Daily  . spironolactone  25 mg Oral Daily  . tiotropium  18 mcg Inhalation Daily   PRN:  acetaminophen, acetaminophen, albuterol, LORazepam, ondansetron (ZOFRAN) IV, pregabalin, senna-docusate  Diet:  Cardiac thin liquids Activity:  OOB with assistance DVT Prophylaxis:  pradaxa  CLINICALLY SIGNIFICANT STUDIES Basic Metabolic Panel:  Lab 03/25/12 1610  NA  139  K 3.8  CL 98  CO2 26  GLUCOSE 106*  BUN 22  CREATININE 0.86  CALCIUM 9.9  MG --  PHOS --   Liver Function Tests: No results found for this basename: AST:2,ALT:2,ALKPHOS:2,BILITOT:2,PROT:2,ALBUMIN:2 in the last 168 hours CBC:  Lab 03/25/12 1128  WBC 10.7*  NEUTROABS --  HGB 14.2  HCT 43.0  MCV 99.5  PLT 353   Coagulation:  Lab 03/25/12 1230  LABPROT 22.2*  INR 2.04*   Cardiac Enzymes: No results found for this basename: CKTOTAL:3,CKMB:3,CKMBINDEX:3,TROPONINI:3 in the last 168 hours Urinalysis:  Lab 03/25/12 1507  COLORURINE YELLOW  LABSPEC 1.013  PHURINE 6.0  GLUCOSEU NEGATIVE  HGBUR NEGATIVE  BILIRUBINUR NEGATIVE  KETONESUR NEGATIVE  PROTEINUR NEGATIVE  UROBILINOGEN 0.2  NITRITE NEGATIVE  LEUKOCYTESUR NEGATIVE   Lipid Panel    Component Value Date/Time   CHOL 153 03/26/2012 0550   TRIG 126 03/26/2012 0550   HDL 45 03/26/2012 0550   CHOLHDL 3.4 03/26/2012 0550   VLDL 25 03/26/2012 0550   LDLCALC 83 03/26/2012 0550   HgbA1C  No results found for this basename: HGBA1C   Urine Drug Screen:   No results found for this basename: labopia, cocainscrnur, labbenz, amphetmu, thcu, labbarb    Alcohol Level: No results found for this basename: ETH:2 in  the last 168 hours  CT of the brain  03/25/2012  Mild atrophy and mild to moderate chronic microvascular ischemic change.  No visible acute stroke.   MRI of the brain  03/25/2012  Small acute non hemorrhagic infarct posterior right corona radiata extending into the posterior aspect of the posterior limb of the right internal capsule.    MRA of the brain  03/25/2012   Intracranial atherosclerotic type changes   2D Echocardiogram    Carotid Doppler    CXR  03/25/2012   No evidence of acute cardiopulmonary disease.  Cardiomegaly and COPD/emphysema.     EKG  normal sinus rhythm, nonspecific ST waves changes.   Therapy Recommendations   Physical Exam   Pleasant middle aged Caucasian lady not in distress.Awake alert.  Afebrile. Head is nontraumatic. Neck is supple without bruit. Hearing is normal. Cardiac exam no murmur or gallop. Lungs are clear to auscultation. Distal pulses are well felt.  Neurological exam ; Awake alert oriented x 3 normal speech and language.fundi were not visualized. Vision acuity and fields appear normal the Mild left lower face asymmetry. Tongue midline. No drift. Mild diminished fine finger movements on left. Orbits right over left upper extremity. Mild left grip weak.. Mild 4/5 left lower extremity proximal and ankle dorsiflexor weakness. Normal sensation . Normal coordination.  ASSESSMENT [name] is a 78 y.o. female presenting with  left side completely numb and weak. Imaging confirms a small posterior right corona radiata infarct extending into the posterior aspect of the posterior limb of the right internal capsule. Infarct felt to be embolic secondary to known atrial fibrillation.  On pradaxa prior to admission. Now on pradaxa for secondary stroke prevention. Patient with resultant left side hemisensory deficit and l mildeft hemiparesis. Work up underway.  Hypertension Hyperlipidemia, LDL 83, not on statin PTA, goal LDL < 100 Fibromyalgia COPD  Hospital day # 1  TREATMENT/PLAN  Consider changing pradaxa to Eliquis or Xarelto for secondary stroke prevention. Dr. Sharyn Lull is her cardiologist. Recommend discussing with him.  F/u HgbA1c  OOB, therapy evals  Annie Main, MSN, RN, ANVP-BC, ANP-BC, GNP-BC [facility] Pager: [phone] 03/26/2012 10:09 AM  I have personally obtained a history, examined the patient, evaluated imaging results, and formulated the assessment and plan of care. I agree with the above.   Delia Heady, MD Medical Director [facility] Pager: [phone] 03/26/2012 1:01 PM

## 2012-03-26 NOTE — Progress Notes (Signed)
Rehab Admissions Coordinator Note:  Patient was screened by Trish Mage for appropriateness for an Inpatient Acute Rehab Consult.  PT and OT recommending CIR.   At this time, we are recommending Inpatient Rehab consult.  Lelon Frohlich M 03/26/2012, 2:07 PM  I can be reached at [phone].

## 2012-03-26 NOTE — Evaluation (Signed)
Physical Therapy Evaluation Patient Details Name: [name] MRN: [id number] DOB: 1934/04/18 Today's Date: 03/26/2012 Time: 4098-1191 PT Time Calculation (min): 32 min  PT Assessment / Plan / Recommendation Clinical Impression  Pt s/p R CVA with decr mobility secondary to LLE weakness.  Will benefit from PT to address endurance, weakness  and balance issues.  Recommend Rehab c/s.      PT Assessment  Patient needs continued PT services    Follow Up Recommendations  CIR;Supervision - Intermittent    Does the patient have the potential to tolerate intense rehabilitation    yes  Barriers to Discharge Decreased caregiver support      Equipment Recommendations  Rolling walker with 5" wheels    Recommendations for Other Services Rehab consult   Frequency Min 4X/week    Precautions / Restrictions Precautions Precautions: Fall Restrictions Weight Bearing Restrictions: No   Pertinent Vitals/Pain VSS, Some pain      Mobility  Bed Mobility Bed Mobility: Not assessed Transfers Transfers: Sit to Stand;Stand to Sit Sit to Stand: 4: Min assist;With upper extremity assist;With armrests;From chair/3-in-1 Stand to Sit: 4: Min guard;With upper extremity assist;With armrests;To chair/3-in-1 Details for Transfer Assistance: Pt required cues to push up for the surface she was coming off of and to reach back.  Wanted to try to pull up on RW and needed cues for proper hand placement.   Ambulation/Gait Ambulation/Gait Assistance: 3: Mod assist Ambulation Distance (Feet): 140 Feet Assistive device: Rolling walker Ambulation/Gait Assistance Details: Pt initially needing cues for sequence of steps and RW (ie to move RW then left LE and step RLE up to LLE).  Pt able to get the sequence and do pretty well until she began to fatigue and then her LLE was dragging at times as well as L knee instability noted.  Pt with at least 3 LOB needing mod assist to recover.  Pt lacks awareness of when she is  beginning to fatigue and LLE is becoming weaker.   Needed cues that she needed to rest.  Pt ambulated into bathroom and needed mod assist to get off toilet.   Gait Pattern: Step-to pattern;Decreased step length - left;Decreased hip/knee flexion - left;Decreased dorsiflexion - left;Left foot flat;Narrow base of support Gait velocity: decreased Stairs: No Wheelchair Mobility Wheelchair Mobility: No Modified Rankin (Stroke Patients Only) Pre-Morbid Rankin Score: No symptoms Modified Rankin: Moderately severe disability              PT Diagnosis: Difficulty walking  PT Problem List: Decreased strength;Decreased activity tolerance;Decreased balance;Decreased mobility;Decreased coordination;Decreased safety awareness;Decreased knowledge of use of DME;Decreased knowledge of precautions PT Treatment Interventions: DME instruction;Gait training;Functional mobility training;Therapeutic activities;Therapeutic exercise;Balance training;Patient/family education;Stair training   PT Goals Acute Rehab PT Goals PT Goal Formulation: With patient Time For Goal Achievement: 04/09/12 Potential to Achieve Goals: Good Pt will go Sit to Supine/Side: Independently PT Goal: Sit to Supine/Side - Progress: Goal set today Pt will go Sit to Stand: Independently PT Goal: Sit to Stand - Progress: Goal set today Pt will Transfer Bed to Chair/Chair to Bed: Independently PT Transfer Goal: Bed to Chair/Chair to Bed - Progress: Goal set today Pt will Ambulate: 51 - 150 feet;with modified independence;with least restrictive assistive device PT Goal: Ambulate - Progress: Goal set today Pt will Go Up / Down Stairs: 3-5 stairs;with supervision;with least restrictive assistive device PT Goal: Up/Down Stairs - Progress: Goal set today  Visit Information  Last PT Received On: 03/26/12 Assistance Needed: +1    Subjective Data  Subjective: "I feel so unsteady." Patient Stated Goal: To go home   Prior Functioning  Home  Living Lives With: Alone Available Help at Discharge: Available PRN/intermittently Type of Home: House Home Access: Stairs to enter [employer] of Steps: 3 Entrance Stairs-Rails: Can reach both Home Layout: One level Bathroom Shower/Tub: Forensic scientist: Standard Bathroom Accessibility: No Home Adaptive Equipment: Straight cane;Walker - standard Prior Function Level of Independence: Independent Able to Take Stairs?: Yes Driving: Yes Vocation: Retired Musician: No difficulties Dominant Hand: Right    Cognition  Overall Cognitive Status: Appears within functional limits for tasks assessed/performed Arousal/Alertness: Awake/alert Orientation Level: Appears intact for tasks assessed Behavior During Session: [facility] for tasks performed Cognition - Other Comments: appears grossly intact.    Extremity/Trunk Assessment Right Upper Extremity Assessment RUE ROM/Strength/Tone: Within functional levels RUE Sensation: WFL - Light Touch RUE Coordination: WFL - gross/fine motor Left Upper Extremity Assessment LUE ROM/Strength/Tone: Deficits LUE ROM/Strength/Tone Deficits: Pt with shoulder strength 4-/5.  Otherwise 4+/5 LUE Sensation: Deficits LUE Sensation Deficits: Pt reports numbness in LUE.  She can feel to touch but still numb LUE Coordination: Deficits LUE Coordination Deficits: mild incoordination opening deodorant/toothpaste w LUE but is functional. Right Lower Extremity Assessment RLE ROM/Strength/Tone: [facility] for tasks assessed Left Lower Extremity Assessment LLE ROM/Strength/Tone: Deficits LLE ROM/Strength/Tone Deficits: grossly 3/5 LLE Sensation: Deficits LLE Sensation Deficits: decr to LT LLE Coordination: Deficits LLE Coordination Deficits: decr coordination Trunk Assessment Trunk Assessment: Normal   Balance Static Standing Balance Static Standing - Balance Support: Bilateral upper extremity supported;During functional  activity Static Standing - Level of Assistance: 4: Min assist Static Standing - Comment/# of Minutes: Min assist needed with RW statically.    End of Session PT - End of Session Equipment Utilized During Treatment: Gait belt Activity Tolerance: Patient limited by fatigue Patient left: in chair;with call bell/phone within reach Nurse Communication: Mobility status       INGOLD,Kaysey Berndt 03/26/2012, 1:53 PM  [facility] Rehabilitation [phone] (pager)

## 2012-03-26 NOTE — Progress Notes (Signed)
  Echocardiogram 2D Echocardiogram has been performed.  [name] 03/26/2012, 2:51 PM

## 2012-03-26 NOTE — Evaluation (Signed)
Occupational Therapy Evaluation Patient Details Name: [name] MRN: [id number] DOB: Jun 27, 1934 Today's Date: 03/26/2012 Time: 1914-7829 OT Time Calculation (min): 25 min  OT Assessment / Plan / Recommendation Clinical Impression  Pt is 78 yo female admitted with a R corona radiata/R internal capsule CVA resulting in deficits listed below.  Pt would benefit from cont OT to increase I and safety with adls so she can eventually return home alone    OT Assessment  Patient needs continued OT Services    Follow Up Recommendations  CIR    Barriers to Discharge Decreased caregiver support Pt does not have 24 hour S but may be able to get I enough through rehab to be mod I and return home with PRN assist.  Equipment Recommendations  3 in 1 bedside comode    Recommendations for Other Services Rehab consult  Frequency  Min 3X/week    Precautions / Restrictions Precautions Precautions: Fall Restrictions Weight Bearing Restrictions: No   Pertinent Vitals/Pain Pt with mild pain.    ADL  Eating/Feeding: Performed;Independent Where Assessed - Eating/Feeding: Chair Grooming: Performed;Wash/dry hands;Brushing hair;Minimal assistance Where Assessed - Grooming: Supported standing Upper Body Bathing: Simulated;Set up Where Assessed - Upper Body Bathing: Unsupported sitting Lower Body Bathing: Simulated;Moderate assistance Where Assessed - Lower Body Bathing: Supported sit to stand Upper Body Dressing: Simulated;Set up Where Assessed - Upper Body Dressing: Unsupported sitting Lower Body Dressing: Performed;Moderate assistance Where Assessed - Lower Body Dressing: Supported sit to stand Toilet Transfer: Performed;Minimal assistance Toilet Transfer Method: Stand pivot;Sit to Barista: Comfort height toilet;Grab bars Toileting - Architect and Hygiene: Min guard;Performed Where Assessed - Engineer, mining and Hygiene: Sit on 3-in-1 or  toilet Equipment Used: Gait belt Transfers/Ambulation Related to ADLs: Pt walked in room to bathroom with hand held assist.  Pt needed mod assist with significant weakness in LLE ADL Comments: Pt with more assist needed for LE adls secondary to difficulty keeping balance when standing.    OT Diagnosis: Generalized weakness;Paresis  OT Problem List: Decreased strength;Impaired balance (sitting and/or standing);Decreased coordination;Decreased knowledge of precautions;Impaired UE functional use OT Treatment Interventions:     OT Goals Acute Rehab OT Goals OT Goal Formulation: With patient Time For Goal Achievement: 04/09/12 Potential to Achieve Goals: Good ADL Goals Pt Will Perform Grooming: with supervision;Standing at sink ADL Goal: Grooming - Progress: Goal set today Pt Will Perform Lower Body Bathing: with supervision;Sit to stand from chair ADL Goal: Lower Body Bathing - Progress: Goal set today Pt Will Perform Lower Body Dressing: with supervision;Sit to stand from chair ADL Goal: Lower Body Dressing - Progress: Goal set today Additional ADL Goal #1: Pt will complete all aspects of toileting with S with 3:1 over commode. ADL Goal: Additional Goal #1 - Progress: Goal set today Arm Goals Additional Arm Goal #1: Pt will be I with HEP for LUE coordination. Arm Goal: Additional Goal #1 - Progress: Goal set today  Visit Information  Last OT Received On: 03/26/12 Assistance Needed: +1    Subjective Data  Subjective: "I wish Iwould have come in earlier." Patient Stated Goal: to be I again   Prior Functioning     Home Living Lives With: Alone Available Help at Discharge: Available PRN/intermittently Type of Home: House Home Access: Stairs to enter [employer] of Steps: 3 Entrance Stairs-Rails: Can reach both Home Layout: One level Bathroom Shower/Tub: Tub/shower unit;Curtain Bathroom Toilet: Standard Bathroom Accessibility: No Home Adaptive Equipment: Straight  cane;Walker - standard Prior Function Level  of Independence: Independent Able to Take Stairs?: Yes Driving: Yes Vocation: Retired Musician: No difficulties Dominant Hand: Right         Vision/Perception Vision - Assessment Eye Alignment: Within Functional Limits Vision Assessment: Vision tested Tracking/Visual Pursuits: Able to track stimulus in all quads without difficulty Visual Fields: No apparent deficits   Cognition  Overall Cognitive Status: Appears within functional limits for tasks assessed/performed Arousal/Alertness: Awake/alert Orientation Level: Oriented X4 / Intact Behavior During Session: [facility] for tasks performed Cognition - Other Comments: appears grossly intact.    Extremity/Trunk Assessment Right Upper Extremity Assessment RUE ROM/Strength/Tone: Within functional levels RUE Sensation: WFL - Light Touch RUE Coordination: WFL - gross/fine motor Left Upper Extremity Assessment LUE ROM/Strength/Tone: Deficits LUE ROM/Strength/Tone Deficits: Pt with shoulder strength 4-/5.  Otherwise 4+/5 LUE Sensation: Deficits LUE Sensation Deficits: Pt reports numbness in LUE.  She can feel to touch but still numb LUE Coordination: Deficits LUE Coordination Deficits: mild incoordination opening deodorant/toothpaste w LUE but is functional. Trunk Assessment Trunk Assessment: Normal     Mobility Transfers Transfers: Sit to Stand;Stand to Sit Sit to Stand: 4: Min assist;With upper extremity assist;From chair/3-in-1;With armrests Stand to Sit: 4: Min guard;With armrests;To chair/3-in-1 Details for Transfer Assistance: Pt required cues to push up from the surface she was leaving and reach back for chair w both hands when sitting.     Shoulder Instructions     Exercise     Balance     End of Session OT - End of Session Equipment Utilized During Treatment: Gait belt Activity Tolerance: Patient tolerated treatment well Patient left: in chair;with  call bell/phone within reach Nurse Communication: Mobility status  GO     Hope Budds 03/26/2012, 12:17 PM [phone]

## 2012-03-26 NOTE — Progress Notes (Signed)
VASCULAR LAB PRELIMINARY  PRELIMINARY  PRELIMINARY  PRELIMINARY  Carotid duplex  completed.    Preliminary report:  Bilateral:  No evidence of hemodynamically significant internal carotid artery stenosis.   Vertebral artery flow is antegrade.      Khristi Schiller, RVT 03/26/2012, 1:47 PM

## 2012-03-27 DIAGNOSIS — I6340 Cerebral infarction due to embolism of unspecified cerebral artery: Secondary | ICD-10-CM

## 2012-03-27 DIAGNOSIS — J449 Chronic obstructive pulmonary disease, unspecified: Secondary | ICD-10-CM

## 2012-03-27 MED ORDER — RIVAROXABAN 20 MG PO TABS: 20.0000 mg | ORAL_TABLET | Freq: Every day | ORAL | Status: DC

## 2012-03-27 NOTE — Progress Notes (Signed)
Stroke Team Progress Note  HISTORY [name] is a 78 y.o. female who awoke this morning 03/25/2012 with the left side completely numb and fairly weak. Since that time, she has improved considerably, however continues to have some left leg weakness. She has not been sick, but does have some chronic breathing problems from COPD. No vision changes.  She has a history of atrial fibrillation and is on pradaxa, but missed a few doses about 10 days ago, but has been taking it since then. Patient was not a TPA candidate secondary to delay in arrival. She was admitted for further evaluation and treatment.  SUBJECTIVE Husband at the bedside. Patient took first dose of xarelto last night.  OBJECTIVE Most recent Vital Signs: Filed Vitals:   03/27/12 0200 03/27/12 0600 03/27/12 0810 03/27/12 1025  BP: 150/50 150/53  151/61  Pulse: 66 65  66  Temp: 97.5 F (36.4 C) 97.6 F (36.4 C)  98.2 F (36.8 C)  TempSrc:    Oral  Resp: 18 18  16   Height:      Weight:      SpO2: 93% 92% 96% 94%   IV Fluid Intake:    MEDICATIONS    . amiodarone  200 mg Oral Daily  . amLODipine  2.5 mg Oral Daily  . furosemide  40 mg Oral Daily  . levothyroxine  150 mcg Oral QAC breakfast  . losartan  100 mg Oral Daily  . magnesium oxide  400 mg Oral BID  . mometasone-formoterol  2 puff Inhalation BID  . multivitamin with minerals  1 tablet Oral Daily  . rivaroxaban  20 mg Oral Q supper  . spironolactone  25 mg Oral Daily  . tiotropium  18 mcg Inhalation Daily   PRN:  acetaminophen, acetaminophen, albuterol, LORazepam, ondansetron (ZOFRAN) IV, pregabalin, senna-docusate  Diet:  Cardiac thin liquids Activity:  OOB with assistance DVT Prophylaxis:  pradaxa  CLINICALLY SIGNIFICANT STUDIES Basic Metabolic Panel:   Lab 03/25/12 1128  NA 139  K 3.8  CL 98  CO2 26  GLUCOSE 106*  BUN 22  CREATININE 0.86  CALCIUM 9.9  MG --  PHOS --   Liver Function Tests: No results found for this basename:  AST:2,ALT:2,ALKPHOS:2,BILITOT:2,PROT:2,ALBUMIN:2 in the last 168 hours CBC:   Lab 03/25/12 1128  WBC 10.7*  NEUTROABS --  HGB 14.2  HCT 43.0  MCV 99.5  PLT 353   Coagulation:   Lab 03/25/12 1230  LABPROT 22.2*  INR 2.04*   Cardiac Enzymes: No results found for this basename: CKTOTAL:3,CKMB:3,CKMBINDEX:3,TROPONINI:3 in the last 168 hours Urinalysis:   Lab 03/25/12 1507  COLORURINE YELLOW  LABSPEC 1.013  PHURINE 6.0  GLUCOSEU NEGATIVE  HGBUR NEGATIVE  BILIRUBINUR NEGATIVE  KETONESUR NEGATIVE  PROTEINUR NEGATIVE  UROBILINOGEN 0.2  NITRITE NEGATIVE  LEUKOCYTESUR NEGATIVE   Lipid Panel    Component Value Date/Time   CHOL 153 03/26/2012 0550   TRIG 126 03/26/2012 0550   HDL 45 03/26/2012 0550   CHOLHDL 3.4 03/26/2012 0550   VLDL 25 03/26/2012 0550   LDLCALC 83 03/26/2012 0550   HgbA1C  Lab Results  Component Value Date   HGBA1C 5.7* 03/26/2012   Urine Drug Screen:   No results found for this basename: labopia,  cocainscrnur,  labbenz,  amphetmu,  thcu,  labbarb    Alcohol Level: No results found for this basename: ETH:2 in the last 168 hours  CT of the brain  03/25/2012  Mild atrophy and mild to moderate chronic microvascular ischemic  change.  No visible acute stroke.   MRI of the brain  03/25/2012  Small acute non hemorrhagic infarct posterior right corona radiata extending into the posterior aspect of the posterior limb of the right internal capsule.    MRA of the brain  03/25/2012   Intracranial atherosclerotic type changes   2D Echocardiogram    Carotid Doppler  No evidence of hemodynamically significant internal carotid artery stenosis. Vertebral artery flow is antegrade.   CXR  03/25/2012   No evidence of acute cardiopulmonary disease.  Cardiomegaly and COPD/emphysema.     EKG  normal sinus rhythm, nonspecific ST waves changes.   Therapy Recommendations CIR  Physical Exam   Pleasant middle aged Caucasian lady not in distress.Awake alert. Afebrile. Head is  nontraumatic. Neck is supple without bruit. Hearing is normal. Cardiac exam no murmur or gallop. Lungs are clear to auscultation. Distal pulses are well felt.  Neurological exam ; Awake alert oriented x 3 normal speech and language.fundi were not visualized. Vision acuity and fields appear normal the Mild left lower face asymmetry. Tongue midline. No drift. Mild diminished fine finger movements on left. Orbits right over left upper extremity. Mild left grip weak.. Mild 4/5 left lower extremity proximal and ankle dorsiflexor weakness. Normal sensation. Normal coordination.   ASSESSMENT [name] is a 78 y.o. female presenting with  left side completely numb and weak. Imaging confirms a small posterior right corona radiata infarct extending into the posterior aspect of the posterior limb of the right internal capsule. Infarct felt to be embolic secondary to known atrial fibrillation.  On pradaxa prior to admission. Now on xarelto for secondary stroke prevention. Patient with resultant left side hemisensory deficit and l mildeft hemiparesis. Work up completed; awake 2D results.  Hypertension Hyperlipidemia, LDL 83, not on statin PTA, goal LDL < 100 Fibromyalgia COPD HgbA1c 5.7  Hospital day # 2  TREATMENT/PLAN  Continue xarelto for secondary stroke prevention. Dr. Sharyn Lull is her cardiologist. Recommend discussing with him.  F/u 2D results  Too high level for rehab, recommend OP follow up Stroke Service will sign off. Please call should any needs arise related to pending 2D results. Follow up with Dr. Pearlean Brownie, Stroke Clinic, in 2 months.  Annie Main, MSN, RN, ANVP-BC, ANP-BC, [facility] Pager: [phone] 03/27/2012 10:39 AM  I have personally obtained a history, examined the patient, evaluated imaging results, and formulated the assessment and plan of care. I agree with the above.  Delia Heady, MD Medical Director [facility] Pager:  [phone] 03/27/2012 10:39 AM

## 2012-03-27 NOTE — Care Management Note (Signed)
    Page 1 of 1   03/27/2012     1:26:36 PM   CARE MANAGEMENT NOTE 03/27/2012  Patient:  QUINTANA, CANELO   Account Number:  [id number]  Date Initiated:  03/27/2012  Documentation initiated by:  [facility]  Subjective/Objective Assessment:   admitted with CVA     Action/Plan:   PT/OT evals-recommended inpatient rehab  rehab consult-recommended outpatient PT/OT   Anticipated DC Date:  03/27/2012   Anticipated DC Plan:  HOME/SELF CARE      DC Planning Services  CM consult  OP Neuro Rehab      Choice offered to / List presented to:             Status of service:  Completed, signed off Medicare Important Message given?   (If response is "NO", the following Medicare IM given date fields will be blank) Date Medicare IM given:   Date Additional Medicare IM given:    Discharge Disposition:  HOME/SELF CARE  Per UR Regulation:  Reviewed for med. necessity/level of care/duration of stay  If discussed at [place] Length of Stay Meetings, dates discussed:    Comments:  03/27/12 Spoke with patient about outpatient PT/OT. She and her son are agreeable with outpatient PT/OT at the [facility]. Gave them map and phone # for [facility]. Referral, order, H and P and PT eval faxed to [phone]. Received fax confirmation. Patient does not have rolling walker. Contacted Darian at [facility] and requested RW be delivered to room prior to d/c. Jacquelynn Cree RN, BSN, CCM

## 2012-03-27 NOTE — Consult Note (Signed)
Physical Medicine and Rehabilitation Consult Reason for Consult: CVA Referring Physician: [facility]   HPI: [name] is a 78 y.o. right-handed female with history of fibromyalgia, atrial fibrillation maintained on Pradaxa. Admitted 03/25/2012 with left-sided weakness and numbness. MRI of the brain showed small acute nonhemorrhagic infarct posterior right corona radiata extending into the posterior aspect of the posterior limb of the right internal capsule. MRA of the head with atherosclerotic type changes. Carotid Dopplers with no ICA stenosis. Echocardiogram pending. Patient did not receive TPA. Neurology services consulted CVA felt to be embolic secondary to known atrial fibrillation and placed on xarelto in place of pradaxa. She is maintained on a regular consistency diet. Physical and occupational therapy evaluations completed an ongoing with recommendations of physical medicine rehabilitation consult to consider inpatient rehabilitation services   Review of Systems  Respiratory: Positive for cough and shortness of breath.   Gastrointestinal: Positive for diarrhea.  Musculoskeletal: Positive for myalgias and back pain.  Neurological: Positive for dizziness.  Psychiatric/Behavioral: Positive for depression.  All other systems reviewed and are negative.   Past Medical History  Diagnosis Date  . Hypertension   . Allergy history unknown   . Sinus problem   . Arthritis   . IBS (irritable bowel syndrome)   . Fibromyalgia   . COPD, severe   . Depression   . Hyperlipidemia   . Osteoarthritis   . Fibromyalgia   . COPD (chronic obstructive pulmonary disease)   . A-fib    Past Surgical History  Procedure Date  . Tubal ligation   . Appendectomy   . Tonsillectomy   . Hemorrhoid surgery   . Cholecystectomy    Family History  Problem Relation Age of Onset  . Adopted: Yes   Social History:  reports that she quit smoking about 8 years ago. She has never used smokeless tobacco. She  reports that she does not drink alcohol. Her drug history not on file. Allergies:  Allergies  Allergen Reactions  . Codeine Swelling  . Penicillins Other (See Comments)    Reaction unknown   Medications Prior to Admission  Medication Sig Dispense Refill  . acetaminophen (TYLENOL) 325 MG tablet Take 650 mg by mouth every 6 (six) hours as needed. For pain/fever      . albuterol (PROAIR HFA) 108 (90 BASE) MCG/ACT inhaler Inhale 2 puffs into the lungs every 4 (four) hours as needed. For shortness of breath      . amiodarone (PACERONE) 200 MG tablet Take 1 tablet by mouth daily.       [name] amLODipine (NORVASC) 2.5 MG tablet Take 2.5 mg by mouth daily.      . furosemide (LASIX) 40 MG tablet Take 40 mg by mouth daily.       [name] LORazepam (ATIVAN) 0.5 MG tablet Take 0.5 mg by mouth 2 (two) times daily as needed.        [name] losartan (COZAAR) 100 MG tablet Take 100 mg by mouth daily.      . magnesium oxide (MAG-OX) 400 MG tablet Take 400 mg by mouth 2 (two) times daily.        . mometasone-formoterol (DULERA) 100-5 MCG/ACT AERO Inhale 2 puffs into the lungs 2 (two) times daily.      . Multiple Vitamin (MULTIVITAMIN) tablet Take 1 tablet by mouth daily.        [name] PRADAXA 150 MG CAPS Take 1 tablet by mouth Every 12 hours.      . pregabalin (LYRICA) 50 MG capsule  Take 50 mg by mouth daily as needed. For pain      . spironolactone (ALDACTONE) 25 MG tablet Take 25 mg by mouth daily.      [name] SYNTHROID 150 MCG tablet Take 1 tablet by mouth daily.      [name] tiotropium (SPIRIVA) 18 MCG inhalation capsule Place 18 mcg into inhaler and inhale daily.        Home: Home Living Lives With: Alone Available Help at Discharge: Available PRN/intermittently Type of Home: House Home Access: Stairs to enter [employer] of Steps: 3 Entrance Stairs-Rails: Can reach both Home Layout: One level Bathroom Shower/Tub: Forensic scientist: Standard Bathroom Accessibility: No Home Adaptive  Equipment: Straight cane;Walker - standard  Functional History: Prior Function Able to Take Stairs?: Yes Driving: Yes Vocation: Retired Functional Status:  Mobility: Bed Mobility Bed Mobility: Not assessed Transfers Transfers: Sit to Stand;Stand to Sit Sit to Stand: 4: Min assist;With upper extremity assist;With armrests;From chair/3-in-1 Stand to Sit: 4: Min guard;With upper extremity assist;With armrests;To chair/3-in-1 Ambulation/Gait Ambulation/Gait Assistance: 3: Mod assist Ambulation Distance (Feet): 140 Feet Assistive device: Rolling walker Ambulation/Gait Assistance Details: Pt initially needing cues for sequence of steps and RW (ie to move RW then left LE and step RLE up to LLE).  Pt able to get the sequence and do pretty well until she began to fatigue and then her LLE was dragging at times as well as L knee instability noted.  Pt with at least 3 LOB needing mod assist to recover.  Pt lacks awareness of when she is beginning to fatigue and LLE is becoming weaker.   Needed cues that she needed to rest.  Pt ambulated into bathroom and needed mod assist to get off toilet.   Gait Pattern: Step-to pattern;Decreased step length - left;Decreased hip/knee flexion - left;Decreased dorsiflexion - left;Left foot flat;Narrow base of support Gait velocity: decreased Stairs: No Wheelchair Mobility Wheelchair Mobility: No  ADL: ADL Eating/Feeding: Performed;Independent Where Assessed - Eating/Feeding: Chair Grooming: Performed;Wash/dry hands;Brushing hair;Minimal assistance Where Assessed - Grooming: Supported standing Upper Body Bathing: Simulated;Set up Where Assessed - Upper Body Bathing: Unsupported sitting Lower Body Bathing: Simulated;Moderate assistance Where Assessed - Lower Body Bathing: Supported sit to stand Upper Body Dressing: Simulated;Set up Where Assessed - Upper Body Dressing: Unsupported sitting Lower Body Dressing: Performed;Moderate assistance Where Assessed -  Lower Body Dressing: Supported sit to stand Toilet Transfer: Performed;Minimal assistance Toilet Transfer Method: Stand pivot;Sit to Barista: Comfort height toilet;Grab bars Equipment Used: Gait belt Transfers/Ambulation Related to ADLs: Pt walked in room to bathroom with hand held assist.  Pt needed mod assist with significant weakness in LLE ADL Comments: Pt with more assist needed for LE adls secondary to difficulty keeping balance when standing.  Cognition: Cognition Arousal/Alertness: Awake/alert Orientation Level: Oriented X4 Cognition Overall Cognitive Status: Appears within functional limits for tasks assessed/performed Arousal/Alertness: Awake/alert Orientation Level: Appears intact for tasks assessed Behavior During Session: [facility] for tasks performed Cognition - Other Comments: appears grossly intact.  Blood pressure 150/50, pulse 66, temperature 97.5 F (36.4 C), temperature source Oral, resp. rate 18, height 5\' 2"  (1.575 m), weight 72.439 kg (159 lb 11.2 oz), SpO2 93.00%. Physical Exam  Vitals reviewed. Constitutional: She is oriented to person, place, and time. She appears well-developed and well-nourished.  HENT:  Head: Normocephalic and atraumatic.  Eyes: Conjunctivae normal and EOM are normal. Pupils are equal, round, and reactive to light.       Pupils round and reactive to light  Neck: Normal range of motion. Neck supple. No thyromegaly present.  Cardiovascular:       Cardiac rate controlled  Pulmonary/Chest: Effort normal and breath sounds normal. No respiratory distress.  Abdominal: Soft. Bowel sounds are normal. She exhibits no distension. There is no tenderness.  Neurological: She is alert and oriented to person, place, and time.       Follows full commands. Mild decreased fine motor skills on the left. LUE 4+. LLE 4/5. Stocking glove sensory loss in the feet. Cognitively intact. Kept eyes closed frequently. No focal CN abnormalities   Skin: Skin is warm and dry.  Psychiatric: She has a normal mood and affect. Her behavior is normal. Judgment and thought content normal.    No results found for this or any previous visit (from the past 24 hour(s)). Dg Chest 2 View  03/25/2012  *RADIOLOGY REPORT*  Clinical Data: 78 year old female with CVA.  CHEST - 2 VIEW  Comparison: 11/06/2011 and prior chest radiographs  Findings: Mild cardiomegaly and COPD/emphysema again noted. There is no evidence of focal airspace disease, pulmonary edema, suspicious pulmonary nodule/mass, pleural effusion, or pneumothorax. No acute bony abnormalities are identified.  IMPRESSION: No evidence of acute cardiopulmonary disease.  Cardiomegaly and COPD/emphysema.   Original Report Authenticated By: Harmon Pier, M.D.    Ct Head Wo Contrast  03/25/2012  *RADIOLOGY REPORT*  Clinical Data: Weakness in the left lower extremity since early this morning. Hyperlipidemia, hypertension, and COPD.  CT HEAD WITHOUT CONTRAST  Technique:  Contiguous axial images were obtained from the base of the skull through the vertex without contrast.  Comparison: 09/02/2011.  Findings: There is no evidence for acute infarction, intracranial hemorrhage, mass lesion, hydrocephalus, or extra-axial fluid.  Mild atrophy is present.  There is mild to moderate chronic microvascular ischemic change.  The calvarium is intact.  Carotid atherosclerosis is noted in the siphon regions.  There is no sinus or mastoid disease.  Compared with priors, similar appearance is noted.  IMPRESSION: Mild atrophy and mild to moderate chronic microvascular ischemic change.  No visible acute stroke.   Original Report Authenticated By: Davonna Belling, M.D.    Mr Angiogram Head Wo Contrast  03/25/2012  *RADIOLOGY REPORT*  Clinical Data:  Awoke with left leg numbness and tingling.  High blood pressure and hyperlipidemia.  MRI BRAIN WITHOUT CONTRAST MRA HEAD WITHOUT CONTRAST  Technique: Multiplanar, multiecho pulse sequences  of the brain and surrounding structures were obtained according to standard protocol without intravenous contrast.  Angiographic images of the head were obtained using MRA technique without contrast.  Comparison: 03/25/2012 head CT.  No comparison MR.  MRI HEAD  Findings:  Small acute non hemorrhagic infarct posterior right corona radiata extending into the posterior aspect of the posterior limb of the right internal capsule.  No intracranial hemorrhage.  Mild to moderate small vessel disease type changes.  Global atrophy without hydrocephalus.  No intracranial mass lesion detected on this unenhanced exam.  Minimal anterior slip of C2 and C3 with mild cervical spondylotic changes.  Cervical medullary junction, pituitary region, pineal region and orbital structures unremarkable.  IMPRESSION: Small acute non hemorrhagic infarct posterior right corona radiata extending into the posterior aspect of the posterior limb of the right internal capsule.  Please see above.  MRA HEAD  Findings: Anterior circulation without medium or large size vessel significant stenosis or occlusion.  Prominent infundibulum posterior communicating artery region noted bilaterally without discrete aneurysm.  Middle cerebral artery branch vessel irregularity bilaterally slightly greater on the left.  Codominant vertebral arteries.  No significant stenosis of the distal vertebral arteries or basilar artery.  Irregularity and narrowing of the PICA greater on the left.  Nonvisualization AICAs.  Mild irregularity superior cerebellar artery bilaterally.  Moderate  narrowing proximal left posterior cerebral artery with marked narrowing proximal to the left posterior cerebral artery bifurcation and poor delineation distal branches.  Moderate to marked narrowing right posterior cerebral artery just proximal to the bifurcation.  Mild irregularity right posterior cerebral artery branch vessels.  IMPRESSION: Intracranial atherosclerotic type changes as  detailed above.  Results called to Johnnette Gourd resident in the emergency room 03/25/2012 5:04 p.m.   Original Report Authenticated By: Lacy Duverney, M.D.    Mr Brain Wo Contrast  03/25/2012  *RADIOLOGY REPORT*  Clinical Data:  Awoke with left leg numbness and tingling.  High blood pressure and hyperlipidemia.  MRI BRAIN WITHOUT CONTRAST MRA HEAD WITHOUT CONTRAST  Technique: Multiplanar, multiecho pulse sequences of the brain and surrounding structures were obtained according to standard protocol without intravenous contrast.  Angiographic images of the head were obtained using MRA technique without contrast.  Comparison: 03/25/2012 head CT.  No comparison MR.  MRI HEAD  Findings:  Small acute non hemorrhagic infarct posterior right corona radiata extending into the posterior aspect of the posterior limb of the right internal capsule.  No intracranial hemorrhage.  Mild to moderate small vessel disease type changes.  Global atrophy without hydrocephalus.  No intracranial mass lesion detected on this unenhanced exam.  Minimal anterior slip of C2 and C3 with mild cervical spondylotic changes.  Cervical medullary junction, pituitary region, pineal region and orbital structures unremarkable.  IMPRESSION: Small acute non hemorrhagic infarct posterior right corona radiata extending into the posterior aspect of the posterior limb of the right internal capsule.  Please see above.  MRA HEAD  Findings: Anterior circulation without medium or large size vessel significant stenosis or occlusion.  Prominent infundibulum posterior communicating artery region noted bilaterally without discrete aneurysm.  Middle cerebral artery branch vessel irregularity bilaterally slightly greater on the left.  Codominant vertebral arteries.  No significant stenosis of the distal vertebral arteries or basilar artery.  Irregularity and narrowing of the PICA greater on the left.  Nonvisualization AICAs.  Mild irregularity superior cerebellar artery  bilaterally.  Moderate  narrowing proximal left posterior cerebral artery with marked narrowing proximal to the left posterior cerebral artery bifurcation and poor delineation distal branches.  Moderate to marked narrowing right posterior cerebral artery just proximal to the bifurcation.  Mild irregularity right posterior cerebral artery branch vessels.  IMPRESSION: Intracranial atherosclerotic type changes as detailed above.  Results called to Johnnette Gourd resident in the emergency room 03/25/2012 5:04 p.m.   Original Report Authenticated By: Lacy Duverney, M.D.     Assessment/Plan: Diagnosis: nonhemorrhagic infarct posterior right corona radiata extending into the posterior aspect of the posterior limb of the right internal capsule 1. Does the need for close, 24 hr/day medical supervision in concert with the patient's rehab needs make it unreasonable for this patient to be served in a less intensive setting? Potentially 2. Co-Morbidities requiring supervision/potential complications: htn, copd, afib 3. Due to bladder management, bowel management, safety, skin/wound care, disease management, medication administration, pain management and patient education, does the patient require 24 hr/day rehab nursing? Potentially 4. Does the patient require coordinated care of a physician, rehab nurse, PT (1-2 hrs/day, 5 days/week) and OT (1-2 hrs/day, 5 days/week) to address physical and functional deficits in the context of the above medical diagnosis(es)?  Potentially Addressing deficits in the following areas: balance, endurance, locomotion, strength, transferring, bowel/bladder control, bathing, dressing, feeding and grooming 5. Can the patient actively participate in an intensive therapy program of at least 3 hrs of therapy per day at least 5 days per week? Potentially 6. The potential for patient to make measurable gains while on inpatient rehab is good and fair 7. Anticipated functional outcomes upon discharge  from inpatient rehab are n/a with PT, n/a with OT, n/a with SLP. 8. Estimated rehab length of stay to reach the above functional goals is: n/a 9. Does the patient have adequate social supports to accommodate these discharge functional goals? Potentially 10. Anticipated D/C setting: Home 11. Anticipated post D/C treatments: Outpt therapy 12. Overall Rehab/Functional Prognosis: excellent  RECOMMENDATIONS: This patient's condition is appropriate for continued rehabilitative care in the following setting: Outpt Patient has agreed to participate in recommended program. Potentially Note that insurance prior authorization may be required for reimbursement for recommended care.  Comment: Probably will advance enough physically where she can go home. Pt's son will be staying with her as well. She would prefer to go home with home health. I did recommend outpt therapies however.   Ranelle Oyster, MD, [place] [facility]     03/27/2012

## 2012-03-27 NOTE — Evaluation (Signed)
Speech Language Pathology Evaluation Patient Details Name: [name] MRN: [id number] DOB: 1935/01/05 Today's Date: 03/27/2012 Time: 4098-1191 SLP Time Calculation (min): 20 min  Problem List:  Patient Active Problem List  Diagnosis  . HYPOTHYROIDISM  . HYPERLIPIDEMIA  . DEPRESSION  . HYPERTENSION  . C O P D  . OSTEOARTHRITIS  . DYSPNEA  . Bradycardia  . CVA (cerebrovascular accident)  . A-fib   Past Medical History:  Past Medical History  Diagnosis Date  . Hypertension   . Allergy history unknown   . Sinus problem   . Arthritis   . IBS (irritable bowel syndrome)   . Fibromyalgia   . COPD, severe   . Depression   . Hyperlipidemia   . Osteoarthritis   . Fibromyalgia   . COPD (chronic obstructive pulmonary disease)   . A-fib    Past Surgical History:  Past Surgical History  Procedure Date  . Tubal ligation   . Appendectomy   . Tonsillectomy   . Hemorrhoid surgery   . Cholecystectomy    HPI:  Patient is a 78 y.o. female, admit on 1/28 with left leg numbness and weakness. MRI revealed small acute non-hemorrhagic  infarct. PMH: HTN, IBS, COPD, depression, A-fib, fibromyalgia.   Assessment / Plan / Recommendation Clinical Impression  Patient appears to be WFL-WNL for speech, cognitive function, and expressive and receptive language at basic and complex levels. Patient's son did not express any concerns or notice any changes in his mother since this CVA. Patient only expressed difficulty in mobility and strength related to left LE. Patient does not appear to be a danger to herself or others, she is able to independently make decisions and demonstrates appropriate anticipatory awarenes, related to her physical limitations from CVA.    SLP Assessment  Patient does not need any further Speech Lanaguage Pathology Services    Follow Up Recommendations  None    Frequency and Duration        Pertinent Vitals/Pain    SLP Goals  SLP Goals Potential to Achieve Goals:  Good  SLP Evaluation Prior Functioning  Cognitive/Linguistic Baseline: Within functional limits Type of Home: House Lives With: Alone Available Help at Discharge: Family (son: currently he is unemployed, and able to assist pt.) Vocation: Retired   IT consultant  Overall Cognitive Status: Appears within functional limits for tasks assessed Arousal/Alertness: Awake/alert Orientation Level: Oriented X4 Attention: Alternating Alternating Attention: Appears intact Memory: Appears intact Awareness: Appears intact Problem Solving: Appears intact Executive Function: Reasoning;Organizing;Decision Making Reasoning: Appears intact Organizing: Appears intact Decision Making: Appears intact Safety/Judgment: Appears intact    Comprehension  Auditory Comprehension Overall Auditory Comprehension: Appears within functional limits for tasks assessed    Expression Expression Primary Mode of Expression: Verbal Verbal Expression Overall Verbal Expression: Appears within functional limits for tasks assessed   Oral / Motor Oral Motor/Sensory Function Overall Oral Motor/Sensory Function: Appears within functional limits for tasks assessed Motor Speech Overall Motor Speech: Appears within functional limits for tasks assessed   GO     Pablo Lawrence 03/27/2012, 2:35 PM  Angela Nevin, MA, CCC-SLP [facility] Speech-Language Pathologist

## 2012-03-27 NOTE — Progress Notes (Signed)
Physical Therapy Treatment Patient Details Name: [name] MRN: [id number] DOB: May 17, 1934 Today's Date: 03/27/2012 Time: 6213-0865 PT Time Calculation (min): 29 min  PT Assessment / Plan / Recommendation Comments on Treatment Session  Pt very pleasant & motivated to participate in therapy.  She reports Lt side feeling stronger but it's evident to still be weak & fatigue with activity.  Pt did mobilize with decreased (A) compared to last PT progress note, but still needs cues for awareness of L LE fatigue + weakness.  Pt tends to move fast-- required cueing to slow down to ensure safety.  Per Dr Rosalyn Charters Consult note, pt requesting to d/c home at D/C.   Pt's son present entire session & he confirms he will be providing 24 hr (A)/(S) at home.   F/u recommendations updated at this time.      Follow Up Recommendations  Home health PT;Supervision/Assistance - 24 hour     Does the patient have the potential to tolerate intense rehabilitation     Barriers to Discharge        Equipment Recommendations  Rolling walker with 5" wheels    Recommendations for Other Services Rehab consult  Frequency Min 4X/week   Plan Discharge plan remains appropriate    Precautions / Restrictions Precautions Precautions: Fall Restrictions Weight Bearing Restrictions: No   Pertinent Vitals/Pain No pain reported.      Mobility  Bed Mobility Bed Mobility: Supine to Sit;Sitting - Scoot to Edge of Bed Supine to Sit: 5: Supervision;HOB flat Sitting - Scoot to Edge of Bed: 5: Supervision Details for Bed Mobility Assistance: Cues for increased use of L UE to assist with transitioning.   Transfers Transfers: Sit to Stand;Stand to Sit Sit to Stand: 4: Min guard;With upper extremity assist;With armrests;From bed;From chair/3-in-1;From toilet Stand to Sit: 4: Min guard;With upper extremity assist;With armrests;To chair/3-in-1;To toilet Details for Transfer Assistance: Cues for safe hand placement & technique.    Ambulation/Gait Ambulation/Gait Assistance: 4: Min assist Ambulation Distance (Feet): 120 Feet Assistive device: Rolling walker Ambulation/Gait Assistance Details: Cues for body positioning inside RW, tall posture, increased step height L LE, sequencing.   Pt still requires cueing for increased awareness of L LE fatigue & effects of fatigue (dragging L LE).   Gait Pattern: Step-to pattern;Step-through pattern;Decreased step length - right;Decreased step length - left;Decreased hip/knee flexion - left;Decreased dorsiflexion - left Stairs: No Modified Rankin (Stroke Patients Only) Pre-Morbid Rankin Score: No symptoms Modified Rankin: Moderately severe disability    Exercises General Exercises - Lower Extremity Ankle Circles/Pumps: AROM;Both;10 reps Rackley Arc Quad: AROM;Both;10 reps Hip ABduction/ADduction: AROM;Both;10 reps Straight Leg Raises: AROM;Both;10 reps Hip Flexion/Marching: AROM;Both;10 reps     PT Goals Acute Rehab PT Goals Time For Goal Achievement: 04/09/12 Potential to Achieve Goals: Good Pt will go Sit to Supine/Side: Independently PT Goal: Sit to Supine/Side - Progress: Progressing toward goal Pt will go Sit to Stand: Independently PT Goal: Sit to Stand - Progress: Progressing toward goal Pt will Transfer Bed to Chair/Chair to Bed: Independently Pt will Ambulate: 51 - 150 feet;with modified independence;with least restrictive assistive device PT Goal: Ambulate - Progress: Progressing toward goal Pt will Go Up / Down Stairs: 3-5 stairs;with supervision;with least restrictive assistive device  Visit Information  Last PT Received On: 03/27/12 Assistance Needed: +1    Subjective Data      Cognition  Overall Cognitive Status: Appears within functional limits for tasks assessed/performed Arousal/Alertness: Awake/alert Orientation Level: Appears intact for tasks assessed Behavior During Session: [facility]  for tasks performed    Balance  Balance Balance Assessed:  Yes Static Standing Balance Static Standing - Balance Support: Left upper extremity supported;Right upper extremity supported;No upper extremity supported Static Standing - Level of Assistance: 5: Stand by assistance Static Standing - Comment/# of Minutes: Pt performed standing eyes open/closed, feet together/apart, weight shifting, looking over shoulder L<>R, external perturbations in all directions at hips.    End of Session PT - End of Session Equipment Utilized During Treatment: Gait belt Activity Tolerance: Patient tolerated treatment well;Patient limited by fatigue Patient left: in chair;with call bell/phone within reach;with family/visitor present     Verdell Face, PTA [phone] 03/27/2012

## 2012-03-27 NOTE — Progress Notes (Signed)
Rehab admissions - Evaluated for possible admission.  Patient is refusing inpatient rehab and prefers to discharge home with [facility] therapies.  Dr. Sharl Ma is aware.  Jacquelynn Cree, CM is also aware.  Call me for questions.  #[phone]

## 2012-03-27 NOTE — Progress Notes (Signed)
Pt has been d/c and was wheeled off the floor in stable condition-----Mckinsey Keagle, rn

## 2012-03-27 NOTE — Progress Notes (Signed)
RN has reviewed d/c instructions with pt, pt has verbalized understanding, signed d/c papers and she is getting dressed and will be ready to leave as soon as she is done dressing.------Roshonda Sperl, rn

## 2012-03-27 NOTE — Progress Notes (Signed)
Chandel Zaun Ingold,PT Acute Rehabilitation [phone] (pager)  

## 2012-03-27 NOTE — Discharge Summary (Signed)
Physician Discharge Summary  SABA GOMM NFA:[id number] DOB: 01-22-1935 DOA: 03/25/2012  PCP: Ralene Ok, MD  Admit date: 03/25/2012 Discharge date: 03/27/2012  Time spent: 50 minutes  Recommendations for Outpatient Follow-up:  1. Follow up Neurology in 2 months 2. Follow PCP in 2 weeks, PCP has to follow 2-D echo results from the hospital  Discharge Diagnoses:  Principal Problem:  *CVA (cerebrovascular accident) Active Problems:  HYPOTHYROIDISM  HYPERLIPIDEMIA  DEPRESSION  HYPERTENSION  C O P D  A-fib   Discharge Condition: Stable  Diet recommendation:  Low salt diet  Filed Weights   03/25/12 2015  Weight: 72.439 kg (159 lb 11.2 oz)    History of present illness:  [name] is a 78 y.o. Caucasian female history of hypertension, depression, hyperlipidemia, fibromyalgia, COPD, A. fib on chronic anticoagulation, and irritable bowel syndrome who presents with the above complaints. Patient reports that she woke up at 4 o'clock this morning and noted that her left leg was numb and weak. She eventually made her way to the bathroom using a cane. She called her primary care physician's office at 830 a.m. who advised her to go to the ER but patient wanted to be seen by her primary care physician as a result was made an appointment today. After evaluation her primary care physician for outpatient likely had a stroke and had her brought to the emergency department for further evaluation.  Patient reports that she missed taking her doses of Pradaxa as she ran out of those medications and resumed the medication on 03/16/2012 after getting few samples through Dr. Annitta Jersey office. She finally had her medication come through the mail. She has not had any recent fevers, chills, nausea, vomiting, chest pain, abdominal pain, diarrhea, headaches or vision changes. She does report having chronic shortness of breath at baseline but attributes that to her COPD. MRI of her brain shows small acute  nonhemorrhagic infarct as a result the hospitalist service was asked to admit the patient for further care and management since patient was at a therapeutic for [facility].   Hospital Course:  CVA Patient came with numbness and weakness of her left leg, MRI showed small pustule right coronary data infarct extending into the posterior aspect of the posterior limb of the right internal capsule. Patient was taking pradaxa, and did not eat for few days as she did not refill the medication. Patient has a history of A. fib. As per neurology recommendation patient has been started on xarelto and we have stopped pradaxa.  This was confirmed with patient's cardiologist Dr.Harwani. Patient will get outpatient physical therapy. Carotid Dopplers are negative, 2-D echo was done and the results are pending at this time.  Atrial fibrillation Continue amiodarone and xarelto.  COPD  Continue spiriva, Dulera.   Hypothyroidism  Continue synthroid    Procedures:  Carotid Dopplers: Did not show significant stenosis  2-D echo: Results are pending at this time  Consultations:  Neurology  Discharge Exam: Filed Vitals:   03/27/12 0200 03/27/12 0600 03/27/12 0810 03/27/12 1025  BP: 150/50 150/53  151/61  Pulse: 66 65  66  Temp: 97.5 F (36.4 C) 97.6 F (36.4 C)  98.2 F (36.8 C)  TempSrc:    Oral  Resp: 18 18  16   Height:      Weight:      SpO2: 93% 92% 96% 94%    General: Appears in no acute distress Cardiovascular: S1-S2 is regular Respiratory: Clear bilaterally  Discharge Instructions  Discharge Orders  Future Appointments: Provider: Department: Dept Phone: Center:   11/05/2012 10:00 AM Waymon Budge, MD [facility] [phone] None     Future Orders Please Complete By Expires   Diet - low sodium heart healthy      Increase activity slowly          Medication List     As of 03/27/2012 12:10 PM    STOP taking these medications         PRADAXA 150 MG Caps   Generic  drug: dabigatran      TAKE these medications         acetaminophen 325 MG tablet   Commonly known as: TYLENOL   Take 650 mg by mouth every 6 (six) hours as needed. For pain/fever      amiodarone 200 MG tablet   Commonly known as: PACERONE   Take 1 tablet by mouth daily.      amLODipine 2.5 MG tablet   Commonly known as: NORVASC   Take 2.5 mg by mouth daily.      furosemide 40 MG tablet   Commonly known as: LASIX   Take 40 mg by mouth daily.      LORazepam 0.5 MG tablet   Commonly known as: ATIVAN   Take 0.5 mg by mouth 2 (two) times daily as needed.      losartan 100 MG tablet   Commonly known as: COZAAR   Take 100 mg by mouth daily.      magnesium oxide 400 MG tablet   Commonly known as: MAG-OX   Take 400 mg by mouth 2 (two) times daily.      mometasone-formoterol 100-5 MCG/ACT Aero   Commonly known as: DULERA   Inhale 2 puffs into the lungs 2 (two) times daily.      multivitamin tablet   Take 1 tablet by mouth daily.      pregabalin 50 MG capsule   Commonly known as: LYRICA   Take 50 mg by mouth daily as needed. For pain      PROAIR HFA 108 (90 BASE) MCG/ACT inhaler   Generic drug: albuterol   Inhale 2 puffs into the lungs every 4 (four) hours as needed. For shortness of breath      Rivaroxaban 20 MG Tabs   Commonly known as: XARELTO   Take 1 tablet (20 mg total) by mouth daily with supper.      spironolactone 25 MG tablet   Commonly known as: ALDACTONE   Take 25 mg by mouth daily.      SYNTHROID 150 MCG tablet   Generic drug: levothyroxine   Take 1 tablet by mouth daily.      tiotropium 18 MCG inhalation capsule   Commonly known as: SPIRIVA   Place 18 mcg into inhaler and inhale daily.           Follow-up Information    Follow up with Gates Rigg, MD. Schedule an appointment as soon as possible for a visit in 2 months. (stroke clinic)    Contact information:   [address] [facility] [place]  [address] [phone]       Follow up with Ralene Ok, MD. In 2 weeks.   Contact information:   [address] [place] [address] [phone]           The results of significant diagnostics from this hospitalization (including imaging, microbiology, ancillary and laboratory) are listed below for reference.    Significant Diagnostic  Studies: Dg Chest 2 View  03/25/2012  *RADIOLOGY REPORT*  Clinical Data: 78 year old female with CVA.  CHEST - 2 VIEW  Comparison: 11/06/2011 and prior chest radiographs  Findings: Mild cardiomegaly and COPD/emphysema again noted. There is no evidence of focal airspace disease, pulmonary edema, suspicious pulmonary nodule/mass, pleural effusion, or pneumothorax. No acute bony abnormalities are identified.  IMPRESSION: No evidence of acute cardiopulmonary disease.  Cardiomegaly and COPD/emphysema.   Original Report Authenticated By: Harmon Pier, M.D.    Ct Head Wo Contrast  03/25/2012  *RADIOLOGY REPORT*  Clinical Data: Weakness in the left lower extremity since early this morning. Hyperlipidemia, hypertension, and COPD.  CT HEAD WITHOUT CONTRAST  Technique:  Contiguous axial images were obtained from the base of the skull through the vertex without contrast.  Comparison: 09/02/2011.  Findings: There is no evidence for acute infarction, intracranial hemorrhage, mass lesion, hydrocephalus, or extra-axial fluid.  Mild atrophy is present.  There is mild to moderate chronic microvascular ischemic change.  The calvarium is intact.  Carotid atherosclerosis is noted in the siphon regions.  There is no sinus or mastoid disease.  Compared with priors, similar appearance is noted.  IMPRESSION: Mild atrophy and mild to moderate chronic microvascular ischemic change.  No visible acute stroke.   Original Report Authenticated By: Davonna Belling, M.D.    Mr Angiogram Head Wo Contrast  03/25/2012  *RADIOLOGY REPORT*  Clinical Data:  Awoke with left leg numbness and tingling.   High blood pressure and hyperlipidemia.  MRI BRAIN WITHOUT CONTRAST MRA HEAD WITHOUT CONTRAST  Technique: Multiplanar, multiecho pulse sequences of the brain and surrounding structures were obtained according to standard protocol without intravenous contrast.  Angiographic images of the head were obtained using MRA technique without contrast.  Comparison: 03/25/2012 head CT.  No comparison MR.  MRI HEAD  Findings:  Small acute non hemorrhagic infarct posterior right corona radiata extending into the posterior aspect of the posterior limb of the right internal capsule.  No intracranial hemorrhage.  Mild to moderate small vessel disease type changes.  Global atrophy without hydrocephalus.  No intracranial mass lesion detected on this unenhanced exam.  Minimal anterior slip of C2 and C3 with mild cervical spondylotic changes.  Cervical medullary junction, pituitary region, pineal region and orbital structures unremarkable.  IMPRESSION: Small acute non hemorrhagic infarct posterior right corona radiata extending into the posterior aspect of the posterior limb of the right internal capsule.  Please see above.  MRA HEAD  Findings: Anterior circulation without medium or large size vessel significant stenosis or occlusion.  Prominent infundibulum posterior communicating artery region noted bilaterally without discrete aneurysm.  Middle cerebral artery branch vessel irregularity bilaterally slightly greater on the left.  Codominant vertebral arteries.  No significant stenosis of the distal vertebral arteries or basilar artery.  Irregularity and narrowing of the PICA greater on the left.  Nonvisualization AICAs.  Mild irregularity superior cerebellar artery bilaterally.  Moderate  narrowing proximal left posterior cerebral artery with marked narrowing proximal to the left posterior cerebral artery bifurcation and poor delineation distal branches.  Moderate to marked narrowing right posterior cerebral artery just proximal to  the bifurcation.  Mild irregularity right posterior cerebral artery branch vessels.  IMPRESSION: Intracranial atherosclerotic type changes as detailed above.  Results called to Johnnette Gourd resident in the emergency room 03/25/2012 5:04 p.m.   Original Report Authenticated By: Lacy Duverney, M.D.    Mr Brain Wo Contrast  03/25/2012  *RADIOLOGY REPORT*  Clinical Data:  Awoke with left leg numbness  and tingling.  High blood pressure and hyperlipidemia.  MRI BRAIN WITHOUT CONTRAST MRA HEAD WITHOUT CONTRAST  Technique: Multiplanar, multiecho pulse sequences of the brain and surrounding structures were obtained according to standard protocol without intravenous contrast.  Angiographic images of the head were obtained using MRA technique without contrast.  Comparison: 03/25/2012 head CT.  No comparison MR.  MRI HEAD  Findings:  Small acute non hemorrhagic infarct posterior right corona radiata extending into the posterior aspect of the posterior limb of the right internal capsule.  No intracranial hemorrhage.  Mild to moderate small vessel disease type changes.  Global atrophy without hydrocephalus.  No intracranial mass lesion detected on this unenhanced exam.  Minimal anterior slip of C2 and C3 with mild cervical spondylotic changes.  Cervical medullary junction, pituitary region, pineal region and orbital structures unremarkable.  IMPRESSION: Small acute non hemorrhagic infarct posterior right corona radiata extending into the posterior aspect of the posterior limb of the right internal capsule.  Please see above.  MRA HEAD  Findings: Anterior circulation without medium or large size vessel significant stenosis or occlusion.  Prominent infundibulum posterior communicating artery region noted bilaterally without discrete aneurysm.  Middle cerebral artery branch vessel irregularity bilaterally slightly greater on the left.  Codominant vertebral arteries.  No significant stenosis of the distal vertebral arteries or basilar  artery.  Irregularity and narrowing of the PICA greater on the left.  Nonvisualization AICAs.  Mild irregularity superior cerebellar artery bilaterally.  Moderate  narrowing proximal left posterior cerebral artery with marked narrowing proximal to the left posterior cerebral artery bifurcation and poor delineation distal branches.  Moderate to marked narrowing right posterior cerebral artery just proximal to the bifurcation.  Mild irregularity right posterior cerebral artery branch vessels.  IMPRESSION: Intracranial atherosclerotic type changes as detailed above.  Results called to Johnnette Gourd resident in the emergency room 03/25/2012 5:04 p.m.   Original Report Authenticated By: Lacy Duverney, M.D.     Microbiology: No results found for this or any previous visit (from the past 240 hour(s)).   Labs: Basic Metabolic Panel:  Lab 03/25/12 9563  NA 139  K 3.8  CL 98  CO2 26  GLUCOSE 106*  BUN 22  CREATININE 0.86  CALCIUM 9.9  MG --  PHOS --   Liver Function Tests: No results found for this basename: AST:5,ALT:5,ALKPHOS:5,BILITOT:5,PROT:5,ALBUMIN:5 in the last 168 hours No results found for this basename: LIPASE:5,AMYLASE:5 in the last 168 hours No results found for this basename: AMMONIA:5 in the last 168 hours CBC:  Lab 03/25/12 1128  WBC 10.7*  NEUTROABS --  HGB 14.2  HCT 43.0  MCV 99.5  PLT 353   Cardiac Enzymes: No results found for this basename: CKTOTAL:5,CKMB:5,CKMBINDEX:5,TROPONINI:5 in the last 168 hours BNP: BNP (last 3 results) No results found for this basename: PROBNP:3 in the last 8760 hours CBG: No results found for this basename: GLUCAP:5 in the last 168 hours     Signed:  Alix Lahmann S  [facility] 03/27/2012, 12:10 PM

## 2012-04-03 ENCOUNTER — Ambulatory Visit: Payer: Medicare Other | Attending: Internal Medicine | Admitting: Physical Therapy

## 2012-04-03 ENCOUNTER — Ambulatory Visit: Payer: Medicare Other | Admitting: Occupational Therapy

## 2012-04-03 ENCOUNTER — Ambulatory Visit: Payer: Medicare Other | Admitting: Physical Therapy

## 2012-04-03 ENCOUNTER — Ambulatory Visit: Payer: Medicare Other | Admitting: *Deleted

## 2012-04-03 DIAGNOSIS — R5381 Other malaise: Secondary | ICD-10-CM | POA: Insufficient documentation

## 2012-04-03 DIAGNOSIS — M6281 Muscle weakness (generalized): Secondary | ICD-10-CM | POA: Insufficient documentation

## 2012-04-03 DIAGNOSIS — R279 Unspecified lack of coordination: Secondary | ICD-10-CM | POA: Insufficient documentation

## 2012-04-03 DIAGNOSIS — I69959 Hemiplegia and hemiparesis following unspecified cerebrovascular disease affecting unspecified side: Secondary | ICD-10-CM | POA: Insufficient documentation

## 2012-04-03 DIAGNOSIS — R269 Unspecified abnormalities of gait and mobility: Secondary | ICD-10-CM | POA: Insufficient documentation

## 2012-04-03 DIAGNOSIS — Z5189 Encounter for other specified aftercare: Secondary | ICD-10-CM | POA: Insufficient documentation

## 2012-04-03 DIAGNOSIS — I69998 Other sequelae following unspecified cerebrovascular disease: Secondary | ICD-10-CM | POA: Insufficient documentation

## 2012-04-09 ENCOUNTER — Ambulatory Visit: Payer: Medicare Other | Admitting: Physical Therapy

## 2012-04-11 ENCOUNTER — Ambulatory Visit: Payer: Medicare Other | Admitting: Physical Therapy

## 2012-04-15 ENCOUNTER — Ambulatory Visit: Payer: Medicare Other | Admitting: Physical Therapy

## 2012-04-17 ENCOUNTER — Ambulatory Visit: Payer: Medicare Other | Admitting: Physical Therapy

## 2012-04-23 ENCOUNTER — Ambulatory Visit: Payer: Medicare Other | Admitting: Physical Therapy

## 2012-04-23 ENCOUNTER — Ambulatory Visit: Payer: Medicare Other | Admitting: Occupational Therapy

## 2012-04-29 ENCOUNTER — Ambulatory Visit: Payer: Medicare Other | Admitting: Occupational Therapy

## 2012-04-29 ENCOUNTER — Ambulatory Visit: Payer: Medicare Other | Admitting: Physical Therapy

## 2012-05-01 ENCOUNTER — Ambulatory Visit: Payer: Medicare Other | Admitting: Occupational Therapy

## 2012-05-01 ENCOUNTER — Ambulatory Visit: Payer: Medicare Other | Attending: Internal Medicine | Admitting: Physical Therapy

## 2012-05-01 DIAGNOSIS — R269 Unspecified abnormalities of gait and mobility: Secondary | ICD-10-CM | POA: Insufficient documentation

## 2012-05-01 DIAGNOSIS — R279 Unspecified lack of coordination: Secondary | ICD-10-CM | POA: Insufficient documentation

## 2012-05-01 DIAGNOSIS — Z5189 Encounter for other specified aftercare: Secondary | ICD-10-CM | POA: Insufficient documentation

## 2012-05-01 DIAGNOSIS — M6281 Muscle weakness (generalized): Secondary | ICD-10-CM | POA: Insufficient documentation

## 2012-05-01 DIAGNOSIS — I69998 Other sequelae following unspecified cerebrovascular disease: Secondary | ICD-10-CM | POA: Insufficient documentation

## 2012-05-01 DIAGNOSIS — R5381 Other malaise: Secondary | ICD-10-CM | POA: Insufficient documentation

## 2012-05-01 DIAGNOSIS — I69959 Hemiplegia and hemiparesis following unspecified cerebrovascular disease affecting unspecified side: Secondary | ICD-10-CM | POA: Insufficient documentation

## 2012-05-06 ENCOUNTER — Ambulatory Visit: Payer: Medicare Other | Admitting: Physical Therapy

## 2012-05-08 ENCOUNTER — Ambulatory Visit: Payer: Medicare Other | Admitting: Physical Therapy

## 2012-05-14 ENCOUNTER — Ambulatory Visit: Payer: Medicare Other | Admitting: Occupational Therapy

## 2012-05-14 ENCOUNTER — Ambulatory Visit: Payer: Medicare Other | Admitting: Physical Therapy

## 2012-05-16 ENCOUNTER — Ambulatory Visit: Payer: Medicare Other | Admitting: Physical Therapy

## 2012-05-16 ENCOUNTER — Ambulatory Visit: Payer: Medicare Other | Admitting: Occupational Therapy

## 2012-08-21 ENCOUNTER — Telehealth: Payer: Self-pay | Admitting: Internal Medicine

## 2012-08-21 MED ORDER — TIOTROPIUM BROMIDE MONOHYDRATE 18 MCG IN CAPS: 18.0000 ug | ORAL_CAPSULE | Freq: Every day | RESPIRATORY_TRACT | Status: DC

## 2012-08-21 MED ORDER — MOMETASONE FURO-FORMOTEROL FUM 100-5 MCG/ACT IN AERO: 2.0000 | INHALATION_SPRAY | Freq: Two times a day (BID) | RESPIRATORY_TRACT | Status: DC

## 2012-08-21 NOTE — Telephone Encounter (Signed)
Dr Maple Hudson, please advise if okay to provide dulera and spiriva samples Last ov 11/06/11  No ov with CDY pending

## 2012-08-21 NOTE — Telephone Encounter (Signed)
May have 1 sample each of Dulera 100 and Spiriva. We cannot keep her supplied with samples. Patient has had CVA since last here. She needs to be seen ROV for f/u here, or get her medicines through her primary physician.

## 2012-08-21 NOTE — Telephone Encounter (Signed)
Spoke with pt and notified of recs per CDY Pt verbalized understanding  Would like to keep the planned ov with she has on 11/05/12 and will call sooner if needed  1 sample of each up front for the pt

## 2012-09-24 ENCOUNTER — Ambulatory Visit: Payer: Self-pay | Admitting: Nurse Practitioner

## 2012-11-05 ENCOUNTER — Encounter: Payer: Self-pay | Admitting: Internal Medicine

## 2012-11-05 ENCOUNTER — Ambulatory Visit (INDEPENDENT_AMBULATORY_CARE_PROVIDER_SITE_OTHER): Payer: Medicare Other | Admitting: Internal Medicine

## 2012-11-05 VITALS — BP 140/80 | HR 76 | Ht 63.0 in | Wt 158.6 lb

## 2012-11-05 DIAGNOSIS — J438 Other emphysema: Secondary | ICD-10-CM

## 2012-11-05 DIAGNOSIS — J439 Emphysema, unspecified: Secondary | ICD-10-CM

## 2012-11-05 DIAGNOSIS — Z23 Encounter for immunization: Secondary | ICD-10-CM

## 2012-11-05 MED ORDER — MOMETASONE FURO-FORMOTEROL FUM 100-5 MCG/ACT IN AERO: 2.0000 | INHALATION_SPRAY | Freq: Two times a day (BID) | RESPIRATORY_TRACT | Status: DC

## 2012-11-05 MED ORDER — TIOTROPIUM BROMIDE MONOHYDRATE 18 MCG IN CAPS: 18.0000 ug | ORAL_CAPSULE | Freq: Every day | RESPIRATORY_TRACT | Status: DC

## 2012-11-05 NOTE — Progress Notes (Signed)
Patient ID: [name], female    DOB: 1934/12/15, 78 y.o.   MRN: [id number]  HPI 08/02/10- 26 yoF former smoker followed for dyspnea, COPD,  Complicated by hypothyroid, depression, HBP. Last here January 31, 2010- note reviewed. Here with son. Just out of hospital for hyponatremia and bronchitis. We discussed early return to her PCP Dr Ludwig Clarks. She was fine coming out of the hospital, while on prednisone. Son thinks being off prednisone is associated with her sense of fatigue.  Nebs helped in hospital- we compared her current meds.  Discussed her slow heart rate- she has appt in 2 days with Dr Sharyn Lull.  Denies dizzy, syncope.  11/07/10-  60 yoF former smoker followed for dyspnea, COPD,  Complicated by hypothyroid, depression, HBP, Hx PAfib. Stressed- daughter died in 2022/09/06- aged 55- bowel obstruction. Last visit here she was sent to hosp for 2 days w/ AFib.  Breathing usually ok in [facility], but easily dyspneic outdoors. Daily nap. Denies daily cough- scant clear. Denies chest pain, palpitation.  She quit smoking in 2006. Pulmonary rehab did help, but walking very little since.  Declines flu vax- will get from Dr Ludwig Clarks.  CXR-07/09/2010 hyperinflation but no active disease. Atherosclerosis noted in the aorta.  05/07/11- 83 yoF former smoker followed for dyspnea, COPD,  Complicated by hypothyroid, depression, HBP. Good winter with no significant respiratory infections. Denies routine cough or wheeze. She has done pulmonary rehabilitation in the past but is not regularly exercising now. She cleans her house one small project at a time and paces herself generally. We expressed concern about her blood pressure and she says it runs around 137 systolic at home. PFT-05/07/2011-severe obstructive airways disease with insignificant response to bronchodilator, air trapping. Diffusion is moderately reduced. FEV1 0.76/42%, FEV1/FVC 0.41.  11/06/11- 76 yoF former smoker followed for dyspnea, COPD,  Complicated by  hypothyroid, depression, HBP. SOB same,worse w/humid.no cough or wheeze ER visit July 6 for hypertension. Since then has felt more stable. Breathing has been controlled with no cough and she does better with Advair. Occasional minor hoarseness. She had tried returning to pulmonary rehabilitation but was stopped by her hypertension. History of atrial fibrillation on amiodarone. Gets flu shot at primary office COPD assessment test (CAT) score 9/40  11/05/12- 76 yoF former smoker followed for dyspnea, COPD,  Complicated by hypothyroid, depression, HBP, AFib/ Xarelto/ L body CVA-resolved. FOLLOWS [id number] is unchanged since last OV. Pt states is slightly SOB  but also feeling very anxious today. Denies cough or wheeze. Doing well with current medications. Paces herself during exertion. Finished cardiac rehabilitation. 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 Resolved. CXR 03/25/12 IMPRESSION:  No evidence of acute cardiopulmonary disease.  Cardiomegaly and COPD/emphysema.  Original Report Authenticated By: Harmon Pier, M.D.  Review of Systems-see HPI Constitutional:   No weight loss, night sweats,  Fevers, chills, fatigue, lassitude. HEENT:   No headaches,  Difficulty swallowing,  Tooth/dental problems,  Sore throat,                No sneezing, itching, ear ache, nasal congestion, post nasal drip,  CV:  No chest pain,  Orthopnea, PND, swelling in lower extremities, anasarca, dizziness, palpitations GI  No heartburn, indigestion, abdominal pain, nausea, vomiting,  Resp: As per HPI.  No excess mucus, no productive cough,  No non-productive cough,  No coughing up of blood.  No change in color of mucus.  No wheezing.    Skin: no rash or lesions. GU:  MS:  No joint pain or swelling.  Psych:  No change in mood or affect. No depression or anxiety.  No memory loss.    Objective:   Physical Exam General- Alert, Oriented, Affect-appropriate, Distress- none acute Skin-  rash-none, lesions- none, excoriation- none Lymphadenopathy- none Head- atraumatic            Eyes- Gross vision intact, PERRLA, conjunctivae clear secretions            Ears- Hearing, canals            Nose- Clear, Septal dev, mucus, polyps, erosion, perforation             Throat- Mallampati II , mucosa clear , drainage- none, tonsils- atrophic Neck- flexible , trachea midline, no stridor , thyroid nl, carotid no bruit Chest - symmetrical excursion , unlabored           Heart/CV- RRR , ?trace AS murmur , no gallop  , no rub, nl s1 s2                           - JVD- none , edema- none, stasis changes- none, varices- none           Lung- +clear/ distant, unlabored , wheeze- none, cough- none , dullness-none, rub- none           Chest wall-  Abd- Br/ Gen/ Rectal- Not done, not indicated Extrem- cyanosis- none, clubbing, none, atrophy- none, strength- nl Neuro- grossly intact to observation

## 2012-11-05 NOTE — Patient Instructions (Addendum)
Try walking and staying active to hold onto your stamina  Don't forget that flu shot!  Sample Spiriva and Dulera 100 if available  Please call as needed

## 2012-11-13 ENCOUNTER — Other Ambulatory Visit: Payer: Self-pay

## 2012-11-13 DIAGNOSIS — Z1231 Encounter for screening mammogram for malignant neoplasm of breast: Secondary | ICD-10-CM

## 2012-11-14 NOTE — Assessment & Plan Note (Signed)
Adequately controlled. Continue to watch for cardiac contribution to dyspnea. Plan-medications were reviewed and continued. To get flu shot at primary office

## 2012-12-11 ENCOUNTER — Ambulatory Visit
Admission: RE | Admit: 2012-12-11 | Discharge: 2012-12-11 | Disposition: A | Discharge status: Home / Self Care | Payer: Medicare Other | Source: Ambulatory Visit

## 2012-12-11 DIAGNOSIS — Z1231 Encounter for screening mammogram for malignant neoplasm of breast: Secondary | ICD-10-CM

## 2013-03-17 ENCOUNTER — Telehealth: Payer: Self-pay | Admitting: Internal Medicine

## 2013-03-17 MED ORDER — MOMETASONE FURO-FORMOTEROL FUM 100-5 MCG/ACT IN AERO: 2.0000 | INHALATION_SPRAY | Freq: Two times a day (BID) | RESPIRATORY_TRACT | Status: AC

## 2013-03-17 NOTE — Telephone Encounter (Signed)
Refill sent. Pt is aware. Cori Henningsen, CMA  

## 2013-05-01 ENCOUNTER — Other Ambulatory Visit (HOSPITAL_COMMUNITY): Payer: Self-pay | Admitting: Cardiology

## 2013-05-01 DIAGNOSIS — R079 Chest pain, unspecified: Secondary | ICD-10-CM

## 2013-05-08 ENCOUNTER — Ambulatory Visit (HOSPITAL_COMMUNITY)
Admission: RE | Admit: 2013-05-08 | Discharge: 2013-05-08 | Disposition: A | Discharge status: Home / Self Care | Payer: Medicare Other | Source: Ambulatory Visit | Attending: Cardiology | Admitting: Cardiology

## 2013-05-08 ENCOUNTER — Encounter (HOSPITAL_COMMUNITY)
Admission: RE | Admit: 2013-05-08 | Discharge: 2013-05-08 | Disposition: A | Discharge status: Home / Self Care | Payer: Medicare Other | Source: Ambulatory Visit | Attending: Cardiology | Admitting: Cardiology

## 2013-05-08 ENCOUNTER — Other Ambulatory Visit: Payer: Self-pay

## 2013-05-08 DIAGNOSIS — R079 Chest pain, unspecified: Secondary | ICD-10-CM

## 2013-05-08 MED ORDER — REGADENOSON 0.4 MG/5ML IV SOLN
0.4000 mg | Freq: Once | INTRAVENOUS | Status: AC
  Administered 2013-05-08: 0.4 mg via INTRAVENOUS

## 2013-05-08 MED ORDER — TECHNETIUM TC 99M SESTAMIBI GENERIC - CARDIOLITE
10.0000 | Freq: Once | INTRAVENOUS | Status: AC | PRN
  Administered 2013-05-08: 10 via INTRAVENOUS

## 2013-05-08 MED ORDER — REGADENOSON 0.4 MG/5ML IV SOLN
INTRAVENOUS | Status: AC
  Filled 2013-05-08: qty 5

## 2013-05-08 MED ORDER — TECHNETIUM TC 99M SESTAMIBI GENERIC - CARDIOLITE
30.0000 | Freq: Once | INTRAVENOUS | Status: AC | PRN
  Administered 2013-05-08: 30 via INTRAVENOUS

## 2013-05-14 ENCOUNTER — Ambulatory Visit
Admission: RE | Admit: 2013-05-14 | Discharge: 2013-05-14 | Disposition: A | Discharge status: Home / Self Care | Payer: Medicare Other | Source: Ambulatory Visit | Attending: Internal Medicine | Admitting: Internal Medicine

## 2013-05-14 ENCOUNTER — Other Ambulatory Visit: Payer: Self-pay | Admitting: Internal Medicine

## 2013-05-14 DIAGNOSIS — J449 Chronic obstructive pulmonary disease, unspecified: Secondary | ICD-10-CM

## 2013-11-06 ENCOUNTER — Encounter: Payer: Self-pay | Admitting: Internal Medicine

## 2013-11-06 ENCOUNTER — Ambulatory Visit (INDEPENDENT_AMBULATORY_CARE_PROVIDER_SITE_OTHER): Payer: Medicare Other | Admitting: Internal Medicine

## 2013-11-06 VITALS — BP 124/64 | HR 57 | Ht 63.0 in | Wt 153.0 lb

## 2013-11-06 DIAGNOSIS — J439 Emphysema, unspecified: Secondary | ICD-10-CM

## 2013-11-06 NOTE — Patient Instructions (Addendum)
We can continue present meds  Sample Dulera 100   2 puffs then rinse mouth, twice daily  Sample Spiriva- either form as available  Please remember to get your flu shot from your primary doctor  Please call as needed

## 2013-11-06 NOTE — Progress Notes (Signed)
Patient ID: [name], female    DOB: Jul 27, 1934, 79 y.o.   MRN: [id number]  HPI 08/02/10- 26 yoF former smoker followed for dyspnea, COPD,  Complicated by hypothyroid, depression, HBP. Last here January 31, 2010- note reviewed. Here with son. Just out of hospital for hyponatremia and bronchitis. We discussed early return to her PCP Dr Mellody Drown. She was fine coming out of the hospital, while on prednisone. Son thinks being off prednisone is associated with her sense of fatigue.  Nebs helped in hospital- we compared her current meds.  Discussed her slow heart rate- she has appt in 2 days with Dr Terrence Dupont.  Denies dizzy, syncope.  11/07/10-  57 yoF former smoker followed for dyspnea, COPD,  Complicated by hypothyroid, depression, HBP, Hx PAfib. Stressed- daughter died in 09/09/2022- aged 68- bowel obstruction. Last visit here she was sent to hosp for 2 days w/ AFib.  Breathing usually ok in [facility], but easily dyspneic outdoors. Daily nap. Denies daily cough- scant clear. Denies chest pain, palpitation.  She quit smoking in 2006. Pulmonary rehab did help, but walking very little since.  Declines flu vax- will get from Dr Mellody Drown.  CXR-07/09/2010 hyperinflation but no active disease. Atherosclerosis noted in the aorta.  05/07/11- 46 yoF former smoker followed for dyspnea, COPD,  Complicated by hypothyroid, depression, HBP. Good winter with no significant respiratory infections. Denies routine cough or wheeze. She has done pulmonary rehabilitation in the past but is not regularly exercising now. She cleans her house one small project at a time and paces herself generally. We expressed concern about her blood pressure and she says it runs around 616 systolic at home. PFT-05/07/2011-severe obstructive airways disease with insignificant response to bronchodilator, air trapping. Diffusion is moderately reduced. FEV1 0.76/42%, FEV1/FVC 0.41.  11/06/11- 76 yoF former smoker followed for dyspnea, COPD,  Complicated by  hypothyroid, depression, HBP. SOB same,worse w/humid.no cough or wheeze ER visit July 6 for hypertension. Since then has felt more stable. Breathing has been controlled with no cough and she does better with Advair. Occasional minor hoarseness. She had tried returning to pulmonary rehabilitation but was stopped by her hypertension. History of atrial fibrillation on amiodarone. Gets flu shot at primary office COPD assessment test (CAT) score 9/40  11/05/12- 76 yoF former smoker followed for dyspnea, COPD,  Complicated by hypothyroid, depression, HBP, AFib/ Xarelto/ L body CVA-resolved. FOLLOWS [id number] is unchanged since last OV. Pt states is slightly SOB  but also feeling very anxious today. Denies cough or wheeze. Doing well with current medications. Paces herself during exertion. Finished cardiac rehabilitation. 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 Resolved. CXR 03/25/12 IMPRESSION:  No evidence of acute cardiopulmonary disease.  Cardiomegaly and COPD/emphysema.  Original Report Authenticated By: Margarette Canada, M.D.  11/06/13- 61 yoF former smoker followed for dyspnea, COPD,  Complicated by hypothyroid, depression, HBP, AFib/ Xarelto/ L body CVA-resolved. FOLLOWS FOR: pt c/o occasional SOB with exertion.  Denies cough, congestion.    CXR 05/14/13 IMPRESSION:  Emphysema. No active lung disease.  Electronically Signed  By: Ivar Drape M.D.  On: 05/14/2013 13:00   Review of Systems-see HPI Constitutional:   No weight loss, night sweats,  Fevers, chills, fatigue, lassitude. HEENT:   No headaches,  Difficulty swallowing,  Tooth/dental problems,  Sore throat,                No sneezing, itching, ear ache, nasal congestion, post nasal drip,  CV:  No chest pain,  Orthopnea, PND,  swelling in lower extremities, anasarca, dizziness, palpitations GI  No heartburn, indigestion, abdominal pain, nausea, vomiting,  Resp: As per HPI.  No excess mucus, no productive cough,  No  non-productive cough,  No coughing up of blood.  No change in color of mucus.  No wheezing.    Skin: no rash or lesions. GU:   MS:  No joint pain or swelling.  Psych:  No change in mood or affect. No depression or anxiety.  No memory loss.    Objective:   Physical Exam General- Alert, Oriented, Affect-appropriate, Distress- none acute Skin- rash-none, lesions- none, excoriation- none Lymphadenopathy- none Head- atraumatic            Eyes- Gross vision intact, PERRLA, conjunctivae clear secretions            Ears- Hearing, canals            Nose- Clear, Septal dev, mucus, polyps, erosion, perforation             Throat- Mallampati II , mucosa clear , drainage- none, tonsils- atrophic Neck- flexible , trachea midline, no stridor , thyroid nl, carotid no bruit Chest - symmetrical excursion , unlabored           Heart/CV- RRR , ?trace AS murmur , no gallop  , no rub, nl s1 s2                           - JVD- none , edema- none, stasis changes- none, varices- none           Lung- +clear/ distant, unlabored , wheeze- none, cough- none , dullness-none, rub- none           Chest wall-  Abd- Br/ Gen/ Rectal- Not done, not indicated Extrem- cyanosis- none, clubbing, none, atrophy- none, strength- nl Neuro- grossly intact to observation

## 2014-03-22 ENCOUNTER — Other Ambulatory Visit: Payer: Self-pay | Admitting: Gastroenterology

## 2014-11-11 ENCOUNTER — Encounter: Payer: Self-pay | Admitting: Internal Medicine

## 2014-11-11 ENCOUNTER — Ambulatory Visit (INDEPENDENT_AMBULATORY_CARE_PROVIDER_SITE_OTHER): Payer: Medicare Other | Admitting: Internal Medicine

## 2014-11-11 ENCOUNTER — Ambulatory Visit (INDEPENDENT_AMBULATORY_CARE_PROVIDER_SITE_OTHER)
Admission: RE | Admit: 2014-11-11 | Discharge: 2014-11-11 | Disposition: A | Discharge status: Home / Self Care | Payer: Medicare Other | Source: Ambulatory Visit | Attending: Internal Medicine | Admitting: Internal Medicine

## 2014-11-11 VITALS — BP 138/80 | HR 56 | Ht 63.0 in | Wt 157.2 lb

## 2014-11-11 DIAGNOSIS — J449 Chronic obstructive pulmonary disease, unspecified: Secondary | ICD-10-CM

## 2014-11-11 DIAGNOSIS — J432 Centrilobular emphysema: Secondary | ICD-10-CM

## 2014-11-11 DIAGNOSIS — I482 Chronic atrial fibrillation, unspecified: Secondary | ICD-10-CM

## 2014-11-11 NOTE — Progress Notes (Signed)
Patient ID: [name], female    DOB: [birth year], 80 y.o.   MRN: [id number]  HPI 08/02/10- 89 yoF former smoker followed for dyspnea, COPD,  Complicated by hypothyroid, depression, HBP. Last here January 31, 2010- note reviewed. Here with son. Just out of hospital for hyponatremia and bronchitis. We discussed early return to her PCP Dr Mellody Drown. She was fine coming out of the hospital, while on prednisone. Son thinks being off prednisone is associated with her sense of fatigue.  Nebs helped in hospital- we compared her current meds.  Discussed her slow heart rate- she has appt in 2 days with Dr Terrence Dupont.  Denies dizzy, syncope.  11/07/10-  81 yoF former smoker followed for dyspnea, COPD,  Complicated by hypothyroid, depression, HBP, Hx PAfib. Stressed- daughter died in 2022/09/28- aged 77- bowel obstruction. Last visit here she was sent to hosp for 2 days w/ AFib.  Breathing usually ok in [facility], but easily dyspneic outdoors. Daily nap. Denies daily cough- scant clear. Denies chest pain, palpitation.  She quit smoking in 2006. Pulmonary rehab did help, but walking very little since.  Declines flu vax- will get from Dr Mellody Drown.  CXR-07/09/2010 hyperinflation but no active disease. Atherosclerosis noted in the aorta.  05/07/11- 80 yoF former smoker followed for dyspnea, COPD,  Complicated by hypothyroid, depression, HBP. Good winter with no significant respiratory infections. Denies routine cough or wheeze. She has done pulmonary rehabilitation in the past but is not regularly exercising now. She cleans her house one small project at a time and paces herself generally. We expressed concern about her blood pressure and she says it runs around 026 systolic at home. PFT-05/07/2011-severe obstructive airways disease with insignificant response to bronchodilator, air trapping. Diffusion is moderately reduced. FEV1 0.76/42%, FEV1/FVC 0.41.  11/06/11- 76 yoF former smoker followed for dyspnea, COPD,  Complicated by  hypothyroid, depression, HBP. SOB same,worse w/humid.no cough or wheeze ER visit July 6 for hypertension. Since then has felt more stable. Breathing has been controlled with no cough and she does better with Advair. Occasional minor hoarseness. She had tried returning to pulmonary rehabilitation but was stopped by her hypertension. History of atrial fibrillation on amiodarone. Gets flu shot at primary office COPD assessment test (CAT) score 9/40  11/05/12- 76 yoF former smoker followed for dyspnea, COPD,  Complicated by hypothyroid, depression, HBP, AFib/ Xarelto/ L body CVA-resolved. FOLLOWS [id number] is unchanged since last OV. Pt states is slightly SOB  but also feeling very anxious today. Denies cough or wheeze. Doing well with current medications. Paces herself during exertion. Finished cardiac rehabilitation. 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 Resolved. CXR 03/25/12 IMPRESSION:  No evidence of acute cardiopulmonary disease.  Cardiomegaly and COPD/emphysema.  Original Report Authenticated By: Margarette Canada, M.D.  11/06/13- 74 yoF former smoker followed for dyspnea, COPD,  Complicated by hypothyroid, depression, HBP, AFib/ Xarelto/ L body CVA-resolved. FOLLOWS FOR: pt c/o occasional SOB with exertion.  Denies cough, congestion.    CXR 05/14/13 IMPRESSION:  Emphysema. No active lung disease.  Electronically Signed  By: Ivar Drape M.D.  On: 05/14/2013 13:00  11/11/14- 79 yoF former smoker followed for dyspnea, COPD,  Complicated by hypothyroid, depression, HBP, AFib/ Xarelto/ L body CVA-resolved Follows For: pt states shes doing well. pt c/o SOB when walks down her driveway but its not unmanagable. pt has no other concerns,  Rarely catches colds. Paces herself. Little wheeze or cough. Gets flu vaccine from her primary physician. Completed pulmonary rehabilitation.  Review  of Systems-see HPI Constitutional:   No weight loss, night sweats,  Fevers, chills,  fatigue, lassitude. HEENT:   No headaches,  Difficulty swallowing,  Tooth/dental problems,  Sore throat,                No sneezing, itching, ear ache, nasal congestion, post nasal drip,  CV:  No chest pain,  Orthopnea, PND, swelling in lower extremities, anasarca, dizziness, palpitations GI  No heartburn, indigestion, abdominal pain, nausea, vomiting,  Resp: As per HPI.  No excess mucus, no productive cough,  No non-productive cough,  No coughing up of blood.  No change in color of mucus.  No wheezing.    Skin: no rash or lesions. GU:   MS:  No joint pain or swelling.  Psych:  No change in mood or affect. No depression or anxiety.  No memory loss.    Objective:   Physical Exam General- Alert, Oriented, Affect-appropriate, Distress- none acute Skin- rash-none, lesions- none, excoriation- none Lymphadenopathy- none Head- atraumatic            Eyes- Gross vision intact, PERRLA, conjunctivae clear secretions            Ears- Hearing, canals            Nose- Clear, Septal dev, mucus, polyps, erosion, perforation             Throat- Mallampati II , mucosa clear , drainage- none, tonsils- atrophic Neck- flexible , trachea midline, no stridor , thyroid nl, carotid no bruit Chest - symmetrical excursion , unlabored           Heart/CV- RRR occasional extra beat , + trace AS murmur , no gallop  , no rub, nl s1 s2                           - JVD- none , edema- none, stasis changes- none, varices- none           Lung- +clear/ distant, unlabored , wheeze- none, cough- none , dullness-none, rub- none           Chest wall-  Abd- Br/ Gen/ Rectal- Not done, not indicated Extrem- cyanosis- none, clubbing, none, atrophy- none, strength- nl Neuro- grossly intact to observation

## 2014-11-11 NOTE — Patient Instructions (Signed)
Please don't forget that flu shot later this fall  Order- CXR    Dx COPD mixed type

## 2014-11-12 NOTE — Progress Notes (Signed)
Quick Note:  Called and spoke with pt. Reviewed results and recs. Pt voiced understanding and had no further questions. ______ 

## 2014-11-14 NOTE — Assessment & Plan Note (Signed)
Controlled ventricular response rate. Continues Xarelto

## 2014-11-14 NOTE — Assessment & Plan Note (Signed)
Well-controlled without recent exacerbation She will ask her primary physician if she has had Prevnar 13 pneumonia vaccine

## 2014-12-15 ENCOUNTER — Other Ambulatory Visit: Payer: Self-pay

## 2014-12-15 DIAGNOSIS — Z1231 Encounter for screening mammogram for malignant neoplasm of breast: Secondary | ICD-10-CM

## 2015-01-12 ENCOUNTER — Ambulatory Visit: Payer: Medicare Other

## 2015-01-12 ENCOUNTER — Other Ambulatory Visit: Payer: Self-pay | Admitting: Internal Medicine

## 2015-01-12 DIAGNOSIS — N63 Unspecified lump in unspecified breast: Secondary | ICD-10-CM

## 2015-01-13 ENCOUNTER — Other Ambulatory Visit: Payer: Medicare Other

## 2015-01-24 ENCOUNTER — Ambulatory Visit
Admission: RE | Admit: 2015-01-24 | Discharge: 2015-01-24 | Disposition: A | Discharge status: Home / Self Care | Payer: Medicare Other | Source: Ambulatory Visit | Attending: Internal Medicine | Admitting: Internal Medicine

## 2015-01-24 ENCOUNTER — Other Ambulatory Visit: Payer: Self-pay | Admitting: Internal Medicine

## 2015-01-24 DIAGNOSIS — R2232 Localized swelling, mass and lump, left upper limb: Secondary | ICD-10-CM

## 2015-01-24 DIAGNOSIS — N63 Unspecified lump in unspecified breast: Secondary | ICD-10-CM

## 2015-01-24 DIAGNOSIS — N6320 Unspecified lump in the left breast, unspecified quadrant: Secondary | ICD-10-CM

## 2015-02-07 ENCOUNTER — Other Ambulatory Visit: Payer: Self-pay | Admitting: Internal Medicine

## 2015-02-07 ENCOUNTER — Ambulatory Visit
Admission: RE | Admit: 2015-02-07 | Discharge: 2015-02-07 | Disposition: A | Discharge status: Home / Self Care | Payer: Medicare Other | Source: Ambulatory Visit | Attending: Internal Medicine | Admitting: Internal Medicine

## 2015-02-07 DIAGNOSIS — R2232 Localized swelling, mass and lump, left upper limb: Secondary | ICD-10-CM

## 2015-02-07 DIAGNOSIS — N6320 Unspecified lump in the left breast, unspecified quadrant: Secondary | ICD-10-CM

## 2015-02-09 ENCOUNTER — Encounter: Payer: Self-pay | Admitting: *Deleted

## 2015-02-09 ENCOUNTER — Telehealth: Payer: Self-pay | Admitting: *Deleted

## 2015-02-09 DIAGNOSIS — C50312 Malignant neoplasm of lower-inner quadrant of left female breast: Secondary | ICD-10-CM

## 2015-02-09 HISTORY — DX: Malignant neoplasm of lower-inner quadrant of left female breast: C50.312

## 2015-02-09 NOTE — Telephone Encounter (Signed)
Confirmed [facility] for 02/16/15 at 0830 .  Instructions and contact information given.

## 2015-02-10 ENCOUNTER — Telehealth: Payer: Self-pay | Admitting: *Deleted

## 2015-02-10 NOTE — Telephone Encounter (Signed)
Mailed clinic packet to pt.

## 2015-02-16 ENCOUNTER — Ambulatory Visit (HOSPITAL_BASED_OUTPATIENT_CLINIC_OR_DEPARTMENT_OTHER): Payer: Medicare Other | Admitting: Hematology and Oncology

## 2015-02-16 ENCOUNTER — Other Ambulatory Visit (HOSPITAL_BASED_OUTPATIENT_CLINIC_OR_DEPARTMENT_OTHER): Payer: Medicare Other

## 2015-02-16 ENCOUNTER — Encounter: Payer: Self-pay | Admitting: Hematology and Oncology

## 2015-02-16 ENCOUNTER — Ambulatory Visit: Payer: Medicare Other | Attending: Surgery | Admitting: Physical Therapy

## 2015-02-16 ENCOUNTER — Encounter: Payer: Self-pay | Admitting: Physical Therapy

## 2015-02-16 ENCOUNTER — Ambulatory Visit
Admission: RE | Admit: 2015-02-16 | Discharge: 2015-02-16 | Disposition: A | Discharge status: Home / Self Care | Payer: Medicare Other | Source: Ambulatory Visit | Attending: Radiation Oncology | Admitting: Radiation Oncology

## 2015-02-16 ENCOUNTER — Encounter: Payer: Self-pay | Admitting: *Deleted

## 2015-02-16 ENCOUNTER — Encounter: Payer: Self-pay | Admitting: Nurse Practitioner

## 2015-02-16 ENCOUNTER — Encounter: Payer: Self-pay | Admitting: Skilled Nursing Facility1

## 2015-02-16 VITALS — BP 160/69 | HR 63 | Temp 97.9°F | Resp 19 | Ht 62.0 in | Wt 153.9 lb

## 2015-02-16 DIAGNOSIS — C50312 Malignant neoplasm of lower-inner quadrant of left female breast: Secondary | ICD-10-CM

## 2015-02-16 DIAGNOSIS — R293 Abnormal posture: Secondary | ICD-10-CM | POA: Insufficient documentation

## 2015-02-16 DIAGNOSIS — Z9181 History of falling: Secondary | ICD-10-CM | POA: Insufficient documentation

## 2015-02-16 LAB — CBC WITH DIFFERENTIAL/PLATELET
BASO%: 0.5 % (ref 0.0–2.0)
BASOS ABS: 0.1 10*3/uL (ref 0.0–0.1)
EOS ABS: 0.1 10*3/uL (ref 0.0–0.5)
EOS%: 0.8 % (ref 0.0–7.0)
HEMATOCRIT: 41.4 % (ref 34.8–46.6)
HEMOGLOBIN: 13.1 g/dL (ref 11.6–15.9)
LYMPH#: 1.2 10*3/uL (ref 0.9–3.3)
LYMPH%: 10.6 % — ABNORMAL LOW (ref 14.0–49.7)
MCH: 31.3 pg (ref 25.1–34.0)
MCHC: 31.6 g/dL (ref 31.5–36.0)
MCV: 99 fL (ref 79.5–101.0)
MONO#: 1 10*3/uL — ABNORMAL HIGH (ref 0.1–0.9)
MONO%: 9 % (ref 0.0–14.0)
NEUT%: 79.1 % — ABNORMAL HIGH (ref 38.4–76.8)
NEUTROS ABS: 8.7 10*3/uL — AB (ref 1.5–6.5)
Platelets: 361 10*3/uL (ref 145–400)
RBC: 4.18 10*6/uL (ref 3.70–5.45)
RDW: 13.3 % (ref 11.2–14.5)
WBC: 11 10*3/uL — AB (ref 3.9–10.3)

## 2015-02-16 LAB — COMPREHENSIVE METABOLIC PANEL
ALBUMIN: 3.6 g/dL (ref 3.5–5.0)
ALT: 15 U/L (ref 0–55)
AST: 17 U/L (ref 5–34)
Alkaline Phosphatase: 62 U/L (ref 40–150)
Anion Gap: 11 mEq/L (ref 3–11)
BUN: 20.3 mg/dL (ref 7.0–26.0)
CALCIUM: 9.5 mg/dL (ref 8.4–10.4)
CO2: 24 mEq/L (ref 22–29)
CREATININE: 1 mg/dL (ref 0.6–1.1)
Chloride: 103 mEq/L (ref 98–109)
EGFR: 54 mL/min/{1.73_m2} — ABNORMAL LOW (ref >90)
GLUCOSE: 126 mg/dL (ref 70–140)
POTASSIUM: 3.9 meq/L (ref 3.5–5.1)
SODIUM: 137 meq/L (ref 136–145)
TOTAL PROTEIN: 7.8 g/dL (ref 6.4–8.3)
Total Bilirubin: 0.32 mg/dL (ref 0.20–1.20)

## 2015-02-16 NOTE — Progress Notes (Signed)
[name] is a very pleasant 80 y.o. 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  Biopsy results revealed the tumor's prognostic profile is ER positive, PR positive, and HER2/neu negative. Ki67 is 15%.  She presents today with her sons to the [facility]) for treatment consideration and recommendations from the breast surgeon, radiation oncologist, and medical oncologist.     I briefly met with [name] and her sons during her [facility] visit today. We discussed the purpose of the Survivorship Clinic, which will include monitoring for recurrence, coordinating completion of age and gender-appropriate cancer screenings, promotion of overall wellness, as well as managing potential late/Dearmas-term side effects of anti-cancer treatments.    The treatment plan for [name] will likely include surgery, radiation therapy, and anti-estrogen therapy.  As of today, the intent of treatment for [name] is cure, therefore she will be eligible for the Survivorship Clinic upon her completion of treatment.  Her survivorship care plan (SCP) document will be drafted and updated throughout the course of her treatment trajectory. She will receive the SCP in an office visit with myself in the Survivorship Clinic once she has completed treatment.   [name] was encouraged to ask questions and all questions were answered to her satisfaction.  She was given my business card and encouraged to contact me with any concerns regarding survivorship.  I look forward to participating in her care.   Kenn File, [place] [phone]

## 2015-02-16 NOTE — Progress Notes (Signed)
Radiation Oncology         [phone] ________________________________  Name: [name] MRN: [id number]  Date: 02/16/2015  DOB: 08/18/1934     ZT:[id number],IPJ, MD  Alphonsa Overall, MD     REFERRING PHYSICIAN: Alphonsa Overall, MD  DIAGNOSIS: The encounter diagnosis was Breast cancer of lower-inner quadrant of left female breast [facility]).  HISTORY OF PRESENT ILLNESS::[name] is a 80 y.o. female who is seen for an initial consultation visit regarding the patient's diagnosis of breast cancer.  The patient was found to have suspicious findings within the left breast on initial mammogram. The patient has had symptoms prior to this study: palpable mass for 2-3 weeks, skipped one years of mammograms. A diagnostic mammogram and breast ultrasound confirmed this finding. On ultrasound, the tumor measured 1.7 cm and was present in the lower inner quadrant.  A biopsy was performed. This revealed grade II/III IDC with DCIS . Receptors studies were completed and indicate that the tumor is estrogen receptor positive, progesterone receptor positive, and Her-2/neu negative. The Ki-67 staining was 15%. Multiple enlarged axillary lymph nodes, 1/1 lymph node positive. Extracapsular extension present.   The patient has not undergone an MRI scan of the breasts: an MRI will be ordered before surgery.  Patient sees Dr. Keturah Barre for pulmonology.   PREVIOUS RADIATION THERAPY: No   PAST MEDICAL HISTORY:  has a past medical history of Hypertension; Allergy history unknown; Sinus problem; Arthritis; IBS (irritable bowel syndrome); Fibromyalgia; COPD, severe; Depression; Hyperlipidemia; Osteoarthritis; Fibromyalgia; COPD (chronic obstructive pulmonary disease) ([place]); A-fib ([place]); Breast cancer of lower-inner quadrant of left female breast ([place]) (02/09/2015); Stroke [facility]); and Thyroid disease.     PAST SURGICAL HISTORY: Past Surgical History  Procedure Laterality Date  . Tubal ligation    . Appendectomy    .  Tonsillectomy    . Hemorrhoid surgery    . Cholecystectomy       FAMILY HISTORY: family history is not on file. She was adopted.   SOCIAL HISTORY:  reports that she quit smoking about 10 years ago. She has never used smokeless tobacco. She reports that she does not drink alcohol or use illicit drugs.   ALLERGIES: Codeine and Penicillins   MEDICATIONS:  Current Outpatient Prescriptions  Medication Sig Dispense Refill  . acetaminophen (TYLENOL) 325 MG tablet Take 650 mg by mouth every 6 (six) hours as needed. For pain/fever    . albuterol (PROAIR HFA) 108 (90 BASE) MCG/ACT inhaler Inhale 2 puffs into the lungs every 4 (four) hours as needed. For shortness of breath    . amiodarone (PACERONE) 200 MG tablet Take 100 mg by mouth daily.     [name] amLODipine (NORVASC) 2.5 MG tablet Take 2.5 mg by mouth daily.    [name] atorvastatin (LIPITOR) 10 MG tablet   2  . calcium carbonate (OS-CAL) 600 MG TABS tablet Take 600 mg by mouth daily with breakfast.    . furosemide (LASIX) 40 MG tablet Take 40 mg by mouth daily.     [name] LORazepam (ATIVAN) 0.5 MG tablet Take 0.5 mg by mouth 2 (two) times daily as needed.      [name] losartan (COZAAR) 100 MG tablet Take 100 mg by mouth daily.    . magnesium oxide (MAG-OX) 400 MG tablet Take 400 mg by mouth 2 (two) times daily.      . mometasone-formoterol (DULERA) 100-5 MCG/ACT AERO Inhale 2 puffs into the lungs 2 (two) times daily. 1 Inhaler 6  . Multiple Vitamin (MULTIVITAMIN)  tablet Take 1 tablet by mouth daily.    . Rivaroxaban (XARELTO) 20 MG TABS Take 1 tablet (20 mg total) by mouth daily with supper. 30 tablet 2  . spironolactone (ALDACTONE) 25 MG tablet   2  . SYNTHROID 150 MCG tablet Take 137 mcg by mouth daily.     [name] tiotropium (SPIRIVA) 18 MCG inhalation capsule Place 1 capsule (18 mcg total) into inhaler and inhale daily. 10 capsule 0  . vitamin B-12 (CYANOCOBALAMIN) 1000 MCG tablet Take 1,000 mcg by mouth daily.    . [DISCONTINUED] digoxin (LANOXIN) 0.125 MG  tablet Take 1 tablet by mouth Daily.     No current facility-administered medications for this encounter.     REVIEW OF SYSTEMS:  A 15 point review of systems is documented in the electronic medical record. This was obtained by the nursing staff. However, I reviewed this with the patient to discuss relevant findings and make appropriate changes.  Pertinent items are noted in HPI.    PHYSICAL EXAM:  Vitals with Age-Percentiles 02/16/2015  Length 790.2 cm  Systolic 409  Diastolic 69  MAP   Pulse 63  Respiration 19  Weight 69.809 kg  BMI 28.2    ECOG = 1  General: Well-developed, in no acute distress, accompanied by 2 sons on evaluation today HEENT: Normocephalic, atraumatic; oral cavity clear Neck: Supple without any lymphadenopathy, no palpable axillary adenopathy Cardiovascular: Regular rate and rhythm Respiratory: Clear to auscultation bilaterally Breasts: Left breast with bruising medially, questionable mass in the 3 o'clock position. Right breast no masses palpable. No nipple discharge or bleeding. GI: Soft, nontender, normal bowel sounds Extremities: No edema present Neuro: No focal deficits  LABORATORY DATA:  Lab Results  Component Value Date   WBC 11.0* 02/16/2015   HGB 13.1 02/16/2015   HCT 41.4 02/16/2015   MCV 99.0 02/16/2015   PLT 361 02/16/2015   Lab Results  Component Value Date   NA 137 02/16/2015   K 3.9 02/16/2015   CL 98 03/25/2012   CO2 24 02/16/2015   Lab Results  Component Value Date   ALT 15 02/16/2015   AST 17 02/16/2015   ALKPHOS 62 02/16/2015   BILITOT 0.32 02/16/2015     RADIOGRAPHY: Mm Digital Diagnostic Unilat L  02/07/2015  CLINICAL DATA:  Patient status post ultrasound-guided core needle biopsy left breast mass, 8 o'clock position EXAM: DIAGNOSTIC LEFT MAMMOGRAM POST ULTRASOUND BIOPSY COMPARISON:  Previous exam(s). FINDINGS: Mammographic images were obtained following ultrasound guided biopsy of left breast mass, 8 o'clock  position. Biopsy marking clip (ribbon shaped) in appropriate position. IMPRESSION: Appropriate position ribbon shaped marking clip status post ultrasound-guided core needle biopsy left breast mass, 8 o'clock position. Final Assessment: Post Procedure Mammograms for Marker Placement Electronically Signed   By: Lovey Newcomer M.D.   On: 02/07/2015 12:02   US Breast Ltd Uni Left Inc Axilla  01/24/2015  CLINICAL DATA:  80 year old with a palpable lump in the lower inner left breast which she initially noticed approximately 2-3 weeks ago. Annual evaluation, right breast. Prior benign excisional biopsy of the left breast in 1985. EXAM: DIGITAL DIAGNOSTIC BILATERAL MAMMOGRAM WITH 3D TOMOSYNTHESIS WITH CAD ULTRASOUND LEFT BREAST COMPARISON:  Mammography 12/11/2012 and earlier. Left breast ultrasound in 2008 was performed in a different part of the breast. ACR Breast Density Category c: The breast tissue is heterogeneously dense, which may obscure small masses. FINDINGS: CC and MLO views of both breasts were obtained with tomosynthesis and a spot tangential view of the  area of palpable concern in the left breast was also obtained. There is focal skin thickening in the area of palpable concern in the lower inner left breast. On the CC tomosynthesis image, there is architectural distortion in the lower inner left breast underlying the area of palpable concern, less well seen on the tomosynthesis MLO view. An oval mass with vague margins is present on the spot tangential view. No suspicious findings elsewhere in the left breast. No findings suspicious for malignancy in the right breast. Mammographic images were processed with CAD. On physical exam, there is an approximate 2 cm palpable lump in the lower inner left breast corresponding to what the patient is feeling. Targeted left breast ultrasound is performed, showing a vertically oriented hypoechoic mass with irregular margins and dense acoustic shadowing at the 8:30 o'clock  position approximately 4 cm from the nipple measuring approximately 1.1 x 1.7 x 0.9 cm, corresponding to the area of palpable concern and the mammographic abnormality. There is peripheral color Doppler flow. Sonographic evaluation of the left axilla demonstrates at least 2 pathologic left axillary lymph nodes, each demonstrating cortical thickening. A lymph node in the low mid axillary line measures approximately 1.3 cm, with a cortical thickness of approximately 9 mm. A lymph node in the low posterior axillary line has cortical thickening measuring approximately 9 mm involving over half of the node, and has maintained its fatty hilus. IMPRESSION: 1. Suspicious approximate 1.7 cm mass in the lower inner quadrant of the left breast accounting for the area of palpable concern. 2. At least 2 pathologic left axillary lymph nodes demonstrating cortical thickening. 3. No mammographic evidence of malignancy, right breast. RECOMMENDATION: Ultrasound-guided core needle biopsy of the suspicious mass in the lower inner left breast and of a pathologic left axillary lymph node. The core needle biopsy procedure was discussed with the patient and her questions were answered. She has agreed to proceed. As the patient is currently anticoagulated with Xarelto, she will be scheduled for the biopsy after the referring physicians office has been notified. I have discussed the findings and recommendations with the patient. Results were also provided in writing at the conclusion of the visit. BI-RADS CATEGORY  4: Suspicious. Electronically Signed   By: Evangeline Dakin M.D.   On: 01/24/2015 16:02   Mm Diag Breast Tomo Bilateral  01/24/2015  CLINICAL DATA:  80 year old with a palpable lump in the lower inner left breast which she initially noticed approximately 2-3 weeks ago. Annual evaluation, right breast. Prior benign excisional biopsy of the left breast in 1985. EXAM: DIGITAL DIAGNOSTIC BILATERAL MAMMOGRAM WITH 3D TOMOSYNTHESIS  WITH CAD ULTRASOUND LEFT BREAST COMPARISON:  Mammography 12/11/2012 and earlier. Left breast ultrasound in 2008 was performed in a different part of the breast. ACR Breast Density Category c: The breast tissue is heterogeneously dense, which may obscure small masses. FINDINGS: CC and MLO views of both breasts were obtained with tomosynthesis and a spot tangential view of the area of palpable concern in the left breast was also obtained. There is focal skin thickening in the area of palpable concern in the lower inner left breast. On the CC tomosynthesis image, there is architectural distortion in the lower inner left breast underlying the area of palpable concern, less well seen on the tomosynthesis MLO view. An oval mass with vague margins is present on the spot tangential view. No suspicious findings elsewhere in the left breast. No findings suspicious for malignancy in the right breast. Mammographic images were processed with CAD. On  physical exam, there is an approximate 2 cm palpable lump in the lower inner left breast corresponding to what the patient is feeling. Targeted left breast ultrasound is performed, showing a vertically oriented hypoechoic mass with irregular margins and dense acoustic shadowing at the 8:30 o'clock position approximately 4 cm from the nipple measuring approximately 1.1 x 1.7 x 0.9 cm, corresponding to the area of palpable concern and the mammographic abnormality. There is peripheral color Doppler flow. Sonographic evaluation of the left axilla demonstrates at least 2 pathologic left axillary lymph nodes, each demonstrating cortical thickening. A lymph node in the low mid axillary line measures approximately 1.3 cm, with a cortical thickness of approximately 9 mm. A lymph node in the low posterior axillary line has cortical thickening measuring approximately 9 mm involving over half of the node, and has maintained its fatty hilus. IMPRESSION: 1. Suspicious approximate 1.7 cm mass in  the lower inner quadrant of the left breast accounting for the area of palpable concern. 2. At least 2 pathologic left axillary lymph nodes demonstrating cortical thickening. 3. No mammographic evidence of malignancy, right breast. RECOMMENDATION: Ultrasound-guided core needle biopsy of the suspicious mass in the lower inner left breast and of a pathologic left axillary lymph node. The core needle biopsy procedure was discussed with the patient and her questions were answered. She has agreed to proceed. As the patient is currently anticoagulated with Xarelto, she will be scheduled for the biopsy after the referring physicians office has been notified. I have discussed the findings and recommendations with the patient. Results were also provided in writing at the conclusion of the visit. BI-RADS CATEGORY  4: Suspicious. Electronically Signed   By: Evangeline Dakin M.D.   On: 01/24/2015 16:02   [place] Lt Breast Bx W Loc Dev 1st Lesion Img Bx Spec US Guide  02/10/2015  ADDENDUM REPORT: 02/09/2015 09:10 ADDENDUM: Pathology revealed grade II to III invasive ductal carcinoma and ductal carcinoma in situ in the left breast and a node positive for metastatic carcinoma in the left axilla. This was found to be concordant by Dr. Lovey Newcomer. Pathology was discussed with the patient by telephone. She reported doing well after the biopsy with bruising at the site. Post biopsy instructions and care were reviewed and her questions were answered. The patient has been [facility] at [facility] on February [facility] 2015. She was encouraged to come to [facility] for educational materials. My number was provided for additional questions and concerns. Pathology results reported by Susa Raring RN, BSN on February 09, 2015. Electronically Signed   By: Lovey Newcomer M.D.   On: 02/09/2015 09:10  02/10/2015  CLINICAL DATA:  Patient with suspicious left breast mass, 8 o'clock  position. EXAM: ULTRASOUND GUIDED LEFT BREAST CORE NEEDLE BIOPSY COMPARISON:  Previous exam(s). FINDINGS: I met with the patient and we discussed the procedure of ultrasound-guided biopsy, including benefits and alternatives. We discussed the high likelihood of a successful procedure. We discussed the risks of the procedure, including infection, bleeding, tissue injury, clip migration, and inadequate sampling. Informed written consent was given. The usual time-out protocol was performed immediately prior to the procedure. Using sterile technique and 2% Lidocaine as local anesthetic, under direct ultrasound visualization, a 14 gauge spring-loaded device was used to perform biopsy of left breast mass 8 o'clock position using a medial approach. At the conclusion of the procedure a ribbon shaped tissue marker clip was deployed into the biopsy cavity. Follow up  2 view mammogram was performed and dictated separately. IMPRESSION: Ultrasound guided biopsy of left breast mass, 8 o'clock position. No apparent complications. Electronically Signed: By: Lovey Newcomer M.D. On: 02/07/2015 12:00   [place] Lt Breast Bx W Loc Dev Ea Add Lesion Img Bx Spec US Guide  02/07/2015  CLINICAL DATA:  Patient with enlarged left axillary lymph node. EXAM: ULTRASOUND GUIDED CORE NEEDLE BIOPSY OF A LEFT AXILLARY NODE COMPARISON:  Previous exam(s). FINDINGS: I met with the patient and we discussed the procedure of ultrasound-guided biopsy, including benefits and alternatives. We discussed the high likelihood of a successful procedure. We discussed the risks of the procedure, including infection, bleeding, tissue injury, clip migration, and inadequate sampling. Informed written consent was given. The usual time-out protocol was performed immediately prior to the procedure. Using sterile technique and 2% Lidocaine as local anesthetic, under direct ultrasound visualization, a 14 gauge spring-loaded device was used to perform biopsy of left axillary  lymph node using a lateral approach. At the conclusion of the procedure a HydroMARK tissue marker clip was deployed into the biopsy cavity. IMPRESSION: Ultrasound guided biopsy of left axillary lymph node. No apparent complications. Electronically Signed   By: Lovey Newcomer M.D.   On: 02/07/2015 12:01    IMPRESSION: Clinical stage: T1cN1 (stage II a)   Breast cancer of lower-inner quadrant of left female breast ([place])   01/24/2015 Mammogram Area of palpable concern left breast lower inner quadrant focal skin thickening with architectural distortion, ultrasound showed 8:30: 1.1 x 1.7 x 0.9 cm abnormality, left axilla to pathologic axillary lymph nodes cortical thickening 1.3 cm, 9 mm   02/07/2015 Initial Diagnosis Left breast biopsy 8:00: IDC with DCIS grade 2-3, ER 95%, PR 60%, Ki-67 15%, HER-2 negative ratio 1.31; left axillary lymph node biopsy positive for metastatic carcinoma with extracapsular extension   The patient has a recent diagnosis of grade II/III IDC with DCIS of the left breast. She appears to be a good candidate for breast conservation treatment.  I discussed with the patient the role of adjuvant radiation treatment in this setting. We discussed the potential benefit of radiation treatment, especially with regards to local control of the patient's tumor. We also discussed the possible side effects and risks of such a treatment as well.  All of the patient's questions were answered. The patient wishes to proceed with radiation treatment at the appropriate time.  PLAN: Breast conserving surgery and axillary lymph node dissection since she has biopsy proven lymph nodes, which appeared to be multiple on imaging studies.   I look forward to seeing the patient postoperatively to review her case and further discuss and coordinate an anticipated course of radiation treatment.   For surgery, lumpectomy and axillary node disection is planned.  Patient is not a good candidate for systemic  chemotherapy given her age and chronic health problems.    -----------------------------------  Blair Promise, PhD, MD     This document serves as a record of services personally performed by Gery Pray, MD. It was created on his behalf by Derek Mound, a trained medical scribe. The creation of this record is based on the scribe's personal observations and the provider's statements to them. This document has been checked and approved by the attending provider.

## 2015-02-16 NOTE — Assessment & Plan Note (Signed)
Left breast biopsy 8:00: IDC with DCIS grade 2-3, ER 95%, PR 60%, Ki-67 15%, HER-2 negative ratio 1.31; left axillary lymph node biopsy positive for metastatic carcinoma with extracapsular extension. Area of palpable concern left breast lower inner quadrant focal skin thickening with architectural distortion, ultrasound showed 8:30: 1.1 x 1.7 x 0.9 cm abnormality, left axilla to pathologic axillary lymph nodes cortical thickening 1.3 cm, 9 mm Clinical stage: T1cN1 (stage II a)  Pathology and radiology review: Discussed with the patient, the details of pathology including the type of breast cancer,the clinical staging, the significance of ER, PR and HER-2/neu receptors and the implications for treatment. After reviewing the pathology in detail, we proceeded to discuss the different treatment options between surgery, radiation, chemotherapy, antiestrogen therapies.  Recommendation: 1. Breast conserving surgery +/- axillary lymph node dissection vs SLN 2. Mammaprint testing to determine chemotherapy benefit 3. Followed by adjuvant radiation 4. Followed by antiestrogen therapy  Return to clinic after surgery to discuss adjuvant treatment plan.

## 2015-02-16 NOTE — Progress Notes (Signed)
Subjective:     Patient ID: [name], female   DOB: [birth year], 80 y.o.   MRN: [id number]  HPI   Review of Systems     Objective:   Physical Exam For the patient to understand and be given the tools to implement a healthy plant based diet during their cancer diagnosis.     Assessment:     Patient was seen today and found to be in good spirits and accompanied y her sons. Pts ht 5'2'', 153 pounds, BMI 28.2. Pts labs: GFR 54. PTs medications: lipitor, multivitamin, synthroid, vitamin B12. Pt states she likes her sweets and does not want to try tofu. [name] is spirited.      Plan:     Dietitian educated the patient on implementing a plant based diet by incorporating more plant proteins, fruits, and vegetables. As a part of a healthy routine physical activity was discussed. The importance of legitimate, evidence based information was discussed and examples were given. A folder of evidence based information with a focus on a plant based diet and general nutrition during cancer was given to the patient.  As a part of the continuum of care the cancer dietitian's contact information was given to the patient in the event they would like to have a follow up appointment.

## 2015-02-16 NOTE — Progress Notes (Signed)
Clinical Social Work [place] Psychosocial Distress Screening [place]  Patient completed distress screening protocol and scored a 5 on the Psychosocial Distress Thermometer which indicates moderate distress. Clinical Social Worker met with patient and patients family in [facility] to assess for distress and other psychosocial needs. Patient stated she was "better" after meeting with the treatment team and getting more information on her treatment plan. CSW and patient discussed common feeling and emotions when being diagnosed with cancer, and the importance of support during treatment. CSW informed patient of the support team and support services at [facility]. CSW provided contact information and encouraged patient to call with any questions or concerns.   ONCBCN DISTRESS SCREENING 02/16/2015  Screening Type Initial Screening  Distress experienced in past week (1-10) 5  Emotional problem type Depression;Nervousness/Anxiety;Isolation/feeling alone  Spiritual/Religous concerns type Facing my mortality  Information Concerns Type Lack of info about diagnosis;Lack of info about treatment;Lack of info about complementary therapy choices;Lack of info about maintaining fitness  Physical Problem type Breathing  Physician notified of physical symptoms Yes  Referral to clinical psychology No  Referral to clinical social work Yes  Referral to dietition No  Referral to financial advocate No  Referral to support programs No  Referral to palliative care No   Johnnye Lana, MSW, LCSW, OSW-C Clinical Social Worker [place] [phone]

## 2015-02-16 NOTE — Progress Notes (Signed)
Note created by Dr. Gudena during office visit, copy to patient,original to scan. 

## 2015-02-16 NOTE — Therapy (Signed)
[place], [place], [address] Phone: [phone]   Fax:  [phone]  Physical Therapy Evaluation  Patient Details  Name: [name] MRN: [id number] Date of Birth: 1934/05/08 Referring Provider: Dr. Alphonsa Overall  Encounter Date: 02/16/2015      PT End of Session - 02/16/15 1412    Visit Number 1   Number of Visits 1   PT Start Time 5027   PT Stop Time 7412  Also saw pt from 1110-1125 for a total of 28 minutes   PT Time Calculation (min) 13 min   Activity Tolerance Patient tolerated treatment well   Behavior During Therapy [facility] for tasks assessed/performed      Past Medical History  Diagnosis Date  . Hypertension   . Allergy history unknown   . Sinus problem   . Arthritis   . IBS (irritable bowel syndrome)   . Fibromyalgia   . COPD, severe   . Depression   . Hyperlipidemia   . Osteoarthritis   . Fibromyalgia   . COPD (chronic obstructive pulmonary disease) ([place])   . A-fib ([place])   . Breast cancer of lower-inner quadrant of left female breast ([place]) 02/09/2015  . Stroke ([place])   . Thyroid disease     Past Surgical History  Procedure Laterality Date  . Tubal ligation    . Appendectomy    . Tonsillectomy    . Hemorrhoid surgery    . Cholecystectomy      There were no vitals filed for this visit.  Visit Diagnosis:  Cancer of lower-inner quadrant of left female breast [facility]) - Plan: PT plan of care cert/re-cert  At risk for falls - Plan: PT plan of care cert/re-cert  Abnormal posture - Plan: PT plan of care cert/re-cert      Subjective Assessment - 02/16/15 1404    Subjective Patient was seen today for a baseline assessment of her newly diagnosed left breast cancer.   Patient is accompained by: Family member   Pertinent History Patient was diagnosed on 01/24/15 with left grade 3 invasive ductal carcinoma.  It measures 1.7 cm and is located in the lower inner quadrant.  It is ER/PR  positive and HER2 negative with a Ki67 of 15%.  An axillary lymph node was biopsied and was positive.   Patient Stated Goals Reduce lymphedema risk and learn post op shoulder ROM HEP   Currently in Pain? No/denies            [facility] PT Assessment - 02/16/15 0001    Assessment   Medical Diagnosis Left breast cancer   Referring Provider Dr. Alphonsa Overall   Onset Date/Surgical Date 01/24/15   Hand Dominance Right   Prior Therapy none   Precautions   Precautions Fall;Other (comment)  Active breast cancer   Restrictions   Weight Bearing Restrictions No   Balance Screen   Has the patient fallen in the past 6 months Yes   How many times? 2   Has the patient had a decrease in activity level because of a fear of falling?  Yes   Is the patient reluctant to leave their home because of a fear of falling?  Yes  Pt c/o dizziness and will consider PT after sx for balance   [place] residence   Living Arrangements Alone   Available Help at Discharge Family   Prior Function   Level of [employer] Retired  Leisure She does not exercise   Cognition   Overall Cognitive Status Within Functional Limits for tasks assessed   Posture/Postural Control   Posture/Postural Control Postural limitations   Postural Limitations Forward head;Rounded Shoulders   ROM / Strength   AROM / PROM / Strength AROM;Strength   AROM   AROM Assessment Site Shoulder   Right/Left Shoulder Right;Left   Right Shoulder Extension 39 Degrees   Right Shoulder Flexion 145 Degrees   Right Shoulder ABduction 145 Degrees   Right Shoulder Internal Rotation 75 Degrees   Right Shoulder External Rotation 78 Degrees   Left Shoulder Extension 52 Degrees   Left Shoulder Flexion 152 Degrees   Left Shoulder ABduction 145 Degrees   Left Shoulder Internal Rotation 66 Degrees   Left Shoulder External Rotation 83 Degrees   Strength   Overall Strength Within functional  limits for tasks performed           LYMPHEDEMA/ONCOLOGY QUESTIONNAIRE - 02/16/15 1409    Type   Cancer Type Left breast cancer   Lymphedema Assessments   Lymphedema Assessments Upper extremities   Right Upper Extremity Lymphedema   10 cm Proximal to Olecranon Process 27.9 cm   Olecranon Process 23.2 cm   10 cm Proximal to Ulnar Styloid Process 21.3 cm   Just Proximal to Ulnar Styloid Process 14.3 cm   Across Hand at [employer] 17.6 cm   At [place] of 2nd Digit 5.9 cm   Left Upper Extremity Lymphedema   10 cm Proximal to Olecranon Process 27.3 cm   Olecranon Process 23.4 cm   10 cm Proximal to Ulnar Styloid Process 20.7 cm   Just Proximal to Ulnar Styloid Process 14.1 cm   Across Hand at [employer] 17.2 cm   At [employer] of 2nd Digit 6 cm      Patient was instructed today in a home exercise program today for post op shoulder range of motion. These included active assist shoulder flexion in sitting, scapular retraction, wall walking with shoulder abduction, and hands behind head external rotation.  She was encouraged to do these twice a day, holding 3 seconds and repeating 5 times when permitted by her physician.         PT Education - 02/16/15 1411    Education provided Yes   Education Details Lymphedema risk reduction and post op shoulder ROM HEP   Person(s) Educated Patient;Child(ren)   Methods Explanation;Demonstration;Handout   Comprehension Returned demonstration;Verbalized understanding              Breast Clinic Goals - 02/16/15 1416    Patient will be able to verbalize understanding of pertinent lymphedema risk reduction practices relevant to her diagnosis specifically related to skin care.   Time 1   Period Days   Status Achieved   Patient will be able to return demonstrate and/or verbalize understanding of the post-op home exercise program related to regaining shoulder range of motion.   Time 1   Period Days   Status Achieved   Patient will be  able to verbalize understanding of the importance of attending the postoperative After Breast Cancer Class for further lymphedema risk reduction education and therapeutic exercise.   Time 1   Period Days   Status Achieved              Plan - 02/16/15 1413    Clinical Impression Statement Patient was diagnosed on 01/24/15 with left grade 3 invasive ductal carcinoma.  It measures 1.7 cm and is  located in the lower inner quadrant.  It is ER/PR positive and HER2 negative with a Ki67 of 15%.  An axillary lymph node was biopsied and was positive.  She is planning to have a left lumpectomy and axillary node dissection with radiation and anti-estrogen therapy.  She will benefit from post op physical therapy for shoulder ROM, lymphedema risk reduction, and balance re-education.   Pt will benefit from skilled therapeutic intervention in order to improve on the following deficits Decreased knowledge of precautions;Pain;Decreased range of motion;Impaired UE functional use   Rehab Potential Good   Clinical Impairments Affecting Rehab Potential At risk for falls; previous CVA   PT Frequency One time visit   PT Treatment/Interventions Therapeutic exercise;Patient/family education   PT Next Visit Plan Reassess after surgery and implement PT for balance and shoulder ROM   Consulted and Agree with Plan of Care Patient;Family member/caregiver   Family Member Consulted 2 sons     Patient will follow up at outpatient cancer rehab if needed following surgery.  If the patient requires physical therapy at that time, a specific plan will be dictated and sent to the referring physician for approval. The patient was educated today on appropriate basic range of motion exercises to begin post operatively and the importance of attending the After Breast Cancer class following surgery.  Patient was educated today on lymphedema risk reduction practices as it pertains to recommendations that will benefit the patient  immediately following surgery.  She verbalized good understanding.  No additional physical therapy is indicated at this time.         G-Codes - 01-Mar-2015 1416    Functional Assessment Tool Used Clinical Judgement   Functional Limitation Other PT primary   Other PT Primary Current Status (W5809) At least 20 percent but less than 40 percent impaired, limited or restricted   Other PT Primary Goal Status (X8338) At least 20 percent but less than 40 percent impaired, limited or restricted   Other PT Primary Discharge Status (S5053) At least 20 percent but less than 40 percent impaired, limited or restricted       Problem List Patient Active Problem List   Diagnosis Date Noted  . Breast cancer of lower-inner quadrant of left female breast ([place]) 02/09/2015  . CVA (cerebrovascular accident) ([place]) 03/25/2012  . A-fib ([place]) 03/25/2012  . Bradycardia 08/02/2010  . DYSPNEA 12/01/2009  . HYPOTHYROIDISM 11/01/2009  . HYPERLIPIDEMIA 11/01/2009  . DEPRESSION 11/01/2009  . COPD with emphysema ([place]) 11/01/2009  . OSTEOARTHRITIS 11/01/2009  . HYPERTENSION 10/27/2009    Annia Friendly, PT Mar 01, 2015 2:19 PM  [place], [place], [address] Phone: [phone]   Fax:  [phone]  Name: HARRIETTA INCORVAIA MRN: [id number] Date of Birth: 09/30/1934

## 2015-02-16 NOTE — Patient Instructions (Signed)

## 2015-02-16 NOTE — Progress Notes (Signed)
[place] NOTE  Patient Care Team: [name], MD as PCP - General (Internal Medicine) Alphonsa Overall, MD as Consulting Physician (General Surgery) Nicholas Lose, MD as Consulting Physician (Hematology and Oncology) Gery Pray, MD as Consulting Physician (Radiation Oncology)  CHIEF COMPLAINTS/PURPOSE OF CONSULTATION:  Newly diagnosed breast cancer  HISTORY OF PRESENTING ILLNESS:  [name] 80 y.o. female is here because of recent diagnosis of left breast cancer. Patient had a previous left breast benign biopsy 1985. Currently she felt a lump in the left breast for the last 2-3 weeks. Mammogram and ultrasound was performed and she was found to have a 1.7 cm lesion at 8:30 position with enlarged multiple lymph nodes in the left axilla. The mass and the lymph nodes were biopsied. Pathology revealed a grade 3 invasive ductal carcinoma that was ER/PR positive HER-2 negative with a Ki-67 15%. The lymph node was also positive for metastatic cancer with extracapsular extension. She was presented at the multidisciplinary tumor board and she is here today at the [facility] clinic to discuss the treatment plan. She is accompanied by 2 of her sons.  I reviewed her records extensively and collaborated the history with the patient.  SUMMARY OF ONCOLOGIC HISTORY:   Breast cancer of lower-inner quadrant of left female breast ([place])   01/24/2015 Mammogram Area of palpable concern left breast lower inner quadrant focal skin thickening with architectural distortion, ultrasound showed 8:30: 1.1 x 1.7 x 0.9 cm abnormality, left axilla to pathologic axillary lymph nodes cortical thickening 1.3 cm, 9 mm   02/07/2015 Initial Diagnosis Left breast biopsy 8:00: IDC with DCIS grade 2-3, ER 95%, PR 60%, Ki-67 15%, HER-2 negative ratio 1.31; left axillary lymph node biopsy positive for metastatic carcinoma with extracapsular extension    In terms of breast cancer risk profile:  She menarched at early  age of 25 and went to menopause at age 56  She had 3 pregnancy, her first child was born at age 32  She has received birth control pills for approximately 40 years.  She was never exposed to fertility medications or hormone replacement therapy.  She has no family history of Breast/GYN/GI cancer  MEDICAL HISTORY:  Past Medical History  Diagnosis Date  . Hypertension   . Allergy history unknown   . Sinus problem   . Arthritis   . IBS (irritable bowel syndrome)   . Fibromyalgia   . COPD, severe   . Depression   . Hyperlipidemia   . Osteoarthritis   . Fibromyalgia   . COPD (chronic obstructive pulmonary disease) ([place])   . A-fib ([place])   . Breast cancer of lower-inner quadrant of left female breast ([place]) 02/09/2015  . Stroke ([place])   . Thyroid disease     SURGICAL HISTORY: Past Surgical History  Procedure Laterality Date  . Tubal ligation    . Appendectomy    . Tonsillectomy    . Hemorrhoid surgery    . Cholecystectomy      SOCIAL HISTORY: Social History   Social History  . Marital Status: Single    Spouse Name: N/A  . Number of Children: 3  . Years of Education: N/A   Occupational History  . retired    Social History Main Topics  . Smoking status: Former Smoker -- 0.50 packs/day    Quit date: 02/27/2004  . Smokeless tobacco: Never Used  . Alcohol Use: No  . Drug Use: No  . Sexual Activity: Not on file   Other Topics  Concern  . Not on file   Social History Narrative    FAMILY HISTORY: Family History  Problem Relation Age of Onset  . Adopted: Yes    ALLERGIES:  is allergic to codeine and penicillins.  MEDICATIONS:  Current Outpatient Prescriptions  Medication Sig Dispense Refill  . acetaminophen (TYLENOL) 325 MG tablet Take 650 mg by mouth every 6 (six) hours as needed. For pain/fever    . albuterol (PROAIR HFA) 108 (90 BASE) MCG/ACT inhaler Inhale 2 puffs into the lungs every 4 (four) hours as needed. For shortness of breath    . amiodarone  (PACERONE) 200 MG tablet Take 100 mg by mouth daily.     [name] amLODipine (NORVASC) 2.5 MG tablet Take 2.5 mg by mouth daily.    [name] atorvastatin (LIPITOR) 10 MG tablet   2  . calcium carbonate (OS-CAL) 600 MG TABS tablet Take 600 mg by mouth daily with breakfast.    . furosemide (LASIX) 40 MG tablet Take 40 mg by mouth daily.     [name] LORazepam (ATIVAN) 0.5 MG tablet Take 0.5 mg by mouth 2 (two) times daily as needed.      [name] losartan (COZAAR) 100 MG tablet Take 100 mg by mouth daily.    . magnesium oxide (MAG-OX) 400 MG tablet Take 400 mg by mouth 2 (two) times daily.      . mometasone-formoterol (DULERA) 100-5 MCG/ACT AERO Inhale 2 puffs into the lungs 2 (two) times daily. 1 Inhaler 6  . Multiple Vitamin (MULTIVITAMIN) tablet Take 1 tablet by mouth daily.    . Rivaroxaban (XARELTO) 20 MG TABS Take 1 tablet (20 mg total) by mouth daily with supper. 30 tablet 2  . SYNTHROID 150 MCG tablet Take 137 mcg by mouth daily.     [name] tiotropium (SPIRIVA) 18 MCG inhalation capsule Place 1 capsule (18 mcg total) into inhaler and inhale daily. 10 capsule 0  . vitamin B-12 (CYANOCOBALAMIN) 1000 MCG tablet Take 1,000 mcg by mouth daily.    [name] spironolactone (ALDACTONE) 25 MG tablet   2  . [DISCONTINUED] digoxin (LANOXIN) 0.125 MG tablet Take 1 tablet by mouth Daily.     No current facility-administered medications for this visit.    REVIEW OF SYSTEMS:   Constitutional: Denies fevers, chills or abnormal night sweats Eyes: Denies blurriness of vision, double vision or watery eyes Ears, nose, mouth, throat, and face: Denies mucositis or sore throat Respiratory: Denies cough, dyspnea or wheezes Cardiovascular: Denies palpitation, chest discomfort or lower extremity swelling Gastrointestinal:  Denies nausea, heartburn or change in bowel habits Skin: Denies abnormal skin rashes Lymphatics: Denies new lymphadenopathy or easy bruising Neurological:Denies numbness, tingling or new weaknesses Behavioral/Psych: Mood is  stable, no new changes  Breast: Palpable mass in the left breast All other systems were reviewed with the patient and are negative.  PHYSICAL EXAMINATION: ECOG PERFORMANCE STATUS: 1 - Symptomatic but completely ambulatory  Filed Vitals:   02/16/15 0908  BP: 160/69  Pulse: 63  Temp: 97.9 F (36.6 C)  Resp: 19   Filed Weights   02/16/15 0908  Weight: 153 lb 14.4 oz (69.809 kg)    GENERAL:alert, no distress and comfortable SKIN: skin color, texture, turgor are normal, no rashes or significant lesions EYES: normal, conjunctiva are pink and non-injected, sclera clear OROPHARYNX:no exudate, no erythema and lips, buccal mucosa, and tongue normal  NECK: supple, thyroid normal size, non-tender, without nodularity LYMPH:  no palpable lymphadenopathy in the cervical, axillary or inguinal LUNGS: Diminished breath sounds at  the bases related to emphysema HEART: regular rate & rhythm and no murmurs and no lower extremity edema, ejection systolic murmur in the aortic area ABDOMEN:abdomen soft, non-tender and normal bowel sounds Musculoskeletal:no cyanosis of digits and no clubbing  PSYCH: alert & oriented x 3 with fluent speech NEURO: no focal motor/sensory deficits BREAST: Large palpable mass in the left breast along with enlarged left axillary lymph node that is palpable. (exam performed in the presence of a chaperone)   LABORATORY DATA:  I have reviewed the data as listed Lab Results  Component Value Date   WBC 11.0* 02/16/2015   HGB 13.1 02/16/2015   HCT 41.4 02/16/2015   MCV 99.0 02/16/2015   PLT 361 02/16/2015   Lab Results  Component Value Date   NA 137 02/16/2015   K 3.9 02/16/2015   CL 98 03/25/2012   CO2 24 02/16/2015    ASSESSMENT AND PLAN:  Breast cancer of lower-inner quadrant of left female breast ([place]) Left breast biopsy 8:00: IDC with DCIS grade 2-3, ER 95%, PR 60%, Ki-67 15%, HER-2 negative ratio 1.31; left axillary lymph node biopsy positive for metastatic  carcinoma with extracapsular extension. Area of palpable concern left breast lower inner quadrant focal skin thickening with architectural distortion, ultrasound showed 8:30: 1.1 x 1.7 x 0.9 cm abnormality, left axilla to pathologic axillary lymph nodes cortical thickening 1.3 cm, 9 mm Clinical stage: T1cN1 (stage II a)  Pathology and radiology review: Discussed with the patient, the details of pathology including the type of breast cancer,the clinical staging, the significance of ER, PR and HER-2/neu receptors and the implications for treatment. After reviewing the pathology in detail, we proceeded to discuss the different treatment options between surgery, radiation, chemotherapy, antiestrogen therapies.  Recommendation: 1. Breast conserving surgery + axillary lymph node dissection (since she has clinically enlarged lymph nodes) 2. Followed by adjuvant radiation 3. Followed by antiestrogen therapy   Because of her age and her chronic health problems, she is not a candidate for systemic chemotherapy.  Return to clinic after surgery to discuss adjuvant treatment plan.  All questions were answered. The patient knows to call the clinic with any problems, questions or concerns.    Rulon Eisenmenger, MD 02/16/2015

## 2015-02-22 ENCOUNTER — Encounter: Payer: Self-pay | Admitting: *Deleted

## 2015-02-22 ENCOUNTER — Telehealth: Payer: Self-pay | Admitting: *Deleted

## 2015-02-22 NOTE — Progress Notes (Signed)
Pt return call. Denies questions or concerns regarding dx or treatment care plan. Encourage pt to call with needs. Received verbal understanding. Contact information provided.

## 2015-02-22 NOTE — Telephone Encounter (Signed)
Left vm for pt to return call regarding [place] from 02/18/15. Contact information provided.

## 2015-02-23 ENCOUNTER — Encounter: Payer: Self-pay | Admitting: *Deleted

## 2015-03-02 ENCOUNTER — Encounter: Payer: Self-pay | Admitting: Hematology and Oncology

## 2015-03-03 ENCOUNTER — Other Ambulatory Visit: Payer: Self-pay | Admitting: Surgery

## 2015-03-03 DIAGNOSIS — C50912 Malignant neoplasm of unspecified site of left female breast: Secondary | ICD-10-CM

## 2015-03-08 ENCOUNTER — Other Ambulatory Visit: Payer: Self-pay | Admitting: Surgery

## 2015-03-08 ENCOUNTER — Telehealth: Payer: Self-pay | Admitting: Hematology and Oncology

## 2015-03-08 ENCOUNTER — Other Ambulatory Visit: Payer: Self-pay | Admitting: *Deleted

## 2015-03-08 DIAGNOSIS — C50912 Malignant neoplasm of unspecified site of left female breast: Secondary | ICD-10-CM

## 2015-03-08 NOTE — Telephone Encounter (Signed)
Mailed letter to patient. Made patient aware of upcoming appointment.       [facility].

## 2015-03-16 ENCOUNTER — Encounter (HOSPITAL_COMMUNITY)
Admission: RE | Admit: 2015-03-16 | Discharge: 2015-03-16 | Disposition: A | Discharge status: Home / Self Care | Payer: Medicare Other | Source: Ambulatory Visit | Attending: Surgery | Admitting: Surgery

## 2015-03-16 ENCOUNTER — Other Ambulatory Visit: Payer: Self-pay

## 2015-03-16 ENCOUNTER — Encounter (HOSPITAL_COMMUNITY): Payer: Self-pay

## 2015-03-16 DIAGNOSIS — Z87891 Personal history of nicotine dependence: Secondary | ICD-10-CM | POA: Diagnosis not present

## 2015-03-16 DIAGNOSIS — Z8673 Personal history of transient ischemic attack (TIA), and cerebral infarction without residual deficits: Secondary | ICD-10-CM | POA: Diagnosis not present

## 2015-03-16 DIAGNOSIS — Z7902 Long term (current) use of antithrombotics/antiplatelets: Secondary | ICD-10-CM | POA: Insufficient documentation

## 2015-03-16 DIAGNOSIS — E039 Hypothyroidism, unspecified: Secondary | ICD-10-CM | POA: Insufficient documentation

## 2015-03-16 DIAGNOSIS — Z79899 Other long term (current) drug therapy: Secondary | ICD-10-CM | POA: Diagnosis not present

## 2015-03-16 DIAGNOSIS — I10 Essential (primary) hypertension: Secondary | ICD-10-CM | POA: Insufficient documentation

## 2015-03-16 DIAGNOSIS — Z01812 Encounter for preprocedural laboratory examination: Secondary | ICD-10-CM | POA: Diagnosis not present

## 2015-03-16 DIAGNOSIS — Z01818 Encounter for other preprocedural examination: Secondary | ICD-10-CM | POA: Insufficient documentation

## 2015-03-16 DIAGNOSIS — I4891 Unspecified atrial fibrillation: Secondary | ICD-10-CM | POA: Diagnosis not present

## 2015-03-16 DIAGNOSIS — J449 Chronic obstructive pulmonary disease, unspecified: Secondary | ICD-10-CM | POA: Diagnosis not present

## 2015-03-16 DIAGNOSIS — C50919 Malignant neoplasm of unspecified site of unspecified female breast: Secondary | ICD-10-CM | POA: Insufficient documentation

## 2015-03-16 HISTORY — DX: Anxiety disorder, unspecified: F41.9

## 2015-03-16 HISTORY — DX: Reserved for inherently not codable concepts without codable children: IMO0001

## 2015-03-16 HISTORY — DX: Polyneuropathy, unspecified: G62.9

## 2015-03-16 HISTORY — DX: Pneumonia, unspecified organism: J18.9

## 2015-03-16 HISTORY — DX: Localized edema: R60.0

## 2015-03-16 HISTORY — DX: Personal history of other diseases of the respiratory system: Z87.09

## 2015-03-16 HISTORY — DX: Pain in unspecified joint: M25.50

## 2015-03-16 HISTORY — DX: Personal history of colonic polyps: Z86.010

## 2015-03-16 HISTORY — DX: Hypothyroidism, unspecified: E03.9

## 2015-03-16 HISTORY — DX: Other skin changes: R23.8

## 2015-03-16 HISTORY — DX: Edema, unspecified: R60.9

## 2015-03-16 HISTORY — DX: Spontaneous ecchymoses: R23.3

## 2015-03-16 HISTORY — DX: Emphysema (subcutaneous) resulting from a procedure, initial encounter: T81.82XA

## 2015-03-16 HISTORY — DX: Personal history of colon polyps, unspecified: Z86.0100

## 2015-03-16 LAB — CBC WITH DIFFERENTIAL/PLATELET
BASOS ABS: 0.1 10*3/uL (ref 0.0–0.1)
BASOS PCT: 1 %
EOS ABS: 0.1 10*3/uL (ref 0.0–0.7)
EOS PCT: 1 %
HCT: 41.8 % (ref 36.0–46.0)
HEMOGLOBIN: 13.7 g/dL (ref 12.0–15.0)
LYMPHS ABS: 1.4 10*3/uL (ref 0.7–4.0)
Lymphocytes Relative: 13 %
MCH: 32.6 pg (ref 26.0–34.0)
MCHC: 32.8 g/dL (ref 30.0–36.0)
MCV: 99.5 fL (ref 78.0–100.0)
Monocytes Absolute: 0.8 10*3/uL (ref 0.1–1.0)
Monocytes Relative: 7 %
NEUTROS PCT: 78 %
Neutro Abs: 8.3 10*3/uL — ABNORMAL HIGH (ref 1.7–7.7)
PLATELETS: 322 10*3/uL (ref 150–400)
RBC: 4.2 MIL/uL (ref 3.87–5.11)
RDW: 13.9 % (ref 11.5–15.5)
WBC: 10.7 10*3/uL — AB (ref 4.0–10.5)

## 2015-03-16 LAB — COMPREHENSIVE METABOLIC PANEL
ALBUMIN: 3.7 g/dL (ref 3.5–5.0)
ALT: 22 U/L (ref 14–54)
AST: 25 U/L (ref 15–41)
Alkaline Phosphatase: 50 U/L (ref 38–126)
Anion gap: 11 (ref 5–15)
BUN: 19 mg/dL (ref 6–20)
CHLORIDE: 102 mmol/L (ref 101–111)
CO2: 27 mmol/L (ref 22–32)
CREATININE: 1.25 mg/dL — AB (ref 0.44–1.00)
Calcium: 9.5 mg/dL (ref 8.9–10.3)
GFR calc non Af Amer: 40 mL/min — ABNORMAL LOW (ref >60)
GFR, EST AFRICAN AMERICAN: 46 mL/min — AB (ref >60)
GLUCOSE: 106 mg/dL — AB (ref 65–99)
Potassium: 4.4 mmol/L (ref 3.5–5.1)
SODIUM: 140 mmol/L (ref 135–145)
Total Bilirubin: 0.4 mg/dL (ref 0.3–1.2)
Total Protein: 7.2 g/dL (ref 6.5–8.1)

## 2015-03-16 MED ORDER — CHLORHEXIDINE GLUCONATE 4 % EX LIQD: 1.0000 "application " | Freq: Once | CUTANEOUS | Status: DC

## 2015-03-16 NOTE — Pre-Procedure Instructions (Signed)
[name]  03/16/2015      [id number] [place] [id number]: [phone]: [phone] [name] [address] [id number]: [phone]: [phone]   Your procedure is scheduled on Tues, Jan 24 @ 10:45 AM  [facility]  Call this number if you have problems the morning of surgery:  [phone]  Remember:  Do not eat food or drink liquids after midnight.  Take these medicines the morning of surgery with A SIP OF WATER Albuterol<Bring Your Inhaler With You>,Amiodarone(Pacerone),Amlodipine(Norvasc),Synthroid(Levothyroxine),Ativan(Lorazepam),Dulera(Mometasone),and Spiriva.               No Goody's,BC's,Aleve,Aspirin,Ibuprofen,Fish Oil,Motrin,Advil,or any Herbal Medications.    Do not wear jewelry, make-up or nail polish.  Do not wear lotions, powders, or perfumes.    Do not shave 48 hours prior to surgery.    Do not bring valuables to the hospital.  [facility] any belongings or valuables.  Contacts, dentures or bridgework may not be worn into surgery.  Leave your suitcase in the car.  After surgery it may be brought to your room.  For patients admitted to the hospital, discharge time will be determined by your treatment team.  Patients discharged the day of surgery will not be allowed to drive home.    Special instructions:  [facility] - Preparing for Surgery  Before surgery, you can play an important role.  Because skin is not sterile, your skin needs to be as free of germs as possible.  You can reduce the number of germs on you skin by washing with CHG (chlorahexidine gluconate) soap before surgery.  CHG is an antiseptic cleaner which kills germs and bonds with the skin to continue killing  germs even after washing.  Please DO NOT use if you have an allergy to CHG or antibacterial soaps.  If your skin becomes reddened/irritated stop using the CHG and inform your nurse when you arrive at Short Stay.  Do not shave (including legs and underarms) for at least 48 hours prior to the first CHG shower.  You may shave your face.  Please follow these instructions carefully:   1.  Shower with CHG Soap the night before surgery and the                                morning of Surgery.  2.  If you choose to wash your hair, wash your hair first as usual with your       normal shampoo.  3.  After you shampoo, rinse your hair and body thoroughly to remove the                      Shampoo.  4.  Use CHG as you would any other liquid soap.  You can apply chg directly       to the skin and wash gently with scrungie or a clean washcloth.  5.  Apply the CHG Soap to your body ONLY FROM THE NECK DOWN.        Do not use on open wounds or open sores.  Avoid contact with your eyes,       ears, mouth  and genitals (private parts).  Wash genitals (private parts)       with your normal soap.  6.  Wash thoroughly, paying special attention to the area where your surgery        will be performed.  7.  Thoroughly rinse your body with warm water from the neck down.  8.  DO NOT shower/wash with your normal soap after using and rinsing off       the CHG Soap.  9.  Pat yourself dry with a clean towel.            10.  Wear clean pajamas.            11.  Place clean sheets on your bed the night of your first shower and do not        sleep with pets.  Day of Surgery  Do not apply any lotions/deoderants the morning of surgery.  Please wear clean clothes to the hospital/surgery center.    Please read over the following fact sheets that you were given. Pain Booklet, Coughing and Deep Breathing and Surgical Site Infection Prevention

## 2015-03-16 NOTE — Progress Notes (Signed)
Cardiologist is Dr.Harwani and last visit a month ago-to request  Medical Md is Dr.Roy Mellody Drown  Echo report in epic from 2014  Stress test report in epic from 2015  Denies ever having a heart cath  CXR report in epic from 11-11-14  EKG denies in past yr  Pulmonologist is Dr.Clinton Young

## 2015-03-17 NOTE — Progress Notes (Addendum)
Anesthesia Chart Review:  Pt is 81 year old female scheduled for L breast lumpectomy with radioactive seed and axillary lymph node dissection 03/22/2015 with Dr. Keturah Barre. Newman.   Cardiologist is Dr. Terrence Dupont. Pulmonologist is Dr. Baird Lyons. PCP is Dr. [name].   PMH includes:  Atrial fibrillation, HTN, stroke, COPD, hypothyroidism, breast cancer. Former smoker. BMI 27.   Medications include: albuterol, amiodarone, amlodipine, lipitor, lasix, levothyroxine, losartan, dulera, xarelto, spironolactone, spiriva. Pt to stop xarelto 3 days prior to surgery.   Preoperative labs reviewed.    Chest x-ray 11/11/14 reviewed. Stable COPD. No active disease.   EKG 03/16/15: Sinus bradycardia (59 bpm) with sinus arrhythmia  Echo 11/06/12 (Harwani) does not list necessary measurements to evaluate aortic stenosis.   Echo 03/26/12:  - Left ventricle: The cavity size was normal. Systolic function was normal. The estimated ejection fraction was in the range of 55% to 60%. Wall motion was normal; there were no regional wall motion abnormalities. - Aortic valve: There was moderate to severe stenosis. Valve area: 0.76cm^2(VTI). Valve area: 0.74cm^2 (Vmax). - Atrial septum: No defect or patent foramen ovale was identified. - Pulmonary arteries: Systolic pressure was mildly increased. PA peak pressure: 53m Hg (S).  Pt has cardiac clearance from Dr. HTerrence Dupontfor surgery.   Reviewed case with Dr. ETherisa Doyne Given pt hasn't had echo since 2014, pt will need echo prior to surgery to evaluate aortic stenosis. Notified Shavon in Dr. NPollie Friaroffice.   AWilleen Cass FNP-BC [facility]iology Phone: ([phone]/19/2017 12:00 PM  Addendum: Dr. HTerrence Dupontarranged for patient to have a follow-up echo on 03/17/15. Findings showed normal LV size, normal LV systolic function. Mild diastolic relaxation impairment. Mild concentric LVH. Overall wall motion is normal. LV function is normal.  Estimated EF 55-60%. Normal RV size and function. No RVH. Normal LA/RA size. Mild calcification of AV leaflets. Mild AS (AVA 1.44 cm2, pk vel 228 cm/s, mean press grad 10 mmHg, peak press grad 20 mmHg). Trace AR. Mild MR. Trace TR. Atrial septum normal. Aorta is normal.   AMyra Gianotti PA-C [facility]est[facility]iology Phone ([phone]/20/2017 6:16 PM

## 2015-03-18 NOTE — Progress Notes (Signed)
Spoke with Dr. Terrence Dupont, he states the pt. Had ECHO yesterday, he will look at it & fax to [phone] later today.

## 2015-03-21 ENCOUNTER — Ambulatory Visit
Admission: RE | Admit: 2015-03-21 | Discharge: 2015-03-21 | Disposition: A | Discharge status: Home / Self Care | Payer: Medicare Other | Source: Ambulatory Visit | Attending: Surgery | Admitting: Surgery

## 2015-03-21 DIAGNOSIS — C50912 Malignant neoplasm of unspecified site of left female breast: Secondary | ICD-10-CM

## 2015-03-21 MED ORDER — CEFAZOLIN SODIUM-DEXTROSE 2-3 GM-% IV SOLR
2.0000 g | INTRAVENOUS | Status: AC
  Administered 2015-03-22: 2 g via INTRAVENOUS
  Filled 2015-03-21: qty 50

## 2015-03-21 NOTE — H&P (Signed)
[name]  Location: [facility] Patient #: [id number] DOB: 11/24/1934 Undefined / Language: Cleophus Molt / Race: White Female   History of Present Illness   The patient is a 81 year old female who presents with breast cancer.    Her PCP is Dr. Chelsea Aus.  She is at the [facility] On[facility] Drs. Gudena/Kinard  She comes with her sons Deidre Ala and [place].   She felt a mass in the inner aspect of her left breast. Her last mammogram was about 2 years ago. She is not on hormones. She was adopted, therefore does not know her family history. She has had no prior breast biopsy.   She had a mammogram on 01/24/2015 which showed a 1.7 cm mass in the left breast at 8:30 o'clock. She has at least 2 pathologic nodes in her left axilla. She underwent left breast biopsy on 02/07/2015 ([id number]) - IDC, ER - 95%, PR 60%, Ki67 - 15%, Her2Neu - negative. Her left axillary node biopsy was positive.  I discussed the options for breast cancer treatment with the patient. [facility], which includes medical oncology and radiation oncology. I discussed the surgical options of lumpectomy vs. mastectomy. If mastectomy, there is the possibility of reconstruction. I discussed the options of lymph node biopsy. The treatment plan depends on the pathologic staging of the tumor and the patient's personal wishes. The risks of surgery include, but are not limited to, bleeding, infection, the need for further surgery, and nerve injury. The patient has been given literature on the treatment of breast cancer.  Plan: 1) Cardiac clearance by Dr. Terrence Dupont and I want her to stop the Xarelto for 7 days before surgery, 2) left breast lumpectomy (seed loc) and left axillary node dissection, 3) mammoprint testing on lumpectomy, 4) radiation therapy --- Since she is somewhat frail - she may get only anti hormone tx pos top (after rad tx)  Past Medical  History 1. COPD/emphysema quit smoking in 2001 On 3 inhalers - sees Dr. Keturah Barre 2. Stroke 2013 Left sided weakness/change in gait 3. Cholecystectomy 4. Hemorrhoidectomy - 2004 - [facility] 5. Colonoscopy - 2016 - [facility] 6. Right hip pain Not seeing ortho 7. On Xarelto for stroke   Social History She is widowed. She comes with her sons Deidre Ala and [place].   Other Problems Conni Slipper, RN; 02/16/2015 8:15 AM) Anxiety Disorder Arthritis Atrial Fibrillation Breast Cancer Cerebrovascular Accident Chest pain Chronic Obstructive Lung Disease Emphysema Of Lung High blood pressure Lump In Breast Thyroid Disease  Past Surgical History Conni Slipper, RN; 02/16/2015 8:15 AM) Appendectomy Breast Biopsy Left. multiple Cataract Surgery Bilateral. Colon Polyp Removal - Colonoscopy Gallbladder Surgery - Laparoscopic Hemorrhoidectomy Oral Surgery Tonsillectomy  Diagnostic Studies History Conni Slipper, RN; 02/16/2015 8:15 AM) Colonoscopy within last year Mammogram within last year Pap Smear >5 years ago  Medication History Conni Slipper, RN; 02/16/2015 8:15 AM) No Current Medications Medications Reconciled  Social History Conni Slipper, RN; 02/16/2015 8:15 AM) Alcohol use Occasional alcohol use. Caffeine use Carbonated beverages, Coffee, Tea. No drug use Tobacco use Former smoker.  Family History Conni Slipper, RN; 02/16/2015 8:15 AM) Family history unknown First Degree Relatives Heart Disease Son.  Pregnancy / Birth History Conni Slipper, RN; 02/16/2015 8:15 AM) Age at menarche 54 years. Age of menopause 53-55 Contraceptive History Oral contraceptives. Gravida 4 Irregular periods Maternal age 46-20 Para 3    Review of Systems Conni Slipper RN; 02/16/2015 8:15 AM) General Present- Fatigue. Not Present-  Appetite Loss, Chills, Fever, Night Sweats, Weight Gain and Weight Loss. Skin Not Present-  Change in Wart/Mole, Dryness, Hives, Jaundice, New Lesions, Non-Healing Wounds, Rash and Ulcer. HEENT Present- Wears glasses/contact lenses. Not Present- Earache, Hearing Loss, Hoarseness, Nose Bleed, Oral Ulcers, Ringing in the Ears, Seasonal Allergies, Sinus Pain, Sore Throat, Visual Disturbances and Yellow Eyes. Respiratory Not Present- Bloody sputum, Chronic Cough, Difficulty Breathing, Snoring and Wheezing. Breast Present- Breast Mass. Not Present- Breast Pain, Nipple Discharge and Skin Changes. Cardiovascular Present- Palpitations. Not Present- Chest Pain, Difficulty Breathing Lying Down, Leg Cramps, Rapid Heart Rate, Shortness of Breath and Swelling of Extremities. Gastrointestinal Not Present- Abdominal Pain, Bloating, Bloody Stool, Change in Bowel Habits, Chronic diarrhea, Constipation, Difficulty Swallowing, Excessive gas, Gets full quickly at meals, Hemorrhoids, Indigestion, Nausea, Rectal Pain and Vomiting. Female Genitourinary Not Present- Frequency, Nocturia, Painful Urination, Pelvic Pain and Urgency. Musculoskeletal Not Present- Back Pain, Joint Pain, Joint Stiffness, Muscle Pain, Muscle Weakness and Swelling of Extremities. Neurological Present- Trouble walking. Not Present- Decreased Memory, Fainting, Headaches, Numbness, Seizures, Tingling, Tremor and Weakness. Psychiatric Present- Anxiety. Not Present- Bipolar, Change in Sleep Pattern, Depression, Fearful and Frequent crying. Hematology Present- Excessive bleeding. Not Present- Easy Bruising, Gland problems, HIV and Persistent Infections.  Physical Exam  General: Older WF alert. HEENT: Normal. Pupils equal.  Neck: Supple. No mass. No thyroid mass. Lymph Nodes: No supraclavicular or cervical nodes.  Lungs: Clear to auscultation and symmetric breath sounds. Heart: RRR. No murmur or rub.  Breast: Right - no mass Left - she has a 5 cm bruise and a 2 cm mass at the 9 o'clock position of the left breast  Abdomen: Soft. No mass.  No tenderness. No hernia. Normal bowel sounds. No abdominal scars. Rectal: Not done.  Extremities: Some weakness no her left side. Needs some assistance to get on exam table.  Neurologic: Some weakness on her left side. Psychiatric: Has normal mood and affect. Behavior is normal.  Assessment & Plan  1  CANCER OF LEFT BREAST, STAGE 2 (C50.912)  Story: She had a mammogram on 01/24/2015 which showed a 1.7 cm mass in left breast the 8:30 o'clock. She underwent left breast biopsy on 02/07/2015 ([id number]) - IDC, ER - 95%, PR 60%, Ki67 - 15%, Her2Neu - negative.  Lymph node is positive   Oncology - Gudena/Kinard   Addendum Note(Zyona Pettaway H. Lucia Gaskins MD; 02/24/2015 3:02 PM) I got cardiac clearance from Dr. Terrence Dupont. He mentions stopping the Xarelto only 3 days ahead - but I have had trouble with this too narrow of window, so I will ask the patient to stop 5 days ahead.   Addendum Note(Kysen Wetherington H. Lucia Gaskins MD; 03/17/2015 5:45 PM) Anesthesia wants an echo priior to surgery. Dr. Terrence Dupont is not sure it is necessary, but he is going to do it. Her lumpectomy is scheduled 03/22/2015.  2. COPD/emphysema quit smoking in 2001 On 3 inhalers - sees Dr. Keturah Barre 3. Stroke 2013  Left sided weakness/change in gait 4. Cholecystectomy 6. Hemorrhoidectomy - 2004 - [place] 7. Right hip pain Not seeing ortho 8. On Xarelto for stroke  To stop 5 days ahead of surgery   Alphonsa Overall, MD, [facility] Surgery Pager: [phone] Office phone:  [phone]

## 2015-03-22 ENCOUNTER — Observation Stay (HOSPITAL_COMMUNITY)
Admission: RE | Admit: 2015-03-22 | Discharge: 2015-03-23 | Disposition: A | Discharge status: Home / Self Care | Payer: Medicare Other | Source: Ambulatory Visit | Attending: Surgery | Admitting: Surgery

## 2015-03-22 ENCOUNTER — Ambulatory Visit (HOSPITAL_COMMUNITY): Payer: Medicare Other | Admitting: Emergency Medicine

## 2015-03-22 ENCOUNTER — Encounter (HOSPITAL_COMMUNITY): Payer: Self-pay | Admitting: Certified Registered Nurse Anesthetist

## 2015-03-22 ENCOUNTER — Ambulatory Visit
Admission: RE | Admit: 2015-03-22 | Discharge: 2015-03-22 | Disposition: A | Discharge status: Home / Self Care | Payer: Medicare Other | Source: Ambulatory Visit | Attending: Surgery | Admitting: Surgery

## 2015-03-22 ENCOUNTER — Ambulatory Visit (HOSPITAL_COMMUNITY): Payer: Medicare Other | Admitting: Certified Registered Nurse Anesthetist

## 2015-03-22 ENCOUNTER — Encounter (HOSPITAL_COMMUNITY)
Admission: RE | Disposition: A | Discharge status: Home / Self Care | Payer: Self-pay | Source: Ambulatory Visit | Attending: Surgery

## 2015-03-22 DIAGNOSIS — M797 Fibromyalgia: Secondary | ICD-10-CM | POA: Insufficient documentation

## 2015-03-22 DIAGNOSIS — Z7901 Long term (current) use of anticoagulants: Secondary | ICD-10-CM | POA: Insufficient documentation

## 2015-03-22 DIAGNOSIS — Z170 Estrogen receptor positive status [ER+]: Secondary | ICD-10-CM | POA: Diagnosis not present

## 2015-03-22 DIAGNOSIS — Z79899 Other long term (current) drug therapy: Secondary | ICD-10-CM | POA: Diagnosis not present

## 2015-03-22 DIAGNOSIS — I10 Essential (primary) hypertension: Secondary | ICD-10-CM | POA: Diagnosis not present

## 2015-03-22 DIAGNOSIS — I4891 Unspecified atrial fibrillation: Secondary | ICD-10-CM | POA: Diagnosis not present

## 2015-03-22 DIAGNOSIS — C50912 Malignant neoplasm of unspecified site of left female breast: Secondary | ICD-10-CM

## 2015-03-22 DIAGNOSIS — Z7951 Long term (current) use of inhaled steroids: Secondary | ICD-10-CM | POA: Insufficient documentation

## 2015-03-22 DIAGNOSIS — F419 Anxiety disorder, unspecified: Secondary | ICD-10-CM | POA: Insufficient documentation

## 2015-03-22 DIAGNOSIS — Z87891 Personal history of nicotine dependence: Secondary | ICD-10-CM | POA: Diagnosis not present

## 2015-03-22 DIAGNOSIS — C773 Secondary and unspecified malignant neoplasm of axilla and upper limb lymph nodes: Secondary | ICD-10-CM | POA: Insufficient documentation

## 2015-03-22 DIAGNOSIS — C50312 Malignant neoplasm of lower-inner quadrant of left female breast: Secondary | ICD-10-CM | POA: Diagnosis not present

## 2015-03-22 DIAGNOSIS — E039 Hypothyroidism, unspecified: Secondary | ICD-10-CM | POA: Insufficient documentation

## 2015-03-22 DIAGNOSIS — Z8673 Personal history of transient ischemic attack (TIA), and cerebral infarction without residual deficits: Secondary | ICD-10-CM | POA: Diagnosis not present

## 2015-03-22 DIAGNOSIS — J439 Emphysema, unspecified: Secondary | ICD-10-CM | POA: Insufficient documentation

## 2015-03-22 DIAGNOSIS — C50919 Malignant neoplasm of unspecified site of unspecified female breast: Secondary | ICD-10-CM | POA: Diagnosis present

## 2015-03-22 HISTORY — PX: BREAST LUMPECTOMY WITH RADIOACTIVE SEED AND AXILLARY LYMPH NODE DISSECTION: SHX6656

## 2015-03-22 LAB — PROTIME-INR
INR: 1.22 (ref 0.00–1.49)
PROTHROMBIN TIME: 15.6 s — AB (ref 11.6–15.2)

## 2015-03-22 SURGERY — BREAST LUMPECTOMY WITH RADIOACTIVE SEED AND AXILLARY LYMPH NODE DISSECTION
Anesthesia: General | Site: Breast | Laterality: Left

## 2015-03-22 MED ORDER — FENTANYL CITRATE (PF) 250 MCG/5ML IJ SOLN
INTRAMUSCULAR | Status: AC
  Filled 2015-03-22: qty 5

## 2015-03-22 MED ORDER — HEPARIN SODIUM (PORCINE) 5000 UNIT/ML IJ SOLN
5000.0000 [IU] | Freq: Three times a day (TID) | INTRAMUSCULAR | Status: DC
  Administered 2015-03-22 – 2015-03-23 (×2): 5000 [IU] via SUBCUTANEOUS
  Filled 2015-03-22 (×2): qty 1

## 2015-03-22 MED ORDER — LEVOTHYROXINE SODIUM 25 MCG PO TABS
137.0000 ug | ORAL_TABLET | Freq: Every day | ORAL | Status: DC
  Administered 2015-03-23: 137 ug via ORAL
  Filled 2015-03-22: qty 1

## 2015-03-22 MED ORDER — MORPHINE SULFATE (PF) 2 MG/ML IV SOLN
1.0000 mg | INTRAVENOUS | Status: DC | PRN
  Administered 2015-03-22: 2 mg via INTRAVENOUS
  Filled 2015-03-22: qty 1

## 2015-03-22 MED ORDER — BUPIVACAINE HCL (PF) 0.5 % IJ SOLN
INTRAMUSCULAR | Status: DC | PRN
  Administered 2015-03-22: 30 mL via PERINEURAL

## 2015-03-22 MED ORDER — ONDANSETRON HCL 4 MG/2ML IJ SOLN
INTRAMUSCULAR | Status: DC | PRN
  Administered 2015-03-22: 4 mg via INTRAVENOUS

## 2015-03-22 MED ORDER — IBUPROFEN 600 MG PO TABS
600.0000 mg | ORAL_TABLET | Freq: Four times a day (QID) | ORAL | Status: DC | PRN
  Administered 2015-03-22 – 2015-03-23 (×3): 600 mg via ORAL
  Filled 2015-03-22 (×3): qty 1

## 2015-03-22 MED ORDER — HYDROMORPHONE HCL 1 MG/ML IJ SOLN
0.2500 mg | INTRAMUSCULAR | Status: DC | PRN
  Administered 2015-03-22 (×2): 0.25 mg via INTRAVENOUS

## 2015-03-22 MED ORDER — ALBUTEROL SULFATE (2.5 MG/3ML) 0.083% IN NEBU: 2.5000 mg | INHALATION_SOLUTION | Freq: Four times a day (QID) | RESPIRATORY_TRACT | Status: DC | PRN

## 2015-03-22 MED ORDER — BUPIVACAINE-EPINEPHRINE 0.25% -1:200000 IJ SOLN
INTRAMUSCULAR | Status: DC | PRN
  Administered 2015-03-22: 20 mL

## 2015-03-22 MED ORDER — EPHEDRINE SULFATE 50 MG/ML IJ SOLN
INTRAMUSCULAR | Status: DC | PRN
  Administered 2015-03-22: 5 mg via INTRAVENOUS
  Administered 2015-03-22: 10 mg via INTRAVENOUS

## 2015-03-22 MED ORDER — ONDANSETRON HCL 4 MG/2ML IJ SOLN: 4.0000 mg | Freq: Four times a day (QID) | INTRAMUSCULAR | Status: DC | PRN

## 2015-03-22 MED ORDER — ONDANSETRON 4 MG PO TBDP: 4.0000 mg | ORAL_TABLET | Freq: Four times a day (QID) | ORAL | Status: DC | PRN

## 2015-03-22 MED ORDER — PROPOFOL 10 MG/ML IV BOLUS
INTRAVENOUS | Status: AC
  Filled 2015-03-22: qty 20

## 2015-03-22 MED ORDER — HYDROCODONE-ACETAMINOPHEN 5-325 MG PO TABS
1.0000 | ORAL_TABLET | ORAL | Status: DC | PRN
  Filled 2015-03-22: qty 2

## 2015-03-22 MED ORDER — SPIRONOLACTONE 25 MG PO TABS: 25.0000 mg | ORAL_TABLET | Freq: Every day | ORAL | Status: DC

## 2015-03-22 MED ORDER — ALBUTEROL SULFATE HFA 108 (90 BASE) MCG/ACT IN AERS: 2.0000 | INHALATION_SPRAY | Freq: Four times a day (QID) | RESPIRATORY_TRACT | Status: DC | PRN

## 2015-03-22 MED ORDER — MIDAZOLAM HCL 2 MG/2ML IJ SOLN
INTRAMUSCULAR | Status: AC
  Filled 2015-03-22: qty 2

## 2015-03-22 MED ORDER — AMIODARONE HCL 200 MG PO TABS: 100.0000 mg | ORAL_TABLET | Freq: Every day | ORAL | Status: DC

## 2015-03-22 MED ORDER — 0.9 % SODIUM CHLORIDE (POUR BTL) OPTIME
TOPICAL | Status: DC | PRN
  Administered 2015-03-22: 1000 mL

## 2015-03-22 MED ORDER — BUPIVACAINE-EPINEPHRINE (PF) 0.25% -1:200000 IJ SOLN
INTRAMUSCULAR | Status: AC
  Filled 2015-03-22: qty 30

## 2015-03-22 MED ORDER — AMLODIPINE BESYLATE 2.5 MG PO TABS: 2.5000 mg | ORAL_TABLET | Freq: Every day | ORAL | Status: DC

## 2015-03-22 MED ORDER — GLYCOPYRROLATE 0.2 MG/ML IJ SOLN
INTRAMUSCULAR | Status: AC
  Filled 2015-03-22: qty 1

## 2015-03-22 MED ORDER — SUCCINYLCHOLINE CHLORIDE 20 MG/ML IJ SOLN
INTRAMUSCULAR | Status: DC | PRN
  Administered 2015-03-22: 120 mg via INTRAVENOUS

## 2015-03-22 MED ORDER — LOSARTAN POTASSIUM 50 MG PO TABS: 100.0000 mg | ORAL_TABLET | Freq: Every day | ORAL | Status: DC

## 2015-03-22 MED ORDER — LIDOCAINE HCL (CARDIAC) 20 MG/ML IV SOLN
INTRAVENOUS | Status: AC
  Filled 2015-03-22: qty 5

## 2015-03-22 MED ORDER — DEXAMETHASONE SODIUM PHOSPHATE 4 MG/ML IJ SOLN
INTRAMUSCULAR | Status: DC | PRN
  Administered 2015-03-22: 10 mg via INTRAVENOUS

## 2015-03-22 MED ORDER — GLYCOPYRROLATE 0.2 MG/ML IJ SOLN
INTRAMUSCULAR | Status: DC | PRN
  Administered 2015-03-22: .15 mg via INTRAVENOUS
  Administered 2015-03-22: 0.1 mg via INTRAVENOUS

## 2015-03-22 MED ORDER — DEXAMETHASONE SODIUM PHOSPHATE 10 MG/ML IJ SOLN
INTRAMUSCULAR | Status: AC
  Filled 2015-03-22: qty 1

## 2015-03-22 MED ORDER — KCL IN DEXTROSE-NACL 20-5-0.45 MEQ/L-%-% IV SOLN
INTRAVENOUS | Status: DC
  Administered 2015-03-22 – 2015-03-23 (×2): via INTRAVENOUS
  Filled 2015-03-22 (×2): qty 1000

## 2015-03-22 MED ORDER — MIDAZOLAM HCL 5 MG/5ML IJ SOLN
INTRAMUSCULAR | Status: DC | PRN
  Administered 2015-03-22 (×2): 1 mg via INTRAVENOUS

## 2015-03-22 MED ORDER — ATROPINE SULFATE 0.1 MG/ML IJ SOLN
INTRAMUSCULAR | Status: AC
  Filled 2015-03-22: qty 10

## 2015-03-22 MED ORDER — PROPOFOL 10 MG/ML IV BOLUS
INTRAVENOUS | Status: DC | PRN
  Administered 2015-03-22: 160 mg via INTRAVENOUS

## 2015-03-22 MED ORDER — LIDOCAINE HCL (CARDIAC) 20 MG/ML IV SOLN
INTRAVENOUS | Status: DC | PRN
  Administered 2015-03-22: 100 mg via INTRAVENOUS

## 2015-03-22 MED ORDER — PROMETHAZINE HCL 25 MG/ML IJ SOLN: 6.2500 mg | INTRAMUSCULAR | Status: DC | PRN

## 2015-03-22 MED ORDER — MOMETASONE FURO-FORMOTEROL FUM 100-5 MCG/ACT IN AERO
2.0000 | INHALATION_SPRAY | Freq: Two times a day (BID) | RESPIRATORY_TRACT | Status: DC
  Administered 2015-03-23: 2 via RESPIRATORY_TRACT
  Filled 2015-03-22: qty 8.8

## 2015-03-22 MED ORDER — ATROPINE SULFATE 1 MG/ML IJ SOLN
INTRAMUSCULAR | Status: DC | PRN
  Administered 2015-03-22: 0.2 mg via INTRAVENOUS
  Administered 2015-03-22 (×3): .1 mg via INTRAVENOUS

## 2015-03-22 MED ORDER — HYDROMORPHONE HCL 1 MG/ML IJ SOLN
INTRAMUSCULAR | Status: AC
  Filled 2015-03-22: qty 1

## 2015-03-22 MED ORDER — LACTATED RINGERS IV SOLN
INTRAVENOUS | Status: DC
  Administered 2015-03-22 (×3): via INTRAVENOUS

## 2015-03-22 MED ORDER — FENTANYL CITRATE (PF) 100 MCG/2ML IJ SOLN
INTRAMUSCULAR | Status: DC | PRN
  Administered 2015-03-22 (×5): 50 ug via INTRAVENOUS

## 2015-03-22 MED ORDER — TIOTROPIUM BROMIDE MONOHYDRATE 18 MCG IN CAPS
18.0000 ug | ORAL_CAPSULE | Freq: Every day | RESPIRATORY_TRACT | Status: DC
  Filled 2015-03-22: qty 5

## 2015-03-22 MED ORDER — ROCURONIUM BROMIDE 50 MG/5ML IV SOLN
INTRAVENOUS | Status: AC
  Filled 2015-03-22: qty 2

## 2015-03-22 SURGICAL SUPPLY — 50 items
APPLIER CLIP 9.375 MED OPEN (MISCELLANEOUS)
BINDER BREAST LRG (GAUZE/BANDAGES/DRESSINGS) IMPLANT
BINDER BREAST XLRG (GAUZE/BANDAGES/DRESSINGS) IMPLANT
BLADE SURG 15 STRL LF DISP TIS (BLADE) ×1 IMPLANT
BLADE SURG 15 STRL SS (BLADE) ×2
CANISTER SUCTION 2500CC (MISCELLANEOUS) IMPLANT
CHLORAPREP W/TINT 26ML (MISCELLANEOUS) ×3 IMPLANT
CLIP APPLIE 9.375 MED OPEN (MISCELLANEOUS) IMPLANT
COVER PROBE W GEL 5X96 (DRAPES) ×3 IMPLANT
COVER SURGICAL LIGHT HANDLE (MISCELLANEOUS) ×3 IMPLANT
DEVICE DUBIN SPECIMEN MAMMOGRA (MISCELLANEOUS) ×3 IMPLANT
DRAPE CHEST BREAST 15X10 FENES (DRAPES) ×3 IMPLANT
DRAPE UTILITY XL STRL (DRAPES) ×3 IMPLANT
ELECT CAUTERY BLADE 6.4 (BLADE) ×3 IMPLANT
ELECT REM PT RETURN 9FT ADLT (ELECTROSURGICAL) ×3
ELECTRODE REM PT RTRN 9FT ADLT (ELECTROSURGICAL) ×1 IMPLANT
EVACUATOR SILICONE 100CC (DRAIN) ×3 IMPLANT
GAUZE SPONGE 4X4 12PLY STRL (GAUZE/BANDAGES/DRESSINGS) ×3 IMPLANT
GLOVE BIO SURGEON STRL SZ 6.5 (GLOVE) ×2 IMPLANT
GLOVE BIO SURGEON STRL SZ7 (GLOVE) ×3 IMPLANT
GLOVE BIO SURGEONS STRL SZ 6.5 (GLOVE) ×1
GLOVE BIOGEL PI IND STRL 6.5 (GLOVE) ×1 IMPLANT
GLOVE BIOGEL PI IND STRL 8.5 (GLOVE) ×1 IMPLANT
GLOVE BIOGEL PI INDICATOR 6.5 (GLOVE) ×2
GLOVE BIOGEL PI INDICATOR 8.5 (GLOVE) ×2
GLOVE SURG SIGNA 7.5 PF LTX (GLOVE) ×9 IMPLANT
GOWN STRL REUS W/ TWL LRG LVL3 (GOWN DISPOSABLE) ×1 IMPLANT
GOWN STRL REUS W/ TWL XL LVL3 (GOWN DISPOSABLE) ×2 IMPLANT
GOWN STRL REUS W/TWL LRG LVL3 (GOWN DISPOSABLE) ×2
GOWN STRL REUS W/TWL XL LVL3 (GOWN DISPOSABLE) ×4
KIT BASIN OR (CUSTOM PROCEDURE TRAY) ×3 IMPLANT
KIT MARKER MARGIN INK (KITS) ×3 IMPLANT
LIQUID BAND (GAUZE/BANDAGES/DRESSINGS) ×3 IMPLANT
NEEDLE HYPO 25X1 1.5 SAFETY (NEEDLE) ×3 IMPLANT
NS IRRIG 1000ML POUR BTL (IV SOLUTION) IMPLANT
PACK SURGICAL SETUP 50X90 (CUSTOM PROCEDURE TRAY) ×3 IMPLANT
PENCIL BUTTON HOLSTER BLD 10FT (ELECTRODE) ×3 IMPLANT
SPONGE GAUZE 4X4 12PLY STER LF (GAUZE/BANDAGES/DRESSINGS) ×6 IMPLANT
SPONGE LAP 18X18 X RAY DECT (DISPOSABLE) ×9 IMPLANT
SUT ETHILON 2 0 PSLX (SUTURE) ×3 IMPLANT
SUT ETHILON 3 0 PS 1 (SUTURE) ×3 IMPLANT
SUT MNCRL AB 4-0 PS2 18 (SUTURE) ×3 IMPLANT
SUT VIC AB 3-0 SH 8-18 (SUTURE) ×3 IMPLANT
SYR BULB 3OZ (MISCELLANEOUS) ×3 IMPLANT
SYR CONTROL 10ML LL (SYRINGE) ×6 IMPLANT
TOWEL OR 17X24 6PK STRL BLUE (TOWEL DISPOSABLE) ×3 IMPLANT
TOWEL OR 17X26 10 PK STRL BLUE (TOWEL DISPOSABLE) ×3 IMPLANT
TUBE CONNECTING 12'X1/4 (SUCTIONS)
TUBE CONNECTING 12X1/4 (SUCTIONS) IMPLANT
YANKAUER SUCT BULB TIP NO VENT (SUCTIONS) IMPLANT

## 2015-03-22 NOTE — Anesthesia Preprocedure Evaluation (Addendum)
Anesthesia Evaluation  Patient identified by MRN, date of birth, ID band Patient awake    Reviewed: Allergy & Precautions, NPO status , Patient's Chart, lab work & pertinent test results  Airway Mallampati: II  TM Distance: >3 FB Neck ROM: Full    Dental no notable dental hx. (+) Edentulous Upper, Dental Advisory Given   Pulmonary pneumonia, resolved, COPD, former smoker,    Pulmonary exam normal breath sounds clear to auscultation       Cardiovascular hypertension, Pt. on medications Normal cardiovascular exam Rhythm:Regular Rate:Normal     Neuro/Psych Anxiety Depression CVA, No Residual Symptoms negative neurological ROS  negative psych ROS   GI/Hepatic negative GI ROS, Neg liver ROS,   Endo/Other  Hypothyroidism   Renal/GU negative Renal ROS  negative genitourinary   Musculoskeletal  (+) Arthritis , Fibromyalgia -  Abdominal   Peds negative pediatric ROS (+)  Hematology negative hematology ROS (+)   Anesthesia Other Findings   Reproductive/Obstetrics negative OB ROS                          Anesthesia Physical Anesthesia Plan  ASA: III  Anesthesia Plan: General   Post-op Pain Management: GA combined w/ Regional for post-op pain   Induction: Intravenous  Airway Management Planned: Oral ETT  Additional Equipment:   Intra-op Plan:   Post-operative Plan: Extubation in OR  Informed Consent: I have reviewed the patients History and Physical, chart, labs and discussed the procedure including the risks, benefits and alternatives for the proposed anesthesia with the patient or authorized representative who has indicated his/her understanding and acceptance.   Dental advisory given  Plan Discussed with: CRNA and Anesthesiologist  Anesthesia Plan Comments:         Anesthesia Quick Evaluation

## 2015-03-22 NOTE — Interval H&P Note (Signed)
History and Physical Interval Note:  03/22/2015 10:27 AM  [name]  has presented today for surgery, with the diagnosis of Left breast cancer with positive axillary node  The various methods of treatment have been discussed with the patient and family. She has a seed placed that I checked.  She has a left pectoralis block.  Her two sons, Deidre Ala and Nicki Reaper, are with her.  After consideration of risks, benefits and other options for treatment, the patient has consented to  Procedure(s): BREAST LUMPECTOMY WITH RADIOACTIVE SEED AND AXILLARY LYMPH NODE DISSECTION (Left) as a surgical intervention .  The patient's history has been reviewed, patient examined, no change in status, stable for surgery.  I have reviewed the patient's chart and labs.  Questions were answered to the patient's satisfaction.     Rilie Glanz H

## 2015-03-22 NOTE — Op Note (Signed)
03/22/2015  1:12 PM  PATIENT:  [name] DOB: 07-Oct-1934 MRN: [id number]  PREOP DIAGNOSIS:  Left breast cancer with positive axillary node  POSTOP DIAGNOSIS:   Left breast cancer, 8 o'clock position (T1, N1)  PROCEDURE:   Procedure(s): BREAST LUMPECTOMY WITH RADIOACTIVE SEED AND AXILLARY LYMPH NODE DISSECTION  SURGEON:   Alphonsa Overall, M.D.  ANESTHESIA:   general  Anesthesiologist: Myrtie Soman, MD CRNA: Terrill Mohr, CRNA; Rokoshi T Flowers, Contractor  EBL:  125  ml  DRAINS: 19 F Blake drain  LOCAL MEDICATIONS USED:   20 cc 1/4% marcaine, pectoralis block  SPECIMEN:   Left breast lumpectomy (suture medial), medial margin (suture anterior), superior margin (suture anterior), and left axillary node dissection  COUNTS CORRECT:  YES  INDICATIONS FOR PROCEDURE:  [name] is a 81 y.o. (DOB: Jul 13, 1934) white  female whose primary care physician is MOREIRA,ROY, MD and comes for left breast lumpectomy and left axillary node dissection.   [facility]2015 for left breast cancer.  She also had a biopsy of her left axillary lymph node which was positive.  She saw Drs. Hosie Poisson and Kinard for oncology.   The options for breast cancer treatment have been discussed with the patient. She elected to proceed with lef lumpectomy and left axillary node dissection.    The indications and potential complications of surgery were explained to the patient. Potential complications include, but are not limited to, bleeding, infection, the need for further surgery, and nerve injury.     She had a I131 seed placed on 03/21/2015 in her left breast at [facility].  I confirmed the presence of the I131 seed in the pre op area using the Neoprobe.  The seed is in the 8 o'clock position of the left breast.     OPERATIVE NOTE:   The patient was taken to room # 9 at [facility] where she underwent a general anesthesia  supervised by Anesthesiologist: Myrtie Soman, MD CRNA:  Terrill Mohr, CRNA; Rokoshi T Flowers, Immunologist. Her left breast and axilla were prepped with  ChloraPrep and sterilely draped.    A time-out and the surgical check list was reviewed.    I turned attention to the cancer which was about at the 8 o'clock position of the left breast.   I used the Neoprobe to identify the I131 seed.  I tried to excise an area around the tumor of at least 1 cm.    I excised this block of breast tissue approximately 4 cm by 4 cm  in diameter.   I painted the lumpectomy specimen with the 6 color paint kit and did a specimen mammogram which confirmed the mass, clip, and the seed were all in the right position in the specimen.  The specimen was sent to pathology who called back to confirm that they have the seed and the specimen.   The tumor was part of a dense area of her breast and it was difficult to tell where cancer ended and fibrocystic breast changes began.  So I took additional tissue from the superior margin and medial margin.  I marked each additional specimen with an anterior suture and painted the specimens with two colors.   I then started the left axillary dissection. I made an incision in the left axilla.  I took my dissection cranial to the axillary vein.  She had at least two tributaries that I took down.  She  had a large node (which I think is positive) tucked up in the medial high axilla.  This was swept down with the the other axillary contents.  I tried to removed all level 1 and level 2 nodes.  I thought that several nodes were positive for cancer.  I found the thoracodorsal nerve during the dissection.  I closed the axillary incision in layers:  3-0 vicryl for the deep subcutaneous tissues, 4-0 monocryl for the skin, and I placed a 54 F Blake drain in the left axillary.  This was secured with a 2-0 nylon sutures.   I then irrigated the breast wound with saline. I infiltrated approximately 20 mL of 1/4 % local between the incisions.  I placed 6 clips to mark  biopsy cavity, at 12, 3, 6, and 9 o'clock. Two clips were placed on the pectoralis major.   I then closed all the wounds in layers using 3-0 Vicryl sutures for the deep layer. At the skin, I closed the incisions with a 5-0 Monocryl suture. The incisions were then painted with LiquiBand.  She had gauze place over the wounds and placed in a breast binder.   The patient tolerated the procedure well, was transported to the recovery room in good condition. Sponge and needle count were correct at the end of the case.   Final pathology is pending.    I will keep her overnight for observation.  Alphonsa Overall, MD, [facility] Surgery Pager: [phone] Office phone:  [phone]

## 2015-03-22 NOTE — Anesthesia Procedure Notes (Addendum)
Anesthesia Regional Block:  Pectoralis block  Pre-Anesthetic Checklist: ,, timeout performed, Correct Patient, Correct Site, Correct Laterality, Correct Procedure, Correct Position, site marked, Risks and benefits discussed,  Surgical consent,  Pre-op evaluation,  At surgeon's request and post-op pain management  Laterality: Left  Prep: chloraprep       Needles:  Injection technique: Single-shot  Needle Type: Echogenic Needle     Needle Length: 9cm 9 cm Needle Gauge: 21 and 21 G    Additional Needles:  Procedures: ultrasound guided (picture in chart) Pectoralis block Narrative:  Injection made incrementally with aspirations every 5 mL.  Performed by: Personally   Additional Notes: Patient tolerated the procedure well without complications   Procedure Name: Intubation Date/Time: 03/22/2015 10:50 AM Performed by: Raphael Gibney T Pre-anesthesia Checklist: Patient identified, Emergency Drugs available, Suction available, Patient being monitored and Timeout performed Patient Re-evaluated:Patient Re-evaluated prior to inductionOxygen Delivery Method: Circle system utilized and Simple face mask Preoxygenation: Pre-oxygenation with 100% oxygen Intubation Type: IV induction Ventilation: Mask ventilation without difficulty Laryngoscope Size: Miller and 2 Grade View: Grade I Tube type: Oral Tube size: 7.5 mm Number of attempts: 1 Airway Equipment and Method: Patient positioned with wedge pillow and Stylet Placement Confirmation: ETT inserted through vocal cords under direct vision,  positive ETCO2 and breath sounds checked- equal and bilateral Secured at: 21 cm Tube secured with: Tape Dental Injury: Teeth and Oropharynx as per pre-operative assessment

## 2015-03-22 NOTE — Anesthesia Postprocedure Evaluation (Signed)
Anesthesia Post Note  Patient: Zenya E Sobol  Procedure(s) Performed: Procedure(s) (LRB): BREAST LUMPECTOMY WITH RADIOACTIVE SEED AND AXILLARY LYMPH NODE DISSECTION (Left)  Patient location during evaluation: PACU Anesthesia Type: General Level of consciousness: awake and alert Pain management: pain level controlled Vital Signs Assessment: post-procedure vital signs reviewed and stable Respiratory status: spontaneous breathing, nonlabored ventilation, respiratory function stable and patient connected to nasal cannula oxygen Cardiovascular status: blood pressure returned to baseline and stable Postop Assessment: no signs of nausea or vomiting Anesthetic complications: no    Last Vitals:  Filed Vitals:   03/22/15 1415 03/22/15 1430  BP: 157/83 138/69  Pulse: 52 52  Temp:    Resp: 17 18    Last Pain:  Filed Vitals:   03/22/15 1435  PainSc: 4                  Maleeka Sabatino S

## 2015-03-22 NOTE — Transfer of Care (Signed)
Immediate Anesthesia Transfer of Care Note  Patient: [name]  Procedure(s) Performed: Procedure(s) with comments: BREAST LUMPECTOMY WITH RADIOACTIVE SEED AND AXILLARY LYMPH NODE DISSECTION (Left) - Left axilla and Lyph node excision  Patient Location: PACU  Anesthesia Type:GA combined with regional for post-op pain  Level of Consciousness: awake, alert  and oriented  Airway & Oxygen Therapy: Patient Spontanous Breathing and Patient connected to nasal cannula oxygen  Post-op Assessment: Report given to RN and Post -op Vital signs reviewed and stable  Post vital signs: Reviewed and stable  Last Vitals:  Filed Vitals:   03/22/15 0855 03/22/15 1322  BP: 176/63 156/74  Pulse: 65 55  Temp: 36.8 C 36.4 C  Resp: 16 20    Complications: No apparent anesthesia complications

## 2015-03-23 ENCOUNTER — Encounter (HOSPITAL_COMMUNITY): Payer: Self-pay | Admitting: Surgery

## 2015-03-23 DIAGNOSIS — C50312 Malignant neoplasm of lower-inner quadrant of left female breast: Secondary | ICD-10-CM | POA: Diagnosis not present

## 2015-03-23 NOTE — Progress Notes (Signed)
Discharge instructions gone over with patient and son. Taught pt. How to empty and record JP drain. Provided pink bag and JP drain information, along with recording sheet. Discussed follow up appointments, medications, and restrictions with patient. Patient states no questions or complaints. Patient discharged successfully.

## 2015-03-23 NOTE — Discharge Summary (Signed)
Physician Discharge Summary  Patient ID:  [name]  MRN: [id number]  DOB/AGE: 1934-09-11 81 y.o.  Admit date: 03/22/2015 Discharge date: 03/23/2015  Discharge Diagnoses:  1 CANCER OF LEFT BREAST, STAGE 2 (C50.912) Story: She had a mammogram on 01/24/2015 which showed a 1.7 cm mass in left breast the 8:30 o'clock. She underwent left breast biopsy on 02/07/2015 ([id number]) - IDC, ER - 95%, PR 60%, Ki67 - 15%, Her2Neu - negative. Lymph node is positive Oncology - Gud[facility]  Final path pending  2. COPD/emphysema  quit smoking in 2001  On 3 inhalers - sees Dr. Keturah Barre 3. Stroke 2013 Left sided weakness/change in gait 4. Cholecystectomy 6. Hemorrhoidectomy - 2004 - [place] 7. Right hip pain  Not seeing ortho 8. On Xarelto for stroke    Active Problems:   Breast cancer, stage 2 ([place])   Operation: Procedure(s): Left BREAST LUMPECTOMY WITH RADIOACTIVE SEED AND Left AXILLARY LYMPH NODE DISSECTION on 03/22/2015 - D. Lucia Gaskins  Discharged Condition: good  Hospital Course: [name] is an 81 y.o. female whose primary care physician is MOREIRA,ROY, MD and who was admitted 03/22/2015 with a chief complaint of Left breast cancer.   She was seen at the [facility] for treatment planning.  She was brought to the operating room on 03/22/2015 and underwent  Left BREAST LUMPECTOMY WITH RADIOACTIVE SEED AND Left AXILLARY LYMPH NODE DISSECTION .   The discharge instructions were reviewed with the patient.  Consults: None  Significant Diagnostic Studies: Results for orders placed or performed during the hospital encounter of 03/22/15  PT- INR at PAT visit (Pre-admission Testing)  Result Value Ref Range   Prothrombin Time 15.6 (H) 11.6 - 15.2 seconds   INR 1.22 0.00 - 1.49    Mm Breast Surgical Specimen  03/22/2015  CLINICAL DATA:  Specimen radiograph status post left breast lumpectomy. EXAM: SPECIMEN RADIOGRAPH  OF THE LEFT BREAST COMPARISON:  Previous exam(s). FINDINGS: Status post excision of the left breast. The radioactive seed and biopsy marker clip are present, completely intact, and were marked for pathology. These findings were communicated with the OR at 11:37 a.m. on 03/22/2015. IMPRESSION: Specimen radiograph of the left breast. Electronically Signed   By: Ammie Ferrier M.D.   On: 03/22/2015 11:38   Mm Lt Radioactive Seed Loc Mammo Guide  03/21/2015  CLINICAL DATA:  Biopsy proven invasive ductal carcinoma and ductal carcinoma in-situ in the left breast. EXAM: MAMMOGRAPHIC GUIDED RADIOACTIVE SEED LOCALIZATION OF THE LEFT BREAST COMPARISON:  Previous exam(s). FINDINGS: Patient presents for radioactive seed localization prior to surgical excision. I met with the patient and we discussed the procedure of seed localization including benefits and alternatives. We discussed the high likelihood of a successful procedure. We discussed the risks of the procedure including infection, bleeding, tissue injury and further surgery. We discussed the low dose of radioactivity involved in the procedure. Informed, written consent was given. The usual time-out protocol was performed immediately prior to the procedure. Using mammographic guidance, sterile technique, 1% lidocaine and an I-125 radioactive seed, the ribbon shaped clip was localized using a medial to lateral approach. The follow-up mammogram images confirm the seed in the expected location and were marked for Dr. Lucia Gaskins. Follow-up survey of the patient confirms presence of the radioactive seed. Order number of I-125 seed:  [id number]. Total activity:  3.557 millicuries  Reference Date: 03/16/2015 The patient tolerated the procedure well and was released from the [place]. She was given instructions regarding seed removal. IMPRESSION: Radioactive seed  localization left breast. No apparent complications. Electronically Signed   By: Lillia Mountain M.D.   On:  03/21/2015 14:45    Discharge Exam:  Filed Vitals:   03/22/15 2136 03/23/15 0546  BP: 131/61 121/56  Pulse: 46 41  Temp: 98.9 F (37.2 C) 98.6 F (37 C)  Resp: 15 16    General: WN older WF who is alert and generally healthy appearing.  Lungs: Clear to auscultation and symmetric breath sounds. Heart:  RRR. No murmur or rub. Chest:  Left chest dressing dry.  Drain - 40 cc recorded last 24 hours  Discharge Medications:     Medication List    TAKE these medications        acetaminophen 325 MG tablet  Commonly known as:  TYLENOL  Take 650 mg by mouth every 6 (six) hours as needed. For pain/fever     amiodarone 200 MG tablet  Commonly known as:  PACERONE  Take 100 mg by mouth daily.     amLODipine 2.5 MG tablet  Commonly known as:  NORVASC  Take 2.5 mg by mouth daily.     atorvastatin 10 MG tablet  Commonly known as:  LIPITOR  Take 10 mg by mouth daily.     calcium carbonate 600 MG Tabs tablet  Commonly known as:  OS-CAL  Take 600 mg by mouth daily with breakfast.     furosemide 40 MG tablet  Commonly known as:  LASIX  Take 40 mg by mouth daily.     levothyroxine 137 MCG tablet  Commonly known as:  SYNTHROID, LEVOTHROID  Take 137 mcg by mouth daily before breakfast.     LORazepam 0.5 MG tablet  Commonly known as:  ATIVAN  Take 0.5 mg by mouth 2 (two) times daily as needed.     losartan 100 MG tablet  Commonly known as:  COZAAR  Take 100 mg by mouth daily.     magnesium oxide 400 MG tablet  Commonly known as:  MAG-OX  Take 400 mg by mouth 2 (two) times daily.     mometasone-formoterol 100-5 MCG/ACT Aero  Commonly known as:  DULERA  Inhale 2 puffs into the lungs 2 (two) times daily.     multivitamin tablet  Take 1 tablet by mouth daily.     PROAIR HFA 108 (90 Base) MCG/ACT inhaler  Generic drug:  albuterol  Inhale 2 puffs into the lungs every 4 (four) hours as needed. For shortness of breath     rivaroxaban 20 MG Tabs tablet  Commonly known as:   XARELTO  Take 1 tablet (20 mg total) by mouth daily with supper.     spironolactone 25 MG tablet  Commonly known as:  ALDACTONE  Take 25 mg by mouth daily.     tiotropium 18 MCG inhalation capsule  Commonly known as:  SPIRIVA  Place 1 capsule (18 mcg total) into inhaler and inhale daily.     vitamin B-12 1000 MCG tablet  Commonly known as:  CYANOCOBALAMIN  Take 1,000 mcg by mouth daily.        Disposition: 01-Home or Self Care      Discharge Instructions    Diet - low sodium heart healthy    Complete by:  As directed      Increase activity slowly    Complete by:  As directed           Activity:  Driving - May drive in 5 or 6 days, if doing well  Lifting - No lifting more than 15 pounds for 1 week, then no limit  Wound Care:   Empty drain and record amount of drainage.  Bring that record with you to the office.       You may remove bandages and start showering this weekend.  Replace gauze around the drain.  Diet:  As tolerated  Follow up appointment:  Call Dr. Pollie Friar office [facility]) at [phone] for an appointment in 1 week.  Medications and dosages:  Resume your home medications.   Signed: Alphonsa Overall, M.D., [facility] Office:  [phone]  03/23/2015, 8:41 AM

## 2015-03-23 NOTE — Discharge Instructions (Signed)
[facility] - DISCHARGE INSTRUCTIONS TO PATIENT  Activity:  Driving - May drive in 5 or 6 days, if doing well   Lifting - No lifting more than 15 pounds for 1 week, then no limit  Wound Care:   Empty drain and record amount of drainage.  Bring that record with you to the office.       You may remove bandages and start showering this weekend.  Replace gauze around the drain.  Diet:  As tolerated  Follow up appointment:  Call Dr. Pollie Friar office [facility]) at [phone] for an appointment in 1 week.  Medications and dosages:  Resume your home medications.       Restart Xarelto on Friday.   Call Dr. Lucia Gaskins or his office  [phone]) if you have:  Temperature greater than 100.4,  Persistent nausea and vomiting,  Severe uncontrolled pain,  Redness, tenderness, or signs of infection (pain, swelling, redness, odor or green/yellow discharge around the site),  Difficulty breathing, headache or visual disturbances,  Any other questions or concerns you may have after discharge.  In an emergency, call 911 or go to an Emergency Department at a nearby hospital.

## 2015-03-25 NOTE — Progress Notes (Signed)
Location of Breast Cancer: Breast cancer of lower-inner quadrant of left female breast   Histology per Pathology Report:   03/22/15  Diagnosis 1. Breast, lumpectomy, Left - INVASIVE AND IN SITU LOBULAR CARCINOMA, 2 CM. - LYMPHOVASCULAR INVOLVEMENT BY TUMOR. - POSTERIOR AND SUPERIOR MARGINS BROADLY INVOLVED BY TUMOR. - LATERAL AND ANTERIOR MARGINS FOCALLY INVOLVED BY TUMOR. - SEE ONCOLOGY TABLE AND COMMENT. 2. Breast, excision, Left new superior margin - INVASIVE AND IN SITU LOBULAR CARCINOMA WITH MULTIPLE FOCI LESS THAN 0.1 CM FROM NEW SUPERIOR MARGIN. 3. Breast, excision, Left , new lateral margin - INVASIVE AND IN SITU LOBULAR CARCINOMA FOCALLY INVOLVING NEW LATERAL MARGIN AND MULTIPLE FOCI LESS THAN 0.1 CM FROM NEW LATERAL MARGIN. 4. Lymph nodes, regional resection, Left axilla & contents - METASTATIC CARCINOMA IN TWELVE OF TWELVE LYMPH NODES (12/12). - EXTRACAPSULAR TUMOR EXTENSION.  02/07/15 Diagnosis 1. Breast, left, needle core biopsy, 8:00 o'clock - INVASIVE DUCTAL CARCINOMA. - DUCTAL CARCINOMA IN SITU. - SEE COMMENT. 2. Lymph node, needle/core biopsy, left axilla - METASTATIC CARCINOMA IN 1 OF 1 LYMPH NODE (1/1). - EXTRACAPSULAR EXTENSION IS IDENTIFIED.  Receptor Status: ER(95%), PR (60%), Her2-neu (negative)  Did patient present with symptoms (if so, please note symptoms) or was this found on screening mammography?: mammogram  Past/Anticipated interventions by surgeon, if any: 03/22/15 -Procedure: BREAST LUMPECTOMY WITH RADIOACTIVE SEED AND AXILLARY LYMPH NODE DISSECTION;  Surgeon: Alphonsa Overall, MD;  Location: [facility];  Service: General;  Laterality: Left;  Left axilla and Lyph node excision   Past/Anticipated interventions by medical oncology, if any: per Dr. Lindi Adie, the plans if for "breast conserving surgery + axillary lymph node dissection (since she has clinically enlarged lymph nodes). Followed by adjuvant radiation. Followed by antiestrogen therapy."  Lymphedema  issues, if any:     Pain issues, if any:     OB/GYN history: She menarched at early age of 83 and went to menopause at age 49 She had 3 pregnancies, her first child was born at age 35.  She has received birth control pills for approximately 40 years. She was never exposed to fertility medications or hormone replacement therapy. She has no family history of Breast/GYN/GI cancer.  SAFETY ISSUES:  Prior radiation?   Pacemaker/ICD?   Possible current pregnancy?no  Is the patient on methotrexate?   Current Complaints / other details:      Jacqulyn Liner, RN 03/25/2015,9:50 AM

## 2015-03-29 ENCOUNTER — Telehealth: Payer: Self-pay | Admitting: Hematology and Oncology

## 2015-03-29 ENCOUNTER — Ambulatory Visit (HOSPITAL_BASED_OUTPATIENT_CLINIC_OR_DEPARTMENT_OTHER): Payer: Medicare Other | Admitting: Hematology and Oncology

## 2015-03-29 ENCOUNTER — Encounter: Payer: Self-pay | Admitting: Hematology and Oncology

## 2015-03-29 VITALS — BP 147/82 | HR 67 | Temp 97.5°F | Resp 18 | Ht 63.0 in | Wt 151.0 lb

## 2015-03-29 DIAGNOSIS — C50312 Malignant neoplasm of lower-inner quadrant of left female breast: Secondary | ICD-10-CM

## 2015-03-29 NOTE — Assessment & Plan Note (Signed)
Left lumpectomy 03/22/2015: Invasive and in situ lobular carcinoma grade 2, 6 cm, LVI present, posterior lateral and anterior margins broadly involved, 12/12 LN positive with ECE, ER 95%, PR 60%, HER-2 neg ratio 1.31, Ki-67 15%, T3 N3 (stage 3C)  Pathology counseling: I discussed the final pathology report of the patient provided  a copy of this report. I discussed the margins as well as lymph node surgeries. We also discussed the final staging along with previously performed ER/PR and HER-2/neu testing. I discussed the multiple positive margins as well as the large number of lymph nodes that were involved in the final staging.  Recommendation: 1. Patient will need additional surgery to clear the margins. The final tumor appeared to be much larger than what was initially felt to be. 2. She is not a candidate for systemic chemotherapy. 3. She will need adjuvant radiation followed by adjuvant antiestrogen therapy.

## 2015-03-29 NOTE — Telephone Encounter (Signed)
Appointments made and avs printed for patient °

## 2015-03-29 NOTE — Addendum Note (Signed)
Addended by: Prentiss Bells on: 03/29/2015 05:24 PM   Modules accepted: Orders

## 2015-03-29 NOTE — Progress Notes (Signed)
Patient Care Team: [name], MD as PCP - General (Internal Medicine) Alphonsa Overall, MD as Consulting Physician (General Surgery) Nicholas Lose, MD as Consulting Physician (Hematology and Oncology) Gery Pray, MD as Consulting Physician (Radiation Oncology) Sylvan Cheese, NP as Nurse Practitioner (Hematology and Oncology)  DIAGNOSIS: Breast cancer of lower-inner quadrant of left female breast [facility])   Staging form: Breast, AJCC 7th Edition     Clinical stage from 02/16/2015: Stage IIA (T1c, N1, M0) - Unsigned  SUMMARY OF ONCOLOGIC HISTORY:   Breast cancer of lower-inner quadrant of left female breast ([place])   01/24/2015 Mammogram Area of palpable concern left breast lower inner quadrant focal skin thickening with architectural distortion, ultrasound showed 8:30: 1.1 x 1.7 x 0.9 cm abnormality, left axilla to pathologic axillary lymph nodes cortical thickening 1.3 cm, 9 mm   02/07/2015 Initial Diagnosis Left breast biopsy 8:00: IDC with DCIS grade 2-3, ER 95%, PR 60%, Ki-67 15%, HER-2 negative ratio 1.31; left axillary lymph node biopsy positive for metastatic carcinoma with extracapsular extension   03/22/2015 Surgery Left lumpectomy: Invasive and in situ lobular carcinoma grade 2, 2 cm, LVI present, posterior lateral and anterior margins broadly involved, 12/12 LN positive with ECE, ER 95%, PR 60%, HER-2 neg ratio 1.31, Ki-67 15%, T3 N3 (stage 3C)   CHIEF COMPLIANT:  Follow-up after recent left lumpectomy  INTERVAL HISTORY: [name] is a  81 year old with above-mentioned history of left breast cancer treated with lumpectomy an x-ray lymph node dissection. She had 12 positive lymph nodes with extracapsular extension. The tumor was broadly involving margins. She is recovering fairly well from the surgery standpoint.  REVIEW OF SYSTEMS:   Constitutional: Denies fevers, chills or abnormal weight loss Eyes: Denies blurriness of vision Ears, nose, mouth, throat, and face: Denies  mucositis or sore throat Respiratory: Denies cough, dyspnea or wheezes Cardiovascular: Denies palpitation, chest discomfort Gastrointestinal:  Denies nausea, heartburn or change in bowel habits Skin: Denies abnormal skin rashes Lymphatics: Denies new lymphadenopathy or easy bruising Neurological:Denies numbness, tingling or new weaknesses Behavioral/Psych: Mood is stable, no new changes  Extremities: No lower extremity edema All other systems were reviewed with the patient and are negative.  I have reviewed the past medical history, past surgical history, social history and family history with the patient and they are unchanged from previous note.  ALLERGIES:  is allergic to codeine and penicillins.  MEDICATIONS:  Current Outpatient Prescriptions  Medication Sig Dispense Refill  . acetaminophen (TYLENOL) 325 MG tablet Take 650 mg by mouth every 6 (six) hours as needed. For pain/fever    . albuterol (PROAIR HFA) 108 (90 BASE) MCG/ACT inhaler Inhale 2 puffs into the lungs every 4 (four) hours as needed. For shortness of breath    . amiodarone (PACERONE) 200 MG tablet Take 100 mg by mouth daily.     [name] amLODipine (NORVASC) 2.5 MG tablet Take 2.5 mg by mouth daily.    [name] atorvastatin (LIPITOR) 10 MG tablet Take 10 mg by mouth daily.   2  . calcium carbonate (OS-CAL) 600 MG TABS tablet Take 600 mg by mouth daily with breakfast.    . furosemide (LASIX) 40 MG tablet Take 40 mg by mouth daily.     [name] levothyroxine (SYNTHROID, LEVOTHROID) 137 MCG tablet Take 137 mcg by mouth daily before breakfast.    . LORazepam (ATIVAN) 0.5 MG tablet Take 0.5 mg by mouth 2 (two) times daily as needed.      [name] losartan (COZAAR) 100 MG tablet  Take 100 mg by mouth daily.    . magnesium oxide (MAG-OX) 400 MG tablet Take 400 mg by mouth 2 (two) times daily.      . mometasone-formoterol (DULERA) 100-5 MCG/ACT AERO Inhale 2 puffs into the lungs 2 (two) times daily. 1 Inhaler 6  . Multiple Vitamin (MULTIVITAMIN) tablet  Take 1 tablet by mouth daily.    . Rivaroxaban (XARELTO) 20 MG TABS Take 1 tablet (20 mg total) by mouth daily with supper. 30 tablet 2  . spironolactone (ALDACTONE) 25 MG tablet Take 25 mg by mouth daily.   2  . tiotropium (SPIRIVA) 18 MCG inhalation capsule Place 1 capsule (18 mcg total) into inhaler and inhale daily. 10 capsule 0  . vitamin B-12 (CYANOCOBALAMIN) 1000 MCG tablet Take 1,000 mcg by mouth daily.    . [DISCONTINUED] digoxin (LANOXIN) 0.125 MG tablet Take 1 tablet by mouth Daily.     No current facility-administered medications for this visit.    PHYSICAL EXAMINATION: ECOG PERFORMANCE STATUS: 2 - Symptomatic, <50% confined to bed  Filed Vitals:   03/29/15 1433  BP: 147/82  Pulse: 67  Temp: 97.5 F (36.4 C)  Resp: 18   Filed Weights   03/29/15 1433  Weight: 151 lb (68.493 kg)    GENERAL:alert, no distress and comfortable SKIN: skin color, texture, turgor are normal, no rashes or significant lesions EYES: normal, Conjunctiva are pink and non-injected, sclera clear OROPHARYNX:no exudate, no erythema and lips, buccal mucosa, and tongue normal  NECK: supple, thyroid normal size, non-tender, without nodularity LYMPH:  no palpable lymphadenopathy in the cervical, axillary or inguinal LUNGS: clear to auscultation and percussion with normal breathing effort HEART: regular rate & rhythm and no murmurs and no lower extremity edema ABDOMEN:abdomen soft, non-tender and normal bowel sounds MUSCULOSKELETAL:no cyanosis of digits and no clubbing  NEURO: alert & oriented x 3 with fluent speech, no focal motor/sensory deficits EXTREMITIES: No lower extremity edema  LABORATORY DATA:  I have reviewed the data as listed   Chemistry      Component Value Date/Time   NA 140 03/16/2015 1330   NA 137 02/16/2015 0854   K 4.4 03/16/2015 1330   K 3.9 02/16/2015 0854   CL 102 03/16/2015 1330   CO2 27 03/16/2015 1330   CO2 24 02/16/2015 0854   BUN 19 03/16/2015 1330   BUN 20.3  02/16/2015 0854   CREATININE 1.25* 03/16/2015 1330   CREATININE 1.0 02/16/2015 0854      Component Value Date/Time   CALCIUM 9.5 03/16/2015 1330   CALCIUM 9.5 02/16/2015 0854   ALKPHOS 50 03/16/2015 1330   ALKPHOS 62 02/16/2015 0854   AST 25 03/16/2015 1330   AST 17 02/16/2015 0854   ALT 22 03/16/2015 1330   ALT 15 02/16/2015 0854   BILITOT 0.4 03/16/2015 1330   BILITOT 0.32 02/16/2015 0854       Lab Results  Component Value Date   WBC 10.7* 03/16/2015   HGB 13.7 03/16/2015   HCT 41.8 03/16/2015   MCV 99.5 03/16/2015   PLT 322 03/16/2015   NEUTROABS 8.3* 03/16/2015   ASSESSMENT & PLAN:  Breast cancer of lower-inner quadrant of left female breast ([facility]) Left lumpectomy 03/22/2015: Invasive and in situ lobular carcinoma grade 2, 6 cm, LVI present, posterior lateral and anterior margins broadly involved, 12/12 LN positive with ECE, ER 95%, PR 60%, HER-2 neg ratio 1.31, Ki-67 15%, T3 N3 (stage 3C)  Pathology counseling: I discussed the final pathology report of the patient provided    a copy of this report. I discussed the margins as well as lymph node surgeries. We also discussed the final staging along with previously performed ER/PR and HER-2/neu testing. I discussed the multiple positive margins as well as the large number of lymph nodes that were involved in the final staging.  Recommendation: 1. Patient will need additional surgery to clear the margins. The final tumor appeared to be much larger than what was initially felt to be. 2. She is not a candidate for systemic chemotherapy (patient's wishes and her elderly age). 3. She will need adjuvant radiation 4. followed by adjuvant antiestrogen therapy.  I will obtain a CT of the chest abdomen and pelvis and a bone scan to rule out metastatic disease. She will be presented in the tumor board and final decision will be taken. Orders Placed This Encounter  Procedures  . CT Abdomen Pelvis W Contrast    Standing Status: Future       Number of Occurrences:      Standing Expiration Date: 03/28/2016    Order Specific Question:  If indicated for the ordered procedure, I authorize the administration of contrast media per Radiology protocol    Answer:  Yes    Order Specific Question:  Reason for Exam (SYMPTOM  OR DIAGNOSIS REQUIRED)    Answer:  Stage 3 c breast cancer staging    Order Specific Question:  Preferred imaging location?    Answer:  [facility]  . CT Chest W Contrast    Standing Status: Future     Number of Occurrences:      Standing Expiration Date: 03/28/2016    Order Specific Question:  If indicated for the ordered procedure, I authorize the administration of contrast media per Radiology protocol    Answer:  Yes    Order Specific Question:  Reason for Exam (SYMPTOM  OR DIAGNOSIS REQUIRED)    Answer:  Stage 3 c breast cancer staging    Order Specific Question:  Preferred imaging location?    Answer:  [facility]  . NM Bone Scan Whole Body    Standing Status: Future     Number of Occurrences:      Standing Expiration Date: 03/28/2016    Order Specific Question:  Reason for Exam (SYMPTOM  OR DIAGNOSIS REQUIRED)    Answer:  Stage 3 c breast cancer staging    Order Specific Question:  Preferred imaging location?    Answer:  [facility]    Order Specific Question:  If indicated for the ordered procedure, I authorize the administration of a radiopharmaceutical per Radiology protocol    Answer:  Yes   The patient has a good understanding of the overall plan. she agrees with it. she will call with any problems that may develop before the next visit here.   Gudena, Vinay K, MD 03/29/2015      

## 2015-03-29 NOTE — Progress Notes (Signed)
Unable to get in to exam room prior to MD.  No assessment performed.  

## 2015-03-30 ENCOUNTER — Other Ambulatory Visit: Payer: Self-pay

## 2015-03-30 ENCOUNTER — Ambulatory Visit
Admission: RE | Admit: 2015-03-30 | Discharge: 2015-03-30 | Disposition: A | Discharge status: Home / Self Care | Payer: Medicare Other | Source: Ambulatory Visit | Attending: Radiation Oncology | Admitting: Radiation Oncology

## 2015-03-30 ENCOUNTER — Ambulatory Visit: Payer: Medicare Other | Admitting: Radiation Oncology

## 2015-03-30 ENCOUNTER — Inpatient Hospital Stay: Admission: RE | Admit: 2015-03-30 | Payer: Medicare Other | Source: Ambulatory Visit | Admitting: Radiation Oncology

## 2015-03-30 ENCOUNTER — Other Ambulatory Visit: Payer: Self-pay | Admitting: Hematology and Oncology

## 2015-03-30 ENCOUNTER — Telehealth: Payer: Self-pay | Admitting: *Deleted

## 2015-03-30 ENCOUNTER — Ambulatory Visit: Payer: Medicare Other

## 2015-03-30 DIAGNOSIS — E039 Hypothyroidism, unspecified: Secondary | ICD-10-CM | POA: Insufficient documentation

## 2015-03-30 DIAGNOSIS — Z87891 Personal history of nicotine dependence: Secondary | ICD-10-CM | POA: Insufficient documentation

## 2015-03-30 DIAGNOSIS — G629 Polyneuropathy, unspecified: Secondary | ICD-10-CM | POA: Insufficient documentation

## 2015-03-30 DIAGNOSIS — Z86010 Personal history of colonic polyps: Secondary | ICD-10-CM | POA: Insufficient documentation

## 2015-03-30 DIAGNOSIS — C50312 Malignant neoplasm of lower-inner quadrant of left female breast: Secondary | ICD-10-CM

## 2015-03-30 DIAGNOSIS — Z510 Encounter for antineoplastic radiation therapy: Secondary | ICD-10-CM | POA: Insufficient documentation

## 2015-03-30 DIAGNOSIS — E785 Hyperlipidemia, unspecified: Secondary | ICD-10-CM | POA: Insufficient documentation

## 2015-03-30 DIAGNOSIS — F419 Anxiety disorder, unspecified: Secondary | ICD-10-CM | POA: Insufficient documentation

## 2015-03-30 DIAGNOSIS — F329 Major depressive disorder, single episode, unspecified: Secondary | ICD-10-CM | POA: Insufficient documentation

## 2015-03-30 DIAGNOSIS — I4891 Unspecified atrial fibrillation: Secondary | ICD-10-CM | POA: Insufficient documentation

## 2015-03-30 DIAGNOSIS — Z170 Estrogen receptor positive status [ER+]: Secondary | ICD-10-CM | POA: Insufficient documentation

## 2015-03-30 DIAGNOSIS — I10 Essential (primary) hypertension: Secondary | ICD-10-CM | POA: Insufficient documentation

## 2015-03-30 DIAGNOSIS — J449 Chronic obstructive pulmonary disease, unspecified: Secondary | ICD-10-CM | POA: Insufficient documentation

## 2015-03-30 DIAGNOSIS — R0600 Dyspnea, unspecified: Secondary | ICD-10-CM | POA: Insufficient documentation

## 2015-03-30 DIAGNOSIS — M1990 Unspecified osteoarthritis, unspecified site: Secondary | ICD-10-CM | POA: Insufficient documentation

## 2015-03-30 DIAGNOSIS — M797 Fibromyalgia: Secondary | ICD-10-CM | POA: Insufficient documentation

## 2015-03-30 DIAGNOSIS — J439 Emphysema, unspecified: Secondary | ICD-10-CM | POA: Insufficient documentation

## 2015-03-30 NOTE — Telephone Encounter (Signed)
Central scheduling called reporting "orders are not e-signed for tomorrow's radiology tests.  There should be a check or a pin beside orders when signed and this is not there."  Collaborative notified.

## 2015-03-30 NOTE — Telephone Encounter (Signed)
Scheduled and confirmed appt for CT/Bone scan on 03/31/15

## 2015-03-31 ENCOUNTER — Ambulatory Visit (HOSPITAL_COMMUNITY): Payer: Medicare Other

## 2015-03-31 ENCOUNTER — Ambulatory Visit (HOSPITAL_COMMUNITY)
Admission: RE | Admit: 2015-03-31 | Discharge: 2015-03-31 | Disposition: A | Discharge status: Home / Self Care | Payer: Medicare Other | Source: Ambulatory Visit | Attending: Hematology and Oncology | Admitting: Hematology and Oncology

## 2015-03-31 ENCOUNTER — Ambulatory Visit (HOSPITAL_COMMUNITY): Admission: RE | Admit: 2015-03-31 | Payer: Medicare Other | Source: Ambulatory Visit

## 2015-03-31 DIAGNOSIS — K5730 Diverticulosis of large intestine without perforation or abscess without bleeding: Secondary | ICD-10-CM | POA: Insufficient documentation

## 2015-03-31 DIAGNOSIS — C778 Secondary and unspecified malignant neoplasm of lymph nodes of multiple regions: Secondary | ICD-10-CM | POA: Insufficient documentation

## 2015-03-31 DIAGNOSIS — Z9049 Acquired absence of other specified parts of digestive tract: Secondary | ICD-10-CM | POA: Diagnosis not present

## 2015-03-31 DIAGNOSIS — I700 Atherosclerosis of aorta: Secondary | ICD-10-CM | POA: Insufficient documentation

## 2015-03-31 DIAGNOSIS — Z8781 Personal history of (healed) traumatic fracture: Secondary | ICD-10-CM | POA: Diagnosis not present

## 2015-03-31 DIAGNOSIS — C50312 Malignant neoplasm of lower-inner quadrant of left female breast: Secondary | ICD-10-CM | POA: Insufficient documentation

## 2015-03-31 DIAGNOSIS — R918 Other nonspecific abnormal finding of lung field: Secondary | ICD-10-CM | POA: Diagnosis not present

## 2015-03-31 MED ORDER — TECHNETIUM TC 99M MEDRONATE IV KIT
25.7000 | PACK | Freq: Once | INTRAVENOUS | Status: AC | PRN
  Administered 2015-03-31: 25.7 via INTRAVENOUS

## 2015-03-31 MED ORDER — IOHEXOL 300 MG/ML  SOLN
25.0000 mL | INTRAMUSCULAR | Status: AC
  Administered 2015-03-31: 25 mL via ORAL

## 2015-03-31 MED ORDER — IOHEXOL 300 MG/ML  SOLN
100.0000 mL | Freq: Once | INTRAMUSCULAR | Status: AC | PRN
  Administered 2015-03-31: 100 mL via INTRAVENOUS

## 2015-04-05 ENCOUNTER — Ambulatory Visit (HOSPITAL_BASED_OUTPATIENT_CLINIC_OR_DEPARTMENT_OTHER): Payer: Medicare Other | Admitting: Hematology and Oncology

## 2015-04-05 ENCOUNTER — Encounter: Payer: Self-pay | Admitting: Hematology and Oncology

## 2015-04-05 ENCOUNTER — Telehealth: Payer: Self-pay | Admitting: Hematology and Oncology

## 2015-04-05 VITALS — BP 134/63 | HR 68 | Temp 98.3°F | Resp 18 | Ht 63.0 in | Wt 150.3 lb

## 2015-04-05 DIAGNOSIS — C50312 Malignant neoplasm of lower-inner quadrant of left female breast: Secondary | ICD-10-CM

## 2015-04-05 MED ORDER — ANASTROZOLE 1 MG PO TABS: 1.0000 mg | ORAL_TABLET | Freq: Every day | ORAL | Status: DC

## 2015-04-05 NOTE — Assessment & Plan Note (Addendum)
Left lumpectomy 03/22/2015: Invasive and in situ lobular carcinoma grade 2, 6 cm, LVI present, posterior lateral and anterior margins broadly involved, 12/12 LN positive with ECE, ER 95%, PR 60%, HER-2 neg ratio 1.31, Ki-67 15%, T3 N3 (stage 3C)  Recommendation: 1. Patient will need additional surgery to clear the margins. The final tumor appeared to be much larger than what was initially felt to be. 2. She is not a candidate for systemic chemotherapy (patient's wishes and her elderly age). 3. She will need adjuvant radiation 4. followed by adjuvant antiestrogen therapy. ---------------------------------------------------------------------------------------------------------------------------------------------------------------------- CT of the chest abdomen and pelvis and a bone scan  03/31/2015:  Postsurgical changes suspected residual enhancing soft tissue in the anterior/lateral left breast worrisome for residual tumor , associated left supraclavicular, subpectoral and left axillary nodal metastases , additional small thoracic lymph nodes are indeterminate , scattered small pulmonary nodules measuring up to 6 mm indeterminate Bone scan: No bone metastases  Recommendation based on my discussion with Dr. Lucia Gaskins 1.  Additional surgeries will be needed for the positive margins 2.  Adjuvant radiation therapy with Dr. Sondra Barges could include the supraclavicular, subpectoral and left axillary lymph nodes 3.  Ideally patient will benefit from systemic chemotherapy but given her preference to not receive chemotherapy , we will not need any port placement during the surgery.   Return to clinic after  Radiation therapy to start antiestrogen treatment.

## 2015-04-05 NOTE — Telephone Encounter (Signed)
Called Patient and notified Patient of upcoming Scheduled Appointment.       [facility].

## 2015-04-05 NOTE — Progress Notes (Signed)
Patient Care Team: [name], MD as PCP - General (Internal Medicine) Alphonsa Overall, MD as Consulting Physician (General Surgery) Nicholas Lose, MD as Consulting Physician (Hematology and Oncology) Gery Pray, MD as Consulting Physician (Radiation Oncology) Sylvan Cheese, NP as Nurse Practitioner (Hematology and Oncology)  DIAGNOSIS: Breast cancer of lower-inner quadrant of left female breast [facility])   Staging form: Breast, AJCC 7th Edition     Clinical stage from 02/16/2015: Stage IIA (T1c, N1, M0) - Unsigned  SUMMARY OF ONCOLOGIC HISTORY:   Breast cancer of lower-inner quadrant of left female breast ([place])   01/24/2015 Mammogram Area of palpable concern left breast lower inner quadrant focal skin thickening with architectural distortion, ultrasound showed 8:30: 1.1 x 1.7 x 0.9 cm abnormality, left axilla to pathologic axillary lymph nodes cortical thickening 1.3 cm, 9 mm   02/07/2015 Initial Diagnosis Left breast biopsy 8:00: IDC with DCIS grade 2-3, ER 95%, PR 60%, Ki-67 15%, HER-2 negative ratio 1.31; left axillary lymph node biopsy positive for metastatic carcinoma with extracapsular extension   03/22/2015 Surgery Left lumpectomy: Invasive and in situ lobular carcinoma grade 2, 2 cm, LVI present, posterior lateral and anterior margins broadly involved, 12/12 LN positive with ECE, ER 95%, PR 60%, HER-2 neg ratio 1.31, Ki-67 15%, T3 N3 (stage 3C)   03/31/2015 Imaging CT CAP and bone scan Postsurgical changes suspected residual enhancing soft tissue in the anterior/lateral left breast worrisome for residual tumor , associated left supraclavicular, subpectoral and left axillary LN, small hilar LN, small lung nodules    CHIEF COMPLIANT:  Follow-up to discuss CT scans  INTERVAL HISTORY: [name] is a  81 year old lady with above-mentioned history of left breast cancer treated with lumpectomy and was found to have extensive lymphadenopathy on a CT scan. She is here today to discuss  the results of the scan. She has Mysliwiec-standing COPD. The CT scan suggested multiple left supraclavicular, subpectoral and left axillary lymph nodes as well as small hilar lymph nodes and several small lung nodules. She is accompanied today by her son to discuss the scan results. She is sore from prior surgery other than that she is feeling quite well. She is very anxious about all of these findings.  REVIEW OF SYSTEMS:   Constitutional: Denies fevers, chills or abnormal weight loss Eyes: Denies blurriness of vision Ears, nose, mouth, throat, and face: Denies mucositis or sore throat Respiratory: Denies cough, dyspnea or wheezes Cardiovascular: Denies palpitation, chest discomfort Gastrointestinal:  Denies nausea, heartburn or change in bowel habits Skin: Denies abnormal skin rashes Lymphatics: Denies new lymphadenopathy or easy bruising Neurological:Denies numbness, tingling or new weaknesses Behavioral/Psych: Mood is stable, no new changes  Extremities: No lower extremity edema Breast:  Soreness and swelling from recent surgery All other systems were reviewed with the patient and are negative.  I have reviewed the past medical history, past surgical history, social history and family history with the patient and they are unchanged from previous note.  ALLERGIES:  is allergic to codeine and penicillins.  MEDICATIONS:  Current Outpatient Prescriptions  Medication Sig Dispense Refill  . acetaminophen (TYLENOL) 325 MG tablet Take 650 mg by mouth every 6 (six) hours as needed. For pain/fever    . albuterol (PROAIR HFA) 108 (90 BASE) MCG/ACT inhaler Inhale 2 puffs into the lungs every 4 (four) hours as needed. For shortness of breath    . amiodarone (PACERONE) 200 MG tablet Take 100 mg by mouth daily.     [name] amLODipine (NORVASC) 2.5 MG  tablet Take 2.5 mg by mouth daily.    [name] anastrozole (ARIMIDEX) 1 MG tablet Take 1 tablet (1 mg total) by mouth daily. 90 tablet 3  . atorvastatin (LIPITOR) 10  MG tablet Take 10 mg by mouth daily.   2  . calcium carbonate (OS-CAL) 600 MG TABS tablet Take 600 mg by mouth daily with breakfast.    . furosemide (LASIX) 40 MG tablet Take 40 mg by mouth daily.     [name] levothyroxine (SYNTHROID, LEVOTHROID) 137 MCG tablet Take 137 mcg by mouth daily before breakfast.    . LORazepam (ATIVAN) 0.5 MG tablet Take 0.5 mg by mouth 2 (two) times daily as needed.      [name] losartan (COZAAR) 100 MG tablet Take 100 mg by mouth daily.    . magnesium oxide (MAG-OX) 400 MG tablet Take 400 mg by mouth 2 (two) times daily.      . mometasone-formoterol (DULERA) 100-5 MCG/ACT AERO Inhale 2 puffs into the lungs 2 (two) times daily. 1 Inhaler 6  . Multiple Vitamin (MULTIVITAMIN) tablet Take 1 tablet by mouth daily.    . Rivaroxaban (XARELTO) 20 MG TABS Take 1 tablet (20 mg total) by mouth daily with supper. 30 tablet 2  . spironolactone (ALDACTONE) 25 MG tablet Take 25 mg by mouth daily.   2  . tiotropium (SPIRIVA) 18 MCG inhalation capsule Place 1 capsule (18 mcg total) into inhaler and inhale daily. 10 capsule 0  . vitamin B-12 (CYANOCOBALAMIN) 1000 MCG tablet Take 1,000 mcg by mouth daily.    . [DISCONTINUED] digoxin (LANOXIN) 0.125 MG tablet Take 1 tablet by mouth Daily.     No current facility-administered medications for this visit.    PHYSICAL EXAMINATION: ECOG PERFORMANCE STATUS: 2 - Symptomatic, <50% confined to bed  Filed Vitals:   04/05/15 1521  BP: 134/63  Pulse: 68  Temp: 98.3 F (36.8 C)  Resp: 18   Filed Weights   04/05/15 1521  Weight: 150 lb 4.8 oz (68.176 kg)    GENERAL:alert, no distress and comfortable SKIN: skin color, texture, turgor are normal, no rashes or significant lesions EYES: normal, Conjunctiva are pink and non-injected, sclera clear OROPHARYNX:no exudate, no erythema and lips, buccal mucosa, and tongue normal  NECK: supple, thyroid normal size, non-tender, without nodularity LYMPH:  no palpable lymphadenopathy in the cervical,  axillary or inguinal LUNGS: clear to auscultation and percussion with normal breathing effort HEART: regular rate & rhythm and no murmurs and no lower extremity edema ABDOMEN:abdomen soft, non-tender and normal bowel sounds MUSCULOSKELETAL:no cyanosis of digits and no clubbing  NEURO: alert & oriented x 3 with fluent speech, no focal motor/sensory deficits EXTREMITIES: No lower extremity edema   LABORATORY DATA:  I have reviewed the data as listed   Chemistry      Component Value Date/Time   NA 140 03/16/2015 1330   NA 137 02/16/2015 0854   K 4.4 03/16/2015 1330   K 3.9 02/16/2015 0854   CL 102 03/16/2015 1330   CO2 27 03/16/2015 1330   CO2 24 02/16/2015 0854   BUN 19 03/16/2015 1330   BUN 20.3 02/16/2015 0854   CREATININE 1.25* 03/16/2015 1330   CREATININE 1.0 02/16/2015 0854      Component Value Date/Time   CALCIUM 9.5 03/16/2015 1330   CALCIUM 9.5 02/16/2015 0854   ALKPHOS 50 03/16/2015 1330   ALKPHOS 62 02/16/2015 0854   AST 25 03/16/2015 1330   AST 17 02/16/2015 0854   ALT 22 03/16/2015 1330  ALT 15 02/16/2015 0854   BILITOT 0.4 03/16/2015 1330   BILITOT 0.32 02/16/2015 0854       Lab Results  Component Value Date   WBC 10.7* 03/16/2015   HGB 13.7 03/16/2015   HCT 41.8 03/16/2015   MCV 99.5 03/16/2015   PLT 322 03/16/2015   NEUTROABS 8.3* 03/16/2015     ASSESSMENT & PLAN:  Breast cancer of lower-inner quadrant of left female breast ([place]) Left lumpectomy 03/22/2015: Invasive and in situ lobular carcinoma grade 2, 6 cm, LVI present, posterior lateral and anterior margins broadly involved, 12/12 LN positive with ECE, ER 95%, PR 60%, HER-2 neg ratio 1.31, Ki-67 15%, T3 N3 (stage 3C)  Recommendation: 1. Patient wmay need additional surgery to clear the margins. The final tumor appeared to be much larger than what was initially felt to be.  She also has supraclavicular, subpectoral, hilar lymph nodes that cannot be removed. 2. She is not a candidate for  systemic chemotherapy (patient's wishes and her elderly age). 3. She will need adjuvant radiation 4. followed by adjuvant antiestrogen therapy. -------------------------------------------------------------------------------------------------------------------------------------------------------------- CT of the chest abdomen and pelvis and a bone scan  03/31/2015:  Postsurgical changes suspected residual enhancing soft tissue in the anterior/lateral left breast worrisome for residual tumor , associated left supraclavicular, subpectoral and left axillary nodal metastases , additional small thoracic lymph nodes are indeterminate , scattered small pulmonary nodules measuring up to 6 mm indeterminate Bone scan: No bone metastases  Recommendation based on my discussion with Dr. Lucia Gaskins 1.  Additional surgeries may be needed for the positive margins vs XRT alone 2.  Adjuvant radiation therapy with Dr. Sondra Barges could include the supraclavicular, subpectoral and left axillary lymph nodes 3.  Ideally patient will benefit from systemic chemotherapy but given her preference to not receive chemotherapy , we will not need any port placement during the surgery.    Prognosis:I discussed with the patient that she is at a very very high risk for distant metastatic disease. We cannot remove the supraclavicular and subpectoral and hilar lymph nodes. It is for this reason we will be debating her case tomorrow in our tumor conference and make a final decision regarding the treatment course of action.   Plan:while we are waiting for some of these plans to be decided, I encouraged her to start antiestrogen therapy with anastrozole 1 mg daily starting today. She will stop anastrozole prior to surgery if that's arrived we will decide on and also before radiation. She will reinitiate antiestrogen therapy 2 weeks after radiation is complete.   Return to clinic in 3 months for follow-up.  No orders of the defined types were  placed in this encounter.   The patient has a good understanding of the overall plan. she agrees with it. she will call with any problems that may develop before the next visit here.   Rulon Eisenmenger, MD 04/05/2015

## 2015-04-15 NOTE — Progress Notes (Signed)
Location of Breast Cancer: Breast cancer of lower-inner quadrant of left female breast   Histology per Pathology Report:   03/22/15  Diagnosis 1. Breast, lumpectomy, Left - INVASIVE AND IN SITU LOBULAR CARCINOMA, 2 CM. - LYMPHOVASCULAR INVOLVEMENT BY TUMOR. - POSTERIOR AND SUPERIOR MARGINS BROADLY INVOLVED BY TUMOR. - LATERAL AND ANTERIOR MARGINS FOCALLY INVOLVED BY TUMOR. - SEE ONCOLOGY TABLE AND COMMENT. 2. Breast, excision, Left new superior margin - INVASIVE AND IN SITU LOBULAR CARCINOMA WITH MULTIPLE FOCI LESS THAN 0.1 CM FROM NEW SUPERIOR MARGIN. 3. Breast, excision, Left , new lateral margin - INVASIVE AND IN SITU LOBULAR CARCINOMA FOCALLY INVOLVING NEW LATERAL MARGIN AND MULTIPLE FOCI LESS THAN 0.1 CM FROM NEW LATERAL MARGIN. 4. Lymph nodes, regional resection, Left axilla & contents - METASTATIC CARCINOMA IN TWELVE OF TWELVE LYMPH NODES (12/12). - EXTRACAPSULAR TUMOR EXTENSION.  02/07/15 Diagnosis 1. Breast, left, needle core biopsy, 8:00 o'clock - INVASIVE DUCTAL CARCINOMA. - DUCTAL CARCINOMA IN SITU. - SEE COMMENT. 2. Lymph node, needle/core biopsy, left axilla - METASTATIC CARCINOMA IN 1 OF 1 LYMPH NODE (1/1). - EXTRACAPSULAR EXTENSION IS IDENTIFIED.  Receptor Status: ER(95%), PR (60%), Her2-neu (negative)  Did patient present with symptoms (if so, please note symptoms) or was this found on screening mammography?: patient palpated a lump  Past/Anticipated interventions by surgeon, if any: 03/22/15 -Procedure: BREAST LUMPECTOMY WITH RADIOACTIVE SEED AND AXILLARY LYMPH NODE DISSECTION; Surgeon: David Newman, MD; Location: [facility] OR; Service: General; Laterality: Left; Left axilla and Lyph node excision   Past/Anticipated interventions by medical oncology, if any: per Dr. Gudena, the plans if for "breast conserving surgery + axillary lymph node dissection (since she has clinically enlarged lymph nodes). Followed by adjuvant radiation. Followed by antiestrogen  therapy."  Lymphedema issues, if any: no  Pain issues, if any: no   OB/GYN history: She menarched at early age of 14 and went to menopause at age 42 She had 3 pregnancies, her first child was born at age 20. She has received birth control pills for approximately 40 years. She was never exposed to fertility medications or hormone replacement therapy.   SAFETY ISSUES:  Prior radiation? no  Pacemaker/ICD? no  Possible current pregnancy?no  Is the patient on methotrexate? no  Current Complaints / other details: Patient is here for her best friend.  She reports that she has been having anxiety and is taking ativan 0.5 mg bid.  She is wondering if she can take it more often.  BP 167/74 mmHg  Pulse 60  Temp(Src) 98.2 F (36.8 C) (Oral)  Resp 18  Ht 5' 3" (1.6 m)  Wt 152 lb 1.6 oz (68.992 kg)  BMI 26.95 kg/m2              

## 2015-04-20 ENCOUNTER — Encounter: Payer: Self-pay | Admitting: Radiation Oncology

## 2015-04-20 ENCOUNTER — Ambulatory Visit
Admission: RE | Admit: 2015-04-20 | Discharge: 2015-04-20 | Disposition: A | Discharge status: Home / Self Care | Payer: Medicare Other | Source: Ambulatory Visit | Attending: Radiation Oncology | Admitting: Radiation Oncology

## 2015-04-20 VITALS — BP 167/74 | HR 60 | Temp 98.2°F | Resp 18 | Ht 63.0 in | Wt 152.1 lb

## 2015-04-20 DIAGNOSIS — R0600 Dyspnea, unspecified: Secondary | ICD-10-CM | POA: Diagnosis not present

## 2015-04-20 DIAGNOSIS — G629 Polyneuropathy, unspecified: Secondary | ICD-10-CM | POA: Diagnosis not present

## 2015-04-20 DIAGNOSIS — Z87891 Personal history of nicotine dependence: Secondary | ICD-10-CM | POA: Diagnosis not present

## 2015-04-20 DIAGNOSIS — C50312 Malignant neoplasm of lower-inner quadrant of left female breast: Secondary | ICD-10-CM | POA: Diagnosis present

## 2015-04-20 DIAGNOSIS — I4891 Unspecified atrial fibrillation: Secondary | ICD-10-CM | POA: Diagnosis not present

## 2015-04-20 DIAGNOSIS — Z510 Encounter for antineoplastic radiation therapy: Secondary | ICD-10-CM | POA: Diagnosis present

## 2015-04-20 DIAGNOSIS — Z86010 Personal history of colonic polyps: Secondary | ICD-10-CM | POA: Diagnosis not present

## 2015-04-20 DIAGNOSIS — J439 Emphysema, unspecified: Secondary | ICD-10-CM | POA: Diagnosis not present

## 2015-04-20 DIAGNOSIS — Z170 Estrogen receptor positive status [ER+]: Secondary | ICD-10-CM | POA: Diagnosis not present

## 2015-04-20 DIAGNOSIS — I10 Essential (primary) hypertension: Secondary | ICD-10-CM | POA: Diagnosis not present

## 2015-04-20 DIAGNOSIS — E039 Hypothyroidism, unspecified: Secondary | ICD-10-CM | POA: Diagnosis not present

## 2015-04-20 DIAGNOSIS — M797 Fibromyalgia: Secondary | ICD-10-CM | POA: Diagnosis not present

## 2015-04-20 DIAGNOSIS — F329 Major depressive disorder, single episode, unspecified: Secondary | ICD-10-CM | POA: Diagnosis not present

## 2015-04-20 DIAGNOSIS — M1990 Unspecified osteoarthritis, unspecified site: Secondary | ICD-10-CM | POA: Diagnosis not present

## 2015-04-20 DIAGNOSIS — J449 Chronic obstructive pulmonary disease, unspecified: Secondary | ICD-10-CM | POA: Diagnosis not present

## 2015-04-20 DIAGNOSIS — F419 Anxiety disorder, unspecified: Secondary | ICD-10-CM | POA: Diagnosis not present

## 2015-04-20 DIAGNOSIS — E785 Hyperlipidemia, unspecified: Secondary | ICD-10-CM | POA: Diagnosis not present

## 2015-04-20 HISTORY — DX: Malignant neoplasm of unspecified site of unspecified female breast: C50.919

## 2015-04-20 NOTE — Progress Notes (Signed)
Radiation Oncology         [phone] ________________________________  Reevaluation note  Name: [name] MRN: [id number]  Date: 04/20/2015  DOB: 03-09-1934  GT:XMIWOEH,OZY, MD  Alphonsa Overall, MD   REFERRING PHYSICIAN: Alphonsa Overall, MD  DIAGNOSIS:   Breast cancer of lower-inner quadrant of left female breast [facility])   HISTORY OF PRESENT ILLNESS::[name] is a 81 y.o. female who is presents after an abnormal self-breast exam. She had a biopsy of the tumor and lymph node 02/07/2015, revealing DCIS and metastatic carcinoma in 1/1 lymph nodes. The mass is ER (95%), PR (60%), and Her2-neu negative. She had a lumpectomy of the left breast 03/22/2015, revealing invasive and in situ lobular carcinoma. She also had an axillary node dissection 03/22/2015, revealing metastatic carcinoma in 12/12 lymph nodes. She denies edema in her arm. She denies coughing or breathing problems. She is taking Tylenol and Ativan currently. She is currently receiving hormone therapy and reports that it makes her feel "weepy".  PREVIOUS RADIATION THERAPY: No  PAST MEDICAL HISTORY:  has a past medical history of Allergy history unknown; Sinus problem; Arthritis; IBS (irritable bowel syndrome); Fibromyalgia; COPD, severe; Depression; Hyperlipidemia; Osteoarthritis; Fibromyalgia; A-fib ([place]); Breast cancer of lower-inner quadrant of left female breast ([place]) (02/09/2015); Hypertension; Hypothyroidism; COPD (chronic obstructive pulmonary disease) ([place]); Peripheral edema; Emphysema (subcutaneous) (surgical) resulting from a procedure; Shortness of breath dyspnea; Pneumonia; History of bronchitis (2-3 yrs ago); Stroke [facility]); Peripheral neuropathy ([place]); Joint pain; Bruises easily; History of colon polyps; Anxiety; and Breast cancer ([place]).    PAST SURGICAL HISTORY: Past Surgical History  Procedure Laterality Date  . Tubal ligation    . Appendectomy    . Tonsillectomy    . Hemorrhoid surgery    . Cholecystectomy    .  Cataract surgery Bilateral   . Blephorplasty Bilateral   . Colonoscopy    . Breast lumpectomy with radioactive seed and axillary lymph node dissection Left 03/22/2015    Procedure: BREAST LUMPECTOMY WITH RADIOACTIVE SEED AND AXILLARY LYMPH NODE DISSECTION;  Surgeon: Alphonsa Overall, MD;  Location: [place];  Service: General;  Laterality: Left;  Left axilla and Lyph node excision    FAMILY HISTORY: family history is not on file. She was adopted.  SOCIAL HISTORY:  reports that she quit smoking about 12 years ago. She has never used smokeless tobacco. She reports that she drinks alcohol. She reports that she does not use illicit drugs.  ALLERGIES: Codeine and Penicillins  MEDICATIONS:  Current Outpatient Prescriptions  Medication Sig Dispense Refill  . acetaminophen (TYLENOL) 325 MG tablet Take 650 mg by mouth every 6 (six) hours as needed. Reported on 04/20/2015    . albuterol (PROAIR HFA) 108 (90 BASE) MCG/ACT inhaler Inhale 2 puffs into the lungs every 4 (four) hours as needed. For shortness of breath    . amiodarone (PACERONE) 200 MG tablet Take 100 mg by mouth daily.     [name] amLODipine (NORVASC) 2.5 MG tablet Take 2.5 mg by mouth daily.    [name] anastrozole (ARIMIDEX) 1 MG tablet Take 1 tablet (1 mg total) by mouth daily. 90 tablet 3  . atorvastatin (LIPITOR) 10 MG tablet Take 10 mg by mouth daily.   2  . calcium carbonate (OS-CAL) 600 MG TABS tablet Take 600 mg by mouth daily with breakfast.    . furosemide (LASIX) 40 MG tablet Take 40 mg by mouth daily.     [name] levothyroxine (SYNTHROID, LEVOTHROID) 137 MCG tablet Take 137 mcg by mouth  daily before breakfast.    . LORazepam (ATIVAN) 0.5 MG tablet Take 0.5 mg by mouth 2 (two) times daily as needed.      [name] losartan (COZAAR) 100 MG tablet Take 100 mg by mouth daily.    . magnesium oxide (MAG-OX) 400 MG tablet Take 400 mg by mouth 2 (two) times daily.      . mometasone-formoterol (DULERA) 100-5 MCG/ACT AERO Inhale 2 puffs into the lungs 2 (two) times  daily. 1 Inhaler 6  . Multiple Vitamin (MULTIVITAMIN) tablet Take 1 tablet by mouth daily.    . Rivaroxaban (XARELTO) 20 MG TABS Take 1 tablet (20 mg total) by mouth daily with supper. 30 tablet 2  . spironolactone (ALDACTONE) 25 MG tablet Take 25 mg by mouth daily.   2  . tiotropium (SPIRIVA) 18 MCG inhalation capsule Place 1 capsule (18 mcg total) into inhaler and inhale daily. 10 capsule 0  . vitamin B-12 (CYANOCOBALAMIN) 1000 MCG tablet Take 1,000 mcg by mouth daily.    . [DISCONTINUED] digoxin (LANOXIN) 0.125 MG tablet Take 1 tablet by mouth Daily.     No current facility-administered medications for this encounter.    REVIEW OF SYSTEMS:  A 15 point review of systems is documented in the electronic medical record. This was obtained by the nursing staff. However, I reviewed this with the patient to discuss relevant findings and make appropriate changes.  Pertinent items are noted in HPI.   PHYSICAL EXAM:  height is 5' 3"  (1.6 m) and weight is 152 lb 1.6 oz (68.992 kg). Her oral temperature is 98.2 F (36.8 C). Her blood pressure is 167/74 and her pulse is 60. Her respiration is 18.   General: Alert and oriented, in no acute distress Neck: Neck is supple, no palpable cervical  lymphadenopathy. Heart: Regular in rate and rhythm with no murmurs, rubs, or gallops. Chest: Clear to auscultation bilaterally, with no rhonchi, wheezes, or rales. Extremities: No cyanosis or edema. Lymphatics: see Neck Exam Psychiatric: Judgment and insight are intact. Affect is appropriate. She has a 1 cm left supraclavicular lymph node. She has a scar on the left axilla that is healing well without signs of drainage or infection. She appears to have a 5 cm seroma in the axillary region. She has a scar on her left breast on the lower-inner quadrant that is healing well. She continues to have some bruising in this area. No signs of infection.   ECOG = 1  LABORATORY DATA:  Lab Results  Component Value Date    WBC 10.7* 03/16/2015   HGB 13.7 03/16/2015   HCT 41.8 03/16/2015   MCV 99.5 03/16/2015   PLT 322 03/16/2015   NEUTROABS 8.3* 03/16/2015   Lab Results  Component Value Date   NA 140 03/16/2015   K 4.4 03/16/2015   CL 102 03/16/2015   CO2 27 03/16/2015   GLUCOSE 106* 03/16/2015   CREATININE 1.25* 03/16/2015   CALCIUM 9.5 03/16/2015      RADIOGRAPHY: Ct Chest W Contrast  03/31/2015  CLINICAL DATA:  Left breast cancer status post lumpectomy, evaluate for metastases. Prior cholecystectomy and appendectomy. EXAM: CT CHEST, ABDOMEN, AND PELVIS WITH CONTRAST TECHNIQUE: Multidetector CT imaging of the chest, abdomen and pelvis was performed following the standard protocol during bolus administration of intravenous contrast. CONTRAST:  156m OMNIPAQUE IOHEXOL 300 MG/ML  SOLN COMPARISON:  None. FINDINGS: CT CHEST FINDINGS Mediastinum/Nodes: Heart is normal in size. No pericardial effusion. Atherosclerotic calcifications of the aortic arch. Left supraclavicular and chest  wall lymphadenopathy, worrisome for nodal metastases, including: --14 mm short axis left supraclavicular node (series 2/ image 2) --11 mm short axis left supraclavicular node (series 2/image 3) --10 mm short axis left subpectoral node (series 2/ image 6) --8 mm short axis left subpectoral/axillary node (series 2/image 8) Additional small thoracic nodes, including a 9 mm short axis subcarinal node (series 2/ image 23), a 9 mm short axis right hilar node (series 2/ image 23), and a 9 mm short axis left perihilar node (series 2/image 25), indeterminate. Lungs/Pleura: Mild patchy opacities in the bilateral lower lobes (series 4/ images 40, 42, and 46), nonspecific, possibly reflecting infection. Tumor is not excluded but is considered less likely. Additional scattered small pulmonary nodules, indeterminate, including: --6 mm nodule in the anterior left upper lobe (series 4/image 8) --5 mm nodule in the lingula (series 4/ image 31) --6 mm  subpleural nodule in the medial right lower lobe (series 4/image 39) --6 mm nodule in the left lower lobe (series 4/image 40) Underlying mild to moderate centrilobular and paraseptal emphysematous changes. No pleural effusion or pneumothorax. Musculoskeletal: Postsurgical changes with fluid/ gas in the medial left breast related to recent left lumpectomy. Residual enhancing soft tissue in the anterior/ outer left breast (series 2/images 28, 30, and 33), worrisome for residual tumor, although poorly evaluated on CT. Overlying skin thickening. Surgical drain with 4.0 x 7.1 cm postoperative seroma in the left axilla. Degenerative changes of the thoracic spine. CT ABDOMEN PELVIS FINDINGS Hepatobiliary: Liver is within normal limits. No suspicious/enhancing hepatic lesions. Status post cholecystectomy. No intrahepatic or extrahepatic ductal dilatation. Pancreas: Within normal limits. Spleen: Within normal limits. Adrenals/Urinary Tract: Adrenal glands are within normal limits. Kidneys within normal limits.  No hydronephrosis. Bladder is within normal limits. Stomach/Bowel: Stomach is within normal limits. No evidence of bowel obstruction. Sigmoid diverticulosis, without evidence of diverticulitis. Vascular/Lymphatic: Atherosclerotic calcifications of the abdominal aorta and branch vessels. No evidence of abdominal aortic aneurysm. No suspicious abdominopelvic lymphadenopathy. Reproductive: Uterus is within normal limits. Bilateral ovaries are within normal limits. Other: No abdominopelvic ascites. Musculoskeletal: Degenerative changes of the lumbar spine. IMPRESSION: Postsurgical changes related to left breast lumpectomy and left axillary lymph node dissection, as above. Suspected residual enhancing soft tissue in the anterior/lateral left breast, worrisome for residual tumor, although poorly evaluated on CT. Overlying skin thickening. Associated left supraclavicular, subpectoral, and left axillary nodal metastases.  Additional small thoracic lymph nodes are indeterminate. Mild patchy opacities in the bilateral lower lobes, favored to reflect infection, less likely tumor. Scattered small pulmonary nodules measuring up to 6 mm, indeterminate. No findings specific for metastatic disease in the abdomen/pelvis. Electronically Signed   By: Julian Hy M.D.   On: 03/31/2015 13:49   Nm Bone Scan Whole Body  03/31/2015  CLINICAL DATA:  Left-sided breast malignancy treated with lumpectomy and radioactive seed implant and axillary lymph node dissection approximately 10 days ago. ; no recent trauma or fractures involving the skeleton. Remote history of fifth finger fracture of the right hand. EXAM: NUCLEAR MEDICINE WHOLE BODY BONE SCAN TECHNIQUE: Whole body anterior and posterior images were obtained approximately 3 hours after intravenous injection of radiopharmaceutical. RADIOPHARMACEUTICALS:  99.3 millicuries mCi [id number] MDP IV COMPARISON:  CT scan of the chest and CT scan of the abdomen and pelvis of today's date. FINDINGS: There is adequate uptake of the radiopharmaceutical by the skeleton. There is adequate soft tissue clearance and renal activity. There is mildly increased uptake in the posterior aspect of the cervicothoracic junction. There is  increased uptake in the posterior elements of T7, T10, and L3. No abnormal rib uptake is observed. Activity in the upper extremities is not suspicious for metastatic disease. Uptake within the pelvis and lower extremities is within the limits of normal. IMPRESSION: There is mildly increased uptake E predominantly in the posterior elements of the cervicothoracic junction, mid and lower thoracic spine, and mid lumbar spine. These findings correspond to degenerative type changes on the CT scans today. There is no objective evidence of bony metastatic disease. Electronically Signed   By: David  Martinique M.D.   On: 03/31/2015 13:51   Ct Abdomen Pelvis W Contrast  03/31/2015   CLINICAL DATA:  Left breast cancer status post lumpectomy, evaluate for metastases. Prior cholecystectomy and appendectomy. EXAM: CT CHEST, ABDOMEN, AND PELVIS WITH CONTRAST TECHNIQUE: Multidetector CT imaging of the chest, abdomen and pelvis was performed following the standard protocol during bolus administration of intravenous contrast. CONTRAST:  112m OMNIPAQUE IOHEXOL 300 MG/ML  SOLN COMPARISON:  None. FINDINGS: CT CHEST FINDINGS Mediastinum/Nodes: Heart is normal in size. No pericardial effusion. Atherosclerotic calcifications of the aortic arch. Left supraclavicular and chest wall lymphadenopathy, worrisome for nodal metastases, including: --14 mm short axis left supraclavicular node (series 2/ image 2) --11 mm short axis left supraclavicular node (series 2/image 3) --10 mm short axis left subpectoral node (series 2/ image 6) --8 mm short axis left subpectoral/axillary node (series 2/image 8) Additional small thoracic nodes, including a 9 mm short axis subcarinal node (series 2/ image 23), a 9 mm short axis right hilar node (series 2/ image 23), and a 9 mm short axis left perihilar node (series 2/image 25), indeterminate. Lungs/Pleura: Mild patchy opacities in the bilateral lower lobes (series 4/ images 40, 42, and 46), nonspecific, possibly reflecting infection. Tumor is not excluded but is considered less likely. Additional scattered small pulmonary nodules, indeterminate, including: --6 mm nodule in the anterior left upper lobe (series 4/image 8) --5 mm nodule in the lingula (series 4/ image 31) --6 mm subpleural nodule in the medial right lower lobe (series 4/image 39) --6 mm nodule in the left lower lobe (series 4/image 40) Underlying mild to moderate centrilobular and paraseptal emphysematous changes. No pleural effusion or pneumothorax. Musculoskeletal: Postsurgical changes with fluid/ gas in the medial left breast related to recent left lumpectomy. Residual enhancing soft tissue in the anterior/  outer left breast (series 2/images 28, 30, and 33), worrisome for residual tumor, although poorly evaluated on CT. Overlying skin thickening. Surgical drain with 4.0 x 7.1 cm postoperative seroma in the left axilla. Degenerative changes of the thoracic spine. CT ABDOMEN PELVIS FINDINGS Hepatobiliary: Liver is within normal limits. No suspicious/enhancing hepatic lesions. Status post cholecystectomy. No intrahepatic or extrahepatic ductal dilatation. Pancreas: Within normal limits. Spleen: Within normal limits. Adrenals/Urinary Tract: Adrenal glands are within normal limits. Kidneys within normal limits.  No hydronephrosis. Bladder is within normal limits. Stomach/Bowel: Stomach is within normal limits. No evidence of bowel obstruction. Sigmoid diverticulosis, without evidence of diverticulitis. Vascular/Lymphatic: Atherosclerotic calcifications of the abdominal aorta and branch vessels. No evidence of abdominal aortic aneurysm. No suspicious abdominopelvic lymphadenopathy. Reproductive: Uterus is within normal limits. Bilateral ovaries are within normal limits. Other: No abdominopelvic ascites. Musculoskeletal: Degenerative changes of the lumbar spine. IMPRESSION: Postsurgical changes related to left breast lumpectomy and left axillary lymph node dissection, as above. Suspected residual enhancing soft tissue in the anterior/lateral left breast, worrisome for residual tumor, although poorly evaluated on CT. Overlying skin thickening. Associated left supraclavicular, subpectoral, and left axillary nodal  metastases. Additional small thoracic lymph nodes are indeterminate. Mild patchy opacities in the bilateral lower lobes, favored to reflect infection, less likely tumor. Scattered small pulmonary nodules measuring up to 6 mm, indeterminate. No findings specific for metastatic disease in the abdomen/pelvis. Electronically Signed   By: Julian Hy M.D.   On: 03/31/2015 13:49   Mm Breast Surgical  Specimen  03/22/2015  CLINICAL DATA:  Specimen radiograph status post left breast lumpectomy. EXAM: SPECIMEN RADIOGRAPH OF THE LEFT BREAST COMPARISON:  Previous exam(s). FINDINGS: Status post excision of the left breast. The radioactive seed and biopsy marker clip are present, completely intact, and were marked for pathology. These findings were communicated with the OR at 11:37 a.m. on 03/22/2015. IMPRESSION: Specimen radiograph of the left breast. Electronically Signed   By: Ammie Ferrier M.D.   On: 03/22/2015 11:38   Mm Lt Radioactive Seed Loc Mammo Guide  03/21/2015  CLINICAL DATA:  Biopsy proven invasive ductal carcinoma and ductal carcinoma in-situ in the left breast. EXAM: MAMMOGRAPHIC GUIDED RADIOACTIVE SEED LOCALIZATION OF THE LEFT BREAST COMPARISON:  Previous exam(s). FINDINGS: Patient presents for radioactive seed localization prior to surgical excision. I met with the patient and we discussed the procedure of seed localization including benefits and alternatives. We discussed the high likelihood of a successful procedure. We discussed the risks of the procedure including infection, bleeding, tissue injury and further surgery. We discussed the low dose of radioactivity involved in the procedure. Informed, written consent was given. The usual time-out protocol was performed immediately prior to the procedure. Using mammographic guidance, sterile technique, 1% lidocaine and an I-125 radioactive seed, the ribbon shaped clip was localized using a medial to lateral approach. The follow-up mammogram images confirm the seed in the expected location and were marked for Dr. Lucia Gaskins. Follow-up survey of the patient confirms presence of the radioactive seed. Order number of I-125 seed:  [id number]. Total activity:  6.945 millicuries  Reference Date: 03/16/2015 The patient tolerated the procedure well and was released from the [place]. She was given instructions regarding seed removal. IMPRESSION:  Radioactive seed localization left breast. No apparent complications. Electronically Signed   By: Lillia Mountain M.D.   On: 03/21/2015 14:45    IMPRESSION: Breast cancer of lower-inner quadrant of left female breast , Left lumpectomy 03/22/2015: Invasive and in situ lobular carcinoma grade 2, 6 cm, LVI present, posterior lateral and anterior margins broadly involved, 12/12 LN positive with ECE, ER 95%, PR 60%, HER-2 neg ratio 1.31, Ki-67 15%, T3 N3 (stage 3C)   [name] is a 81 yo female with breast cancer in the lower-inner quadrant of the left breast. She is a good candidate for external radiation. Her case was discussed at the multidisciplinary breast conference given the very high risk for distant recurrence. No additional surgery was recommended. It was recommended the patient receive radiation to the left breast, axillary region, and supraclavicular area for local control issues. She is not an ideal candidate for systemic chemotherapy and the patient does not wish to proceed with this as part of her adjuvant treatment. She has started adjuvant hormonal therapy.  PLAN: I spoke to the patient today regarding her diagnosis and options for treatment. We discussed the equivalence in terms of survival and local failure between mastectomy and breast conservation. We discussed the role of radiation in decreasing local failures in patients who undergo lumpectomy. We discussed the process of simulation and the placement tattoos. We discussed 6 weeks of treatment as an outpatient. We discussed  the possibility of asymptomatic lung damage. We discussed the low likelihood of secondary malignancies. We discussed the possible side effects including but not limited to skin redness, fatigue, permanent skin darkening, and breast swelling. This procedure has been fully reviewed with the patient and written informed consent has been obtained.  She has a planning appointment scheduled 04/27/2015 at 10 am.       ------------------------------------------------  Blair Promise, PhD, MD    This document serves as a record of services personally performed by Gery Pray, MD. It was created on his behalf by Lendon Collar, a trained medical scribe. The creation of this record is based on the scribe's personal observations and the provider's statements to them. This document has been checked and approved by the attending provider.

## 2015-04-27 ENCOUNTER — Ambulatory Visit
Admission: RE | Admit: 2015-04-27 | Discharge: 2015-04-27 | Disposition: A | Discharge status: Home / Self Care | Payer: Medicare Other | Source: Ambulatory Visit | Attending: Radiation Oncology | Admitting: Radiation Oncology

## 2015-04-27 DIAGNOSIS — Z510 Encounter for antineoplastic radiation therapy: Secondary | ICD-10-CM | POA: Diagnosis not present

## 2015-04-27 DIAGNOSIS — C50312 Malignant neoplasm of lower-inner quadrant of left female breast: Secondary | ICD-10-CM

## 2015-04-27 NOTE — Progress Notes (Signed)
  Radiation Oncology         [phone] ________________________________  Name: [name] MRN: [id number]  Date: 04/27/2015  DOB: 06/19/1934  SIMULATION AND TREATMENT PLANNING NOTE   ICD-9-CM ICD-10-CM   1. Breast cancer of lower-inner quadrant of left female breast ([place]) 174.3 C50.312      DIAGNOSIS:  Breast cancer of lower-inner quadrant of left female breast,  pT3, N3a with positive margins  NARRATIVE:  The patient was brought to the [facility].  Identity was confirmed.  All relevant records and images related to the planned course of therapy were reviewed.  The patient freely provided informed written consent to proceed with treatment after reviewing the details related to the planned course of therapy. The consent form was witnessed and verified by the simulation staff.  Then, the patient was set-up in a stable reproducible  supine position for radiation therapy.  CT images were obtained.  Surface markings were placed.  The CT images were loaded into the planning software.  Then the target and avoidance structures were contoured.  Treatment planning then occurred.  The radiation prescription was entered and confirmed.  Then, I designed and supervised the construction of a total of 5 medically necessary complex treatment devices.  I have requested : 3D Simulation  I have requested a DVH of the following structures: heart, lungs, lumpectomy cavity.  I have ordered:dose calc.  PLAN:  The patient will receive 50.4 Gy in 28 fractions using a 4 field set up with tangent fields directed at the left breast. The axillary/supraclavicular region will be treated with a right anterior oblique and PA field.. The patient will then proceed with a boost to the lumpectomy cavity and continue to a cumulative dose of 64.4 in light of the positive surgical margins.  ________________________________ -----------------------------------  Blair Promise, PhD, MD    This document serves as a  record of services personally performed by Gery Pray, MD. It was created on his behalf by Lendon Collar, a trained medical scribe. The creation of this record is based on the scribe's personal observations and the provider's statements to them. This document has been checked and approved by the attending provider.

## 2015-04-28 ENCOUNTER — Encounter: Payer: Self-pay | Admitting: *Deleted

## 2015-04-28 NOTE — Progress Notes (Signed)
[facility] Psychosocial Distress Screening Clinical Social Work  Clinical Social Work was referred by distress screening protocol.  The patient scored a 6 on the Psychosocial Distress Thermometer which indicates moderate distress. Clinical Social Worker phoned pt to assess for distress and other psychosocial needs. CSW could not leave message, phone did not appear to have voice mail set up. CSW will attempt to connect with pt at future appointments.   ONCBCN DISTRESS SCREENING 04/20/2015  Screening Type Initial Screening  Distress experienced in past week (1-10) 6  Emotional problem type Depression;Nervousness/Anxiety;Adjusting to illness;Isolation/feeling alone;Feeling hopeless  Spiritual/Religous concerns type Loss of sense of purpose  Information Concerns Type Lack of info about treatment  Physical Problem type Breathing  Physician notified of physical symptoms   Referral to clinical psychology   Referral to clinical social work   Referral to dietition   Referral to financial advocate   Referral to support programs   Referral to palliative care     Clinical Social Worker follow up needed: Yes.    If yes, follow up plan:  See above Loren Racer, [place] Worker [place]  [facility] Phone: [phone] Fax: [phone]

## 2015-05-03 DIAGNOSIS — Z510 Encounter for antineoplastic radiation therapy: Secondary | ICD-10-CM | POA: Diagnosis not present

## 2015-05-04 ENCOUNTER — Ambulatory Visit
Admission: RE | Admit: 2015-05-04 | Discharge: 2015-05-04 | Disposition: A | Discharge status: Home / Self Care | Payer: Medicare Other | Source: Ambulatory Visit | Attending: Radiation Oncology | Admitting: Radiation Oncology

## 2015-05-04 ENCOUNTER — Ambulatory Visit: Admission: RE | Admit: 2015-05-04 | Payer: Medicare Other | Source: Ambulatory Visit | Admitting: Radiation Oncology

## 2015-05-04 DIAGNOSIS — C50312 Malignant neoplasm of lower-inner quadrant of left female breast: Secondary | ICD-10-CM

## 2015-05-04 NOTE — Progress Notes (Signed)
  Radiation Oncology         [phone] ________________________________  Name: [name] MRN: [id number]  Date: 05/04/2015  DOB: [birth year]  Simulation Verification Note    ICD-9-CM ICD-10-CM   1. Breast cancer of lower-inner quadrant of left female breast ([place]) 174.3 C50.312     Status: outpatient  NARRATIVE: The patient was brought to the treatment unit and placed in the planned treatment position. The clinical setup was verified. Then port films were obtained and uploaded to the radiation oncology medical record software.  The treatment beams were carefully compared against the planned radiation fields. The position location and shape of the radiation fields was reviewed. They targeted volume of tissue appears to be appropriately covered by the radiation beams. Organs at risk appear to be excluded as planned.  Based on my personal review, I approved the simulation verification. The patient's treatment will proceed as planned.  -----------------------------------  Blair Promise, PhD, MD

## 2015-05-05 ENCOUNTER — Ambulatory Visit
Admission: RE | Admit: 2015-05-05 | Discharge: 2015-05-05 | Disposition: A | Discharge status: Home / Self Care | Payer: Medicare Other | Source: Ambulatory Visit | Attending: Radiation Oncology | Admitting: Radiation Oncology

## 2015-05-05 DIAGNOSIS — Z510 Encounter for antineoplastic radiation therapy: Secondary | ICD-10-CM | POA: Diagnosis not present

## 2015-05-06 ENCOUNTER — Ambulatory Visit
Admission: RE | Admit: 2015-05-06 | Discharge: 2015-05-06 | Disposition: A | Discharge status: Home / Self Care | Payer: Medicare Other | Source: Ambulatory Visit | Attending: Radiation Oncology | Admitting: Radiation Oncology

## 2015-05-06 DIAGNOSIS — C50912 Malignant neoplasm of unspecified site of left female breast: Secondary | ICD-10-CM

## 2015-05-06 DIAGNOSIS — Z510 Encounter for antineoplastic radiation therapy: Secondary | ICD-10-CM | POA: Diagnosis not present

## 2015-05-06 DIAGNOSIS — C50312 Malignant neoplasm of lower-inner quadrant of left female breast: Secondary | ICD-10-CM | POA: Insufficient documentation

## 2015-05-06 MED ORDER — ALRA NON-METALLIC DEODORANT (RAD-ONC)
1.0000 "application " | Freq: Once | TOPICAL | Status: AC
  Administered 2015-05-06: 1 via TOPICAL

## 2015-05-06 MED ORDER — RADIAPLEXRX EX GEL
Freq: Once | CUTANEOUS | Status: AC
  Administered 2015-05-06: 16:00:00 via TOPICAL

## 2015-05-06 NOTE — Progress Notes (Signed)
Pt here for patient teaching.  Pt given Radiation and You booklet, skin care instructions, Alra deodorant and Radiaplex gel. Pt reports they have watched the Radiation Therapy Education video on May 06, 2015.  Reviewed areas of pertinence such as fatigue, skin changes, breast tenderness and breast swelling . Pt able to give teach back of to pat skin and use unscented/gentle soap,apply Radiaplex bid and avoid applying anything to skin within 4 hours of treatment. Pt demonstrated understanding and verbalizes understanding of information given and will contact nursing with any questions or concerns.     [email or web address]

## 2015-05-09 ENCOUNTER — Ambulatory Visit
Admission: RE | Admit: 2015-05-09 | Discharge: 2015-05-09 | Disposition: A | Discharge status: Home / Self Care | Payer: Medicare Other | Source: Ambulatory Visit | Attending: Radiation Oncology | Admitting: Radiation Oncology

## 2015-05-09 DIAGNOSIS — Z510 Encounter for antineoplastic radiation therapy: Secondary | ICD-10-CM | POA: Diagnosis not present

## 2015-05-10 ENCOUNTER — Ambulatory Visit
Admission: RE | Admit: 2015-05-10 | Discharge: 2015-05-10 | Disposition: A | Discharge status: Home / Self Care | Payer: Medicare Other | Source: Ambulatory Visit | Attending: Radiation Oncology | Admitting: Radiation Oncology

## 2015-05-10 ENCOUNTER — Encounter: Payer: Self-pay | Admitting: Radiation Oncology

## 2015-05-10 VITALS — BP 165/66 | HR 57 | Temp 97.8°F | Ht 63.0 in | Wt 150.3 lb

## 2015-05-10 DIAGNOSIS — Z510 Encounter for antineoplastic radiation therapy: Secondary | ICD-10-CM | POA: Diagnosis not present

## 2015-05-10 DIAGNOSIS — C50312 Malignant neoplasm of lower-inner quadrant of left female breast: Secondary | ICD-10-CM

## 2015-05-10 NOTE — Progress Notes (Signed)
  Radiation Oncology         [phone] ________________________________  Name: DEHLIA KILNER MRN: [id number]  Date: 05/10/2015  DOB: 05/31/1934  Weekly Radiation Therapy Management    ICD-9-CM ICD-10-CM   1. Breast cancer of lower-inner quadrant of left female breast ([facility]) 174.3 C50.312      Current Dose: 7.2 Gy     Planned Dose:  64.4 Gy  Narrative . . . . . . . . The patient presents for routine under treatment assessment.                                   The patient is without complaint. Using radiaPlex for her skin. Accompanied by son on evaluation today.                                 Set-up films were reviewed.                                 The chart was checked. Physical Findings. . .  height is '5\' 3"'$  (1.6 m) and weight is 150 lb 4.8 oz (68.176 kg). Her oral temperature is 97.8 F (36.6 C). Her blood pressure is 165/66 and her pulse is 57. . The lungs are clear. The heart has regular rhythm and rate. No appreciable radiation reaction at this time along the left breast. Impression . . . . . . . The patient is tolerating radiation. Plan . . . . . . . . . . . . Continue treatment as planned.  ________________________________   Blair Promise, PhD, MD

## 2015-05-10 NOTE — Progress Notes (Signed)
[name] has completed 4 fractions to her left breast.  She denies having pain.  The skin on her left breast is intact.  She is using radiaplex.  BP 165/66 mmHg  Pulse 57  Temp(Src) 97.8 F (36.6 C) (Oral)  Ht '5\' 3"'$  (1.6 m)  Wt 150 lb 4.8 oz (68.176 kg)  BMI 26.63 kg/m2

## 2015-05-11 ENCOUNTER — Ambulatory Visit
Admission: RE | Admit: 2015-05-11 | Discharge: 2015-05-11 | Disposition: A | Discharge status: Home / Self Care | Payer: Medicare Other | Source: Ambulatory Visit | Attending: Radiation Oncology | Admitting: Radiation Oncology

## 2015-05-11 DIAGNOSIS — Z510 Encounter for antineoplastic radiation therapy: Secondary | ICD-10-CM | POA: Diagnosis not present

## 2015-05-12 ENCOUNTER — Telehealth: Payer: Self-pay | Admitting: *Deleted

## 2015-05-12 ENCOUNTER — Ambulatory Visit
Admission: RE | Admit: 2015-05-12 | Discharge: 2015-05-12 | Disposition: A | Discharge status: Home / Self Care | Payer: Medicare Other | Source: Ambulatory Visit | Attending: Radiation Oncology | Admitting: Radiation Oncology

## 2015-05-12 DIAGNOSIS — Z510 Encounter for antineoplastic radiation therapy: Secondary | ICD-10-CM | POA: Diagnosis not present

## 2015-05-12 NOTE — Telephone Encounter (Signed)
Left message for a return phone call to follow up after start of radiation.   

## 2015-05-13 ENCOUNTER — Ambulatory Visit
Admission: RE | Admit: 2015-05-13 | Discharge: 2015-05-13 | Disposition: A | Discharge status: Home / Self Care | Payer: Medicare Other | Source: Ambulatory Visit | Attending: Radiation Oncology | Admitting: Radiation Oncology

## 2015-05-13 DIAGNOSIS — Z510 Encounter for antineoplastic radiation therapy: Secondary | ICD-10-CM | POA: Diagnosis not present

## 2015-05-16 ENCOUNTER — Ambulatory Visit
Admission: RE | Admit: 2015-05-16 | Discharge: 2015-05-16 | Disposition: A | Discharge status: Home / Self Care | Payer: Medicare Other | Source: Ambulatory Visit | Attending: Radiation Oncology | Admitting: Radiation Oncology

## 2015-05-16 DIAGNOSIS — Z510 Encounter for antineoplastic radiation therapy: Secondary | ICD-10-CM | POA: Diagnosis not present

## 2015-05-17 ENCOUNTER — Encounter: Payer: Self-pay | Admitting: Radiation Oncology

## 2015-05-17 ENCOUNTER — Ambulatory Visit
Admission: RE | Admit: 2015-05-17 | Discharge: 2015-05-17 | Disposition: A | Discharge status: Home / Self Care | Payer: Medicare Other | Source: Ambulatory Visit | Attending: Radiation Oncology | Admitting: Radiation Oncology

## 2015-05-17 VITALS — BP 156/70 | HR 49 | Temp 97.9°F | Resp 18 | Ht 63.0 in | Wt 144.5 lb

## 2015-05-17 DIAGNOSIS — Z510 Encounter for antineoplastic radiation therapy: Secondary | ICD-10-CM | POA: Diagnosis not present

## 2015-05-17 DIAGNOSIS — C50312 Malignant neoplasm of lower-inner quadrant of left female breast: Secondary | ICD-10-CM

## 2015-05-17 NOTE — Progress Notes (Signed)
  Radiation Oncology         [phone] ________________________________  Name: JANARI GAGNER MRN: [id number]  Date: 05/17/2015  DOB: [birth year]  Weekly Radiation Therapy Management    ICD-9-CM ICD-10-CM   1. Breast cancer of lower-inner quadrant of left female breast ([facility]) 174.3 C50.312      Current Dose: 16.2 Gy     Planned Dose:  64.4 Gy  Narrative . . . . . . . . The patient presents for routine under treatment assessment.                                   The patient is without complaint.  No itching or discomfort within the breast no fatigue                                 Set-up films were reviewed.                                 The chart was checked. Physical Findings. . .  height is '5\' 3"'$  (1.6 m) and weight is 144 lb 8 oz (65.545 kg). Her oral temperature is 97.9 F (36.6 C). Her blood pressure is 156/70 and her pulse is 49. Her respiration is 18. . Weight essentially stable.  No significant changes.  The lungs are clear. The heart has a regular rhythm and rate. The left breast area shows mild erythema. Minimal erythema in the supraclavicular fossa Impression . . . . . . . The patient is tolerating radiation. Plan . . . . . . . . . . . . Continue treatment as planned.  ________________________________   Blair Promise, PhD, MD

## 2015-05-17 NOTE — Progress Notes (Signed)
[name] has completed 9 fractions to her left breast.  She denies having pain or fatigue.  The skin on her left breast is intact.  She is using radiaplex.  BP 156/70 mmHg  Pulse 49  Temp(Src) 97.9 F (36.6 C) (Oral)  Resp 18  Ht '5\' 3"'$  (1.6 m)  Wt 144 lb 8 oz (65.545 kg)  BMI 25.60 kg/m2

## 2015-05-18 ENCOUNTER — Ambulatory Visit
Admission: RE | Admit: 2015-05-18 | Discharge: 2015-05-18 | Disposition: A | Discharge status: Home / Self Care | Payer: Medicare Other | Source: Ambulatory Visit | Attending: Radiation Oncology | Admitting: Radiation Oncology

## 2015-05-18 DIAGNOSIS — Z510 Encounter for antineoplastic radiation therapy: Secondary | ICD-10-CM | POA: Diagnosis not present

## 2015-05-19 ENCOUNTER — Ambulatory Visit
Admission: RE | Admit: 2015-05-19 | Discharge: 2015-05-19 | Disposition: A | Discharge status: Home / Self Care | Payer: Medicare Other | Source: Ambulatory Visit | Attending: Radiation Oncology | Admitting: Radiation Oncology

## 2015-05-19 DIAGNOSIS — Z510 Encounter for antineoplastic radiation therapy: Secondary | ICD-10-CM | POA: Diagnosis not present

## 2015-05-20 ENCOUNTER — Ambulatory Visit
Admission: RE | Admit: 2015-05-20 | Discharge: 2015-05-20 | Disposition: A | Discharge status: Home / Self Care | Payer: Medicare Other | Source: Ambulatory Visit | Attending: Radiation Oncology | Admitting: Radiation Oncology

## 2015-05-20 DIAGNOSIS — Z510 Encounter for antineoplastic radiation therapy: Secondary | ICD-10-CM | POA: Diagnosis not present

## 2015-05-23 ENCOUNTER — Ambulatory Visit
Admission: RE | Admit: 2015-05-23 | Discharge: 2015-05-23 | Disposition: A | Discharge status: Home / Self Care | Payer: Medicare Other | Source: Ambulatory Visit | Attending: Radiation Oncology | Admitting: Radiation Oncology

## 2015-05-23 DIAGNOSIS — Z510 Encounter for antineoplastic radiation therapy: Secondary | ICD-10-CM | POA: Diagnosis not present

## 2015-05-24 ENCOUNTER — Ambulatory Visit
Admission: RE | Admit: 2015-05-24 | Discharge: 2015-05-24 | Disposition: A | Discharge status: Home / Self Care | Payer: Medicare Other | Source: Ambulatory Visit | Attending: Radiation Oncology | Admitting: Radiation Oncology

## 2015-05-24 ENCOUNTER — Encounter: Payer: Self-pay | Admitting: Radiation Oncology

## 2015-05-24 VITALS — BP 152/65 | HR 56 | Temp 98.1°F | Ht 63.0 in | Wt 148.2 lb

## 2015-05-24 DIAGNOSIS — C50312 Malignant neoplasm of lower-inner quadrant of left female breast: Secondary | ICD-10-CM

## 2015-05-24 DIAGNOSIS — Z510 Encounter for antineoplastic radiation therapy: Secondary | ICD-10-CM | POA: Diagnosis not present

## 2015-05-24 NOTE — Progress Notes (Signed)
[name] has completed 14 fractions to her left breast.  She reports havng occasional pains in her left breast.  She reports having fatigue.  She is using radiaplex.  The skin on her left breast is red.  BP 152/65 mmHg  Pulse 56  Temp(Src) 98.1 F (36.7 C) (Oral)  Ht '5\' 3"'$  (1.6 m)  Wt 148 lb 3.2 oz (67.223 kg)  BMI 26.26 kg/m2

## 2015-05-24 NOTE — Progress Notes (Signed)
  Radiation Oncology         [phone] ________________________________  Name: [name] MRN: [id number]  Date: 05/24/2015  DOB: 12/27/1934  Weekly Radiation Therapy Management    ICD-9-CM ICD-10-CM   1. Breast cancer of lower-inner quadrant of left female breast ([facility]) 174.3 C50.312      Current Dose: 25.2 Gy     Planned Dose:  64.4 Gy  Narrative . . . . . . . . The patient presents for routine under treatment assessment.                                   The patient is without complaint. She noticed some fatigue. She denies any itching or discomfort within the breast.                                 Set-up films were reviewed.                                 The chart was checked. Physical Findings. . .  height is '5\' 3"'$  (1.6 m) and weight is 148 lb 3.2 oz (67.223 kg). Her oral temperature is 98.1 F (36.7 C). Her blood pressure is 152/65 and her pulse is 56. . Weight essentially stable.  No significant changes.  The lungs are clear. The heart has a regular rhythm and rate. The left breast area shows some erythema but no skin breakdown. Impression . . . . . . . The patient is tolerating radiation. Plan . . . . . . . . . . . . Continue treatment as planned.  ________________________________   Blair Promise, PhD, MD

## 2015-05-25 ENCOUNTER — Ambulatory Visit
Admission: RE | Admit: 2015-05-25 | Discharge: 2015-05-25 | Disposition: A | Discharge status: Home / Self Care | Payer: Medicare Other | Source: Ambulatory Visit | Attending: Radiation Oncology | Admitting: Radiation Oncology

## 2015-05-25 DIAGNOSIS — Z510 Encounter for antineoplastic radiation therapy: Secondary | ICD-10-CM | POA: Diagnosis not present

## 2015-05-26 ENCOUNTER — Ambulatory Visit
Admission: RE | Admit: 2015-05-26 | Discharge: 2015-05-26 | Disposition: A | Discharge status: Home / Self Care | Payer: Medicare Other | Source: Ambulatory Visit | Attending: Radiation Oncology | Admitting: Radiation Oncology

## 2015-05-26 DIAGNOSIS — Z510 Encounter for antineoplastic radiation therapy: Secondary | ICD-10-CM | POA: Diagnosis not present

## 2015-05-27 ENCOUNTER — Ambulatory Visit
Admission: RE | Admit: 2015-05-27 | Discharge: 2015-05-27 | Disposition: A | Discharge status: Home / Self Care | Payer: Medicare Other | Source: Ambulatory Visit | Attending: Radiation Oncology | Admitting: Radiation Oncology

## 2015-05-27 DIAGNOSIS — Z510 Encounter for antineoplastic radiation therapy: Secondary | ICD-10-CM | POA: Diagnosis not present

## 2015-05-30 ENCOUNTER — Ambulatory Visit
Admission: RE | Admit: 2015-05-30 | Discharge: 2015-05-30 | Disposition: A | Discharge status: Home / Self Care | Payer: Medicare Other | Source: Ambulatory Visit | Attending: Radiation Oncology | Admitting: Radiation Oncology

## 2015-05-30 DIAGNOSIS — Z510 Encounter for antineoplastic radiation therapy: Secondary | ICD-10-CM | POA: Diagnosis not present

## 2015-05-31 ENCOUNTER — Ambulatory Visit
Admission: RE | Admit: 2015-05-31 | Discharge: 2015-05-31 | Disposition: A | Discharge status: Home / Self Care | Payer: Medicare Other | Source: Ambulatory Visit | Attending: Radiation Oncology | Admitting: Radiation Oncology

## 2015-05-31 ENCOUNTER — Encounter: Payer: Self-pay | Admitting: Radiation Oncology

## 2015-05-31 VITALS — BP 166/69 | HR 59 | Temp 98.0°F | Resp 16 | Ht 63.0 in | Wt 148.5 lb

## 2015-05-31 DIAGNOSIS — C50312 Malignant neoplasm of lower-inner quadrant of left female breast: Secondary | ICD-10-CM

## 2015-05-31 DIAGNOSIS — Z510 Encounter for antineoplastic radiation therapy: Secondary | ICD-10-CM | POA: Diagnosis not present

## 2015-05-31 NOTE — Progress Notes (Addendum)
[name] has completed 19 fractions to her left breast.  She denies having pain except the back of her head is sore from the treatment table.  She reports having fatigue.  She is using radiplex.  The skin on her left breast is red with dermatitis on the upper inner portion.  BP 166/69 mmHg  Pulse 59  Temp(Src) 98 F (36.7 C) (Oral)  Resp 16  Ht '5\' 3"'$  (1.6 m)  Wt 148 lb 8 oz (67.359 kg)  BMI 26.31 kg/m2

## 2015-05-31 NOTE — Progress Notes (Signed)
  Radiation Oncology         [phone] ________________________________  Name: [name] MRN: [id number]  Date: 05/31/2015  DOB: 1934-03-22  Weekly Radiation Therapy Management    ICD-9-CM ICD-10-CM   1. Breast cancer of lower-inner quadrant of left female breast ([facility]) 174.3 C50.312      Current Dose: 34.2 Gy     Planned Dose:  64.4 Gy  Narrative . . . . . . . . The patient presents for routine under treatment assessment.                                   The patient is without complaint. [name] has completed 19 fractions to her left breast. She denies having pain except the back of her head is sore from the treatment table. She reports having fatigue. She is using radiplex.                                  Set-up films were reviewed.                                 The chart was checked. Physical Findings. . .  height is '5\' 3"'$  (1.6 m) and weight is 148 lb 8 oz (67.359 kg). Her oral temperature is 98 F (36.7 C). Her blood pressure is 166/69 and her pulse is 59. Her respiration is 16. . Weight essentially stable.  No significant changes. The lungs are clear. The heart has a regular rhythm and rate. he left breast area shows some erythema and dermatitis in the upper inner aspect. No moist desquamation Impression . . . . . . . The patient is tolerating radiation. Plan . . . . . . . . . . . . Continue treatment as planned.  ________________________________   Blair Promise, PhD, MD

## 2015-06-01 ENCOUNTER — Ambulatory Visit
Admission: RE | Admit: 2015-06-01 | Discharge: 2015-06-01 | Disposition: A | Discharge status: Home / Self Care | Payer: Medicare Other | Source: Ambulatory Visit | Attending: Radiation Oncology | Admitting: Radiation Oncology

## 2015-06-01 DIAGNOSIS — Z510 Encounter for antineoplastic radiation therapy: Secondary | ICD-10-CM | POA: Diagnosis not present

## 2015-06-02 ENCOUNTER — Ambulatory Visit
Admission: RE | Admit: 2015-06-02 | Discharge: 2015-06-02 | Disposition: A | Discharge status: Home / Self Care | Payer: Medicare Other | Source: Ambulatory Visit | Attending: Radiation Oncology | Admitting: Radiation Oncology

## 2015-06-02 DIAGNOSIS — C50312 Malignant neoplasm of lower-inner quadrant of left female breast: Secondary | ICD-10-CM

## 2015-06-02 DIAGNOSIS — Z510 Encounter for antineoplastic radiation therapy: Secondary | ICD-10-CM | POA: Diagnosis not present

## 2015-06-02 MED ORDER — RADIAPLEXRX EX GEL
Freq: Once | CUTANEOUS | Status: AC
  Administered 2015-06-02: 15:00:00 via TOPICAL

## 2015-06-03 ENCOUNTER — Ambulatory Visit
Admission: RE | Admit: 2015-06-03 | Discharge: 2015-06-03 | Disposition: A | Discharge status: Home / Self Care | Payer: Medicare Other | Source: Ambulatory Visit | Attending: Radiation Oncology | Admitting: Radiation Oncology

## 2015-06-03 DIAGNOSIS — Z510 Encounter for antineoplastic radiation therapy: Secondary | ICD-10-CM | POA: Diagnosis not present

## 2015-06-06 ENCOUNTER — Ambulatory Visit
Admission: RE | Admit: 2015-06-06 | Discharge: 2015-06-06 | Disposition: A | Discharge status: Home / Self Care | Payer: Medicare Other | Source: Ambulatory Visit | Attending: Radiation Oncology | Admitting: Radiation Oncology

## 2015-06-06 DIAGNOSIS — Z510 Encounter for antineoplastic radiation therapy: Secondary | ICD-10-CM | POA: Diagnosis not present

## 2015-06-07 ENCOUNTER — Ambulatory Visit
Admission: RE | Admit: 2015-06-07 | Discharge: 2015-06-07 | Disposition: A | Discharge status: Home / Self Care | Payer: Medicare Other | Source: Ambulatory Visit | Attending: Radiation Oncology | Admitting: Radiation Oncology

## 2015-06-07 ENCOUNTER — Encounter: Payer: Self-pay | Admitting: Radiation Oncology

## 2015-06-07 VITALS — BP 142/66 | HR 52 | Temp 97.8°F | Ht 63.0 in | Wt 148.6 lb

## 2015-06-07 DIAGNOSIS — Z510 Encounter for antineoplastic radiation therapy: Secondary | ICD-10-CM | POA: Diagnosis not present

## 2015-06-07 DIAGNOSIS — Z923 Personal history of irradiation: Secondary | ICD-10-CM | POA: Insufficient documentation

## 2015-06-07 DIAGNOSIS — C50312 Malignant neoplasm of lower-inner quadrant of left female breast: Secondary | ICD-10-CM

## 2015-06-07 MED ORDER — SONAFINE EX EMUL
1.0000 | Freq: Two times a day (BID) | CUTANEOUS | Status: DC
Start: 2015-06-07 — End: 2015-06-10
  Administered 2015-06-07: 1 via TOPICAL
  Filled 2015-06-07: qty 45

## 2015-06-07 NOTE — Progress Notes (Signed)
Ms. Bignell has received 24 fractions to her left breast.  Note diffuse erythema of the tx field which encompasses the breast, inframmary fold, [place] region, and left shoulder blade region. She notes itching, and is asking if she can use Hydrocortisone cream in the tx region.

## 2015-06-07 NOTE — Progress Notes (Signed)
  Radiation Oncology         [phone] ________________________________  Name: [name] MRN: [id number]  Date: 06/07/2015  DOB: 1934/10/28  Weekly Radiation Therapy Management    ICD-9-CM ICD-10-CM   1. Breast cancer of lower-inner quadrant of left female breast ([facility]) 174.3 C50.312 SONAFINE emulsion 1 application     Current Dose: 43.2 Gy     Planned Dose:  64.4 Gy   Narrative . . . . . . . . The patient presents for routine under treatment assessment.                                  [name] has received 24 fractions to her left breast. Note diffuse erythema of the tx field which encompasses the breast, inframmary fold, [place] region, and left shoulder blade region. She notes itching, and is asking if she can use Hydrocortisone cream in the tx region.                                  Set-up films were reviewed.                                 The chart was checked. Physical Findings. . .  height is '5\' 3"'$  (1.6 m) and weight is 148 lb 9.6 oz (67.405 kg). Her temperature is 97.8 F (36.6 C). Her blood pressure is 142/66 and her pulse is 52. . Brisk erythema throughout the treatment area. Dry desquamation but no moist desquamation. Impression . . . . . . . The patient is tolerating radiation. Plan . . . . . . . . . . . . Continue treatment as planned. We'll use Sonofine and hydrocortisone cream.  ________________________________   Blair Promise, PhD, MD

## 2015-06-08 ENCOUNTER — Ambulatory Visit
Admission: RE | Admit: 2015-06-08 | Discharge: 2015-06-08 | Disposition: A | Discharge status: Home / Self Care | Payer: Medicare Other | Source: Ambulatory Visit | Attending: Radiation Oncology | Admitting: Radiation Oncology

## 2015-06-08 DIAGNOSIS — Z510 Encounter for antineoplastic radiation therapy: Secondary | ICD-10-CM | POA: Diagnosis not present

## 2015-06-08 NOTE — Addendum Note (Signed)
Encounter addended by: Neysa Hotter, [facility] on: 06/08/2015  7:41 AM<BR>     Documentation filed: Rx Order Verification

## 2015-06-09 ENCOUNTER — Ambulatory Visit
Admission: RE | Admit: 2015-06-09 | Discharge: 2015-06-09 | Disposition: A | Discharge status: Home / Self Care | Payer: Medicare Other | Source: Ambulatory Visit | Attending: Radiation Oncology | Admitting: Radiation Oncology

## 2015-06-09 DIAGNOSIS — Z510 Encounter for antineoplastic radiation therapy: Secondary | ICD-10-CM | POA: Diagnosis not present

## 2015-06-10 ENCOUNTER — Ambulatory Visit
Admission: RE | Admit: 2015-06-10 | Discharge: 2015-06-10 | Disposition: A | Discharge status: Home / Self Care | Payer: Medicare Other | Source: Ambulatory Visit | Attending: Radiation Oncology | Admitting: Radiation Oncology

## 2015-06-10 DIAGNOSIS — Z510 Encounter for antineoplastic radiation therapy: Secondary | ICD-10-CM | POA: Diagnosis not present

## 2015-06-13 ENCOUNTER — Ambulatory Visit
Admission: RE | Admit: 2015-06-13 | Discharge: 2015-06-13 | Disposition: A | Discharge status: Home / Self Care | Payer: Medicare Other | Source: Ambulatory Visit | Attending: Radiation Oncology | Admitting: Radiation Oncology

## 2015-06-13 DIAGNOSIS — Z510 Encounter for antineoplastic radiation therapy: Secondary | ICD-10-CM | POA: Diagnosis not present

## 2015-06-14 ENCOUNTER — Ambulatory Visit: Payer: Medicare Other

## 2015-06-14 ENCOUNTER — Encounter: Payer: Self-pay | Admitting: Radiation Oncology

## 2015-06-14 ENCOUNTER — Ambulatory Visit
Admission: RE | Admit: 2015-06-14 | Discharge: 2015-06-14 | Disposition: A | Discharge status: Home / Self Care | Payer: Medicare Other | Source: Ambulatory Visit | Attending: Radiation Oncology | Admitting: Radiation Oncology

## 2015-06-14 ENCOUNTER — Telehealth: Payer: Self-pay | Admitting: Oncology

## 2015-06-14 VITALS — BP 116/75 | HR 66 | Temp 98.3°F | Ht 63.0 in | Wt 147.0 lb

## 2015-06-14 DIAGNOSIS — C50312 Malignant neoplasm of lower-inner quadrant of left female breast: Secondary | ICD-10-CM

## 2015-06-14 DIAGNOSIS — Z510 Encounter for antineoplastic radiation therapy: Secondary | ICD-10-CM | POA: Diagnosis not present

## 2015-06-14 MED ORDER — SILVER SULFADIAZINE 1 % EX CREA
TOPICAL_CREAM | Freq: Once | CUTANEOUS | Status: AC
  Administered 2015-06-14: 16:00:00 via TOPICAL

## 2015-06-14 NOTE — Progress Notes (Signed)
  Radiation Oncology         (336) [phone] ________________________________  Name: [name] MRN: [id number]  Date: 06/14/2015  DOB: 1934/11/06  Weekly Radiation Therapy Management    ICD-9-CM ICD-10-CM   1. Breast cancer of lower-inner quadrant of left female breast ([facility]) 174.3 C50.312 silver sulfADIAZINE (SILVADENE) 1 % cream    Current Dose: 50.4 Gy     Planned Dose:  64.4 Gy  Narrative . . . . . . . . The patient presents for routine under treatment assessment.                                  Jirah Rider has completed 29 fractions to her left breast. She reports having burning and sharp pain in her left breast last night that kept her from sleeping. "Feels like pins." She is taking tylenol and ativan. She is using radiaplex and sonafine. She also reports pruritus. She reports fatigue. The skin on her left neck, upper back, and breast is red. She has some scabbed areas on her left neck area and upper back. She has an area of desquamation under her left breast.                                 Set-up films were reviewed.                                 The chart was checked. Physical Findings. . .  height is '5\' 3"'$  (1.6 m) and weight is 147 lb (66.679 kg). Her oral temperature is 98.3 F (36.8 C). Her blood pressure is 116/75 and her pulse is 66. . Brisk erythema in the left supraclavicular region, left breast, and moist desqumation in the inframammary fold. Impression . . . . . . . The patient is tolerating radiation. Plan . . . . . . . . . . . . Continue treatment as planned. We will give the patient silvadene to apply to the area. I also advised the patient to use hydrocortisone cream and oral benadryl.  ________________________________   Blair Promise, PhD, MD  This document serves as a record of services personally performed by Gery Pray, MD. It was created on his behalf by Darcus Austin, a trained medical scribe. The creation of this record is based on the scribe's personal  observations and the provider's statements to them. This document has been checked and approved by the attending provider.

## 2015-06-14 NOTE — Progress Notes (Signed)
  Radiation Oncology         [phone] ________________________________  Name: [name] MRN: [id number]  Date: 06/14/2015  DOB: 03/01/1934  Electron beam Simulation Verification Note    ICD-9-CM ICD-10-CM   1. Breast cancer of lower-inner quadrant of left female breast ([place]) 174.3 C50.312     Status: outpatient  NARRATIVE: The patient was brought to the treatment unit and placed in the planned treatment position. The clinical setup was verified. Then port films were obtained and uploaded to the radiation oncology medical record software.  The treatment beams were carefully compared against the planned radiation fields. The position location and shape of the radiation fields was reviewed. They targeted volume of tissue appears to be appropriately covered by the radiation beams. Organs at risk appear to be excluded as planned.  Based on my personal review, I approved the simulation verification. The patient's treatment will proceed as planned.  -----------------------------------  Blair Promise, PhD, MD

## 2015-06-14 NOTE — Telephone Encounter (Signed)
[name] called and said she did not sleep last night due to burning in her left breast.  She said the burning started yesterday evening.  She would like to see Dr. Sondra Come before treatment today.  Advised her to come in at 3:15.

## 2015-06-14 NOTE — Progress Notes (Signed)
[name] has completed 29 fractions to her left breast.  She reports having burning and sharp pain in her left breast last night that kept her from sleeping.  She is using radiaplex and sonafine.  She also reports having itching.  She reports having fatigue.  The skin on her left neck, upper back and breast is red.  She has some scabbed areas on her left neck area and upper back.  She has an area of desquamation under her left breast.  BP 116/75 mmHg  Pulse 66  Temp(Src) 98.3 F (36.8 C) (Oral)  Ht '5\' 3"'$  (1.6 m)  Wt 147 lb (66.679 kg)  BMI 26.05 kg/m2   Wt Readings from Last 3 Encounters:  06/14/15 147 lb (66.679 kg)  06/07/15 148 lb 9.6 oz (67.405 kg)  05/31/15 148 lb 8 oz (67.359 kg)

## 2015-06-15 ENCOUNTER — Telehealth: Payer: Self-pay | Admitting: Oncology

## 2015-06-15 ENCOUNTER — Ambulatory Visit: Payer: Medicare Other

## 2015-06-15 NOTE — Telephone Encounter (Addendum)
Saran called and said she is not coming for treatment today.  She said she is too exhausted.  She is using the silvadene cream and the itching is better.  She said she will be in for treatment tomorrow.  Notified Merrilee Seashore on [place] [address].

## 2015-06-16 ENCOUNTER — Ambulatory Visit
Admission: RE | Admit: 2015-06-16 | Discharge: 2015-06-16 | Disposition: A | Discharge status: Home / Self Care | Payer: Medicare Other | Source: Ambulatory Visit | Attending: Radiation Oncology | Admitting: Radiation Oncology

## 2015-06-16 ENCOUNTER — Telehealth: Payer: Self-pay | Admitting: Oncology

## 2015-06-16 ENCOUNTER — Ambulatory Visit: Admission: RE | Admit: 2015-06-16 | Payer: Medicare Other | Source: Ambulatory Visit

## 2015-06-16 VITALS — BP 132/87 | HR 69 | Temp 98.0°F | Ht 63.0 in | Wt 148.0 lb

## 2015-06-16 DIAGNOSIS — Z510 Encounter for antineoplastic radiation therapy: Secondary | ICD-10-CM | POA: Diagnosis not present

## 2015-06-16 DIAGNOSIS — C50312 Malignant neoplasm of lower-inner quadrant of left female breast: Secondary | ICD-10-CM

## 2015-06-16 MED ORDER — DIPHENHYDRAMINE HCL 25 MG PO TABS
25.0000 mg | ORAL_TABLET | Freq: Once | ORAL | Status: AC
  Administered 2015-06-16: 25 mg via ORAL
  Filled 2015-06-16: qty 1

## 2015-06-16 NOTE — Progress Notes (Signed)
[name] has completed 29 fractions to her left breast.  She reports noticing red spots on her chest and abdomen this morning.  Also her lower lip is swollen and has ulcers on the inside.  She used silvadene once on Tuesday.  She has also used hydrocortisone cream 1% and neosporin with pain starting on Tuesday.  She has not used radiaplex or sonafine since last week.  She has a red rash on her entire chest/back/abomen area.  The skin on her neck, upper left back and left chest is red.  She has a blister on her left breast by her underarm and also desquamation under her breast.  BP 132/87 mmHg  Pulse 69  Temp(Src) 98 F (36.7 C) (Oral)  Ht '5\' 3"'$  (1.6 m)  Wt 148 lb (67.132 kg)  BMI 26.22 kg/m2

## 2015-06-16 NOTE — Telephone Encounter (Signed)
Called in prescriptions for Benadryl (Take 25 mg by mouth every 6 (six) hours. Use every 6 hours until rash improves. Disp. 25 tablets. O refills) and methylPREDNISolone (MEDROL DOSEPAK) 4 MG TBPK tablet to [facility] on [address] per Dr. Sondra Come.

## 2015-06-16 NOTE — Telephone Encounter (Signed)
[name] called and said her left breast is very red.  She also has little bumps all over her left chest and stomach area.  She said she has used the silvadene once two days ago and has been applying hydrocortisone cream and neosporin.  Advised her to come in at 3:10 before treatment to be seen by the doctor.

## 2015-06-16 NOTE — Progress Notes (Signed)
Gave patient benadryl 25 mg tablet per Dr. Sondra Come.  Advised her that benadryl will make her sleepy and to avoid driving while taking it.  [name] verbalized agreement and understanding.

## 2015-06-16 NOTE — Progress Notes (Signed)
  Radiation Oncology         (336) [phone] ________________________________  Name: [name] MRN: [id number]  Date: 06/16/2015  DOB: 1934/09/15  Weekly Radiation Therapy Management    ICD-9-CM ICD-10-CM   1. Breast cancer of lower-inner quadrant of left female breast ([facility]) 174.3 C50.312 diphenhydrAMINE (BENADRYL) tablet 25 mg    Current Dose: 52.4 Gy     Planned Dose:  64.4 Gy  Narrative . . . . . . . . The patient presents for routine under treatment assessment.                                  has completed 29 fractions to her left breast. She reports noticing red spots on her chest and abdomen this morning. Also her lower lip is swollen and has ulcers on the inside. She used silvadene once on Tuesday. She has also used hydrocortisone cream 1% and neosporin with pain starting on Tuesday. She has not used radiaplex or sonafine since last week. She has a red rash on her entire chest/back/abomen area. The skin on her neck, upper left back and left chest is red. She has a blister on her left breast near her axilla and also desquamation under her breast.                                 Set-up films were reviewed.                                 The chart was checked. Physical Findings. . .  height is '5\' 3"'$  (1.6 m) and weight is 148 lb (67.132 kg). Her oral temperature is 98 F (36.7 C). Her blood pressure is 132/87 and her pulse is 69. Her oxygen saturation is 95%.   Brisk erythema throughout the left breast. Moist desquamation in the left inframammary fold. Macular rash extending outside of the radiation field and into the abdomen and neck region. Ulcer along the lower lip. Impression . . . . . . . The patient is tolerating radiation. Plan . . . . . . . . . . . . She won't get her treatment today and we will give her a break until Tuesday of next week. She will be examined prior to her treatment. The patient will stop using all of her skin cream and she will use hydrocortisone cream if the  itching keeps her awake. She also been given a prescription for oral benadryl and prednisone for what appears to be an allergic reaction.  ________________________________   Blair Promise, PhD, MD  This document serves as a record of services personally performed by Gery Pray, MD. It was created on his behalf by Darcus Austin, a trained medical scribe. The creation of this record is based on the scribe's personal observations and the provider's statements to them. This document has been checked and approved by the attending provider.

## 2015-06-17 ENCOUNTER — Telehealth: Payer: Self-pay | Admitting: Oncology

## 2015-06-17 ENCOUNTER — Ambulatory Visit: Payer: Medicare Other

## 2015-06-17 NOTE — Telephone Encounter (Signed)
[name] called and said her rash is worse.  It is now down to her feet.  She said the benadryl made her sleepy and she slept all night.  She has not started the medrol Dosepak yet.  Advised her to start the [place] as soon as possible and to follow the first day directions.  She verbalized agreement and understanding.  Then advised her to take 1 benadryl tablet (25 mg) every 6 hours.  Advised her to go to the ER if the rash gets any worse or she has trouble breathing.    Also called her son [name] and advised him that [name] had not started the medrol Dosepak yet or anymore benadryl.  Asked if he could check on her.  He said his brother was going to check on her now.  Also advised that they take her to the ER if the rash is worse.  [name] verbalized agreement and understanding.

## 2015-06-17 NOTE — Telephone Encounter (Signed)
[facility] and she said she is feeling better.  She has started taking the Medrol Dosepak.  She denies feeling short of breath.  Also advised her that she can take Zantac PO 1 tablet 3 times a day over the counter per Shona Simpson, PA which will also help.  Caitlain verbalized agreement and understanding.

## 2015-06-20 ENCOUNTER — Telehealth: Payer: Self-pay | Admitting: Oncology

## 2015-06-20 ENCOUNTER — Ambulatory Visit: Payer: Medicare Other

## 2015-06-20 NOTE — Telephone Encounter (Signed)
[name] said her rash is better.  It has faded to pink spots and she has just a little bit of itching.  She said her left breast is still raw and peeling underneath.  She is planning on coming in at 3:10 tomorrow to see Dr. Sondra Come.

## 2015-06-21 ENCOUNTER — Encounter: Payer: Self-pay | Admitting: Radiation Oncology

## 2015-06-21 ENCOUNTER — Ambulatory Visit: Payer: Medicare Other

## 2015-06-21 ENCOUNTER — Ambulatory Visit
Admission: RE | Admit: 2015-06-21 | Discharge: 2015-06-21 | Disposition: A | Discharge status: Home / Self Care | Payer: Medicare Other | Source: Ambulatory Visit | Attending: Radiation Oncology | Admitting: Radiation Oncology

## 2015-06-21 ENCOUNTER — Ambulatory Visit: Admission: RE | Admit: 2015-06-21 | Payer: Medicare Other | Source: Ambulatory Visit

## 2015-06-21 VITALS — BP 148/67 | HR 60 | Temp 97.9°F | Ht 63.0 in | Wt 145.0 lb

## 2015-06-21 DIAGNOSIS — C50312 Malignant neoplasm of lower-inner quadrant of left female breast: Secondary | ICD-10-CM

## 2015-06-21 NOTE — Progress Notes (Signed)
  Radiation Oncology         [phone] ________________________________  Name: [name] MRN: [id number]  Date: 06/21/2015  DOB: 04-Sep-1934  Weekly Radiation Therapy Management    ICD-9-CM ICD-10-CM   1. Breast cancer of lower-inner quadrant of left female breast ([facility]) 174.3 C50.312     Current Dose: 52.4 Gy     Planned Dose:  64.4 Gy  Narrative . . . . . . . . The patient presents for routine under treatment assessment.                                 [name] has completed 29 fractions to her left breast. She has taken a break in treatment since 06/16/15. She reports pain in her left breast as a 6/10.  She is finished with the medrol dosepak.  She is taking benadryl and using hydrocortisone cream and neosporin.  She has been applying neosporin to her upper left breast and back.                                 Set-up films were reviewed.                                 The chart was checked. Physical Findings. . .  height is '5\' 3"'$  (1.6 m) and weight is 145 lb (65.772 kg). Her oral temperature is 97.9 F (36.6 C). Her blood pressure is 148/67 and her pulse is 60.   Lungs are clear to auscultation bilaterally. Heart has regular rate and rhythm. Moist desquamation in the inframammary fold of the left breast. Diffuse erythema and dry desquamation throughout the treatment areas. Impression . . . . . . . The patient continues to have significant skin irritation. Plan . . . . . . . . . . . . We will hold off on radiation treatment and bring her back this Thursday for re-examination. The nurse will apply gentian violet to the intramammary fold and likely have re-application in 2 days. ________________________________   Blair Promise, PhD, MD  This document serves as a record of services personally performed by Gery Pray, MD. It was created on his behalf by Darcus Austin, a trained medical scribe. The creation of this record is based on the scribe's personal observations and the  provider's statements to them. This document has been checked and approved by the attending provider.

## 2015-06-21 NOTE — Progress Notes (Addendum)
[name] has completed 29 fractions to her left breast.  She reports pain in her left breast at a 6/10.  She is finished with the medrol Dosepak.  She is taking benadryl and using hydrocortisone cream and neosporin.  She has been applying neosporin to her upper breast and back.  The rash has faded to pink.  The skin on her left breast is red with peeling areas and moist desquamation under her breast.  She also has peeling on her left upper back.  BP 148/67 mmHg  Pulse 60  Temp(Src) 97.9 F (36.6 C) (Oral)  Ht '5\' 3"'$  (1.6 m)  Wt 145 lb (65.772 kg)  BMI 25.69 kg/m2

## 2015-06-21 NOTE — Progress Notes (Signed)
I applied Gentian Violet to the  area of [name]'s Left Breat. I also applied Gentian Violet to the Right Upper Quadrant of her Left Breast. I supplied her with dressings, mesh bra. She is aware to return to the clinic on Thursday at 4:00 for recheck and reapplication if necessary.

## 2015-06-22 ENCOUNTER — Ambulatory Visit
Admission: RE | Admit: 2015-06-22 | Discharge: 2015-06-22 | Disposition: A | Discharge status: Home / Self Care | Payer: Medicare Other | Source: Ambulatory Visit | Attending: Radiation Oncology | Admitting: Radiation Oncology

## 2015-06-22 ENCOUNTER — Ambulatory Visit: Payer: Medicare Other

## 2015-06-23 ENCOUNTER — Ambulatory Visit
Admission: RE | Admit: 2015-06-23 | Discharge: 2015-06-23 | Disposition: A | Discharge status: Home / Self Care | Payer: Medicare Other | Source: Ambulatory Visit | Attending: Radiation Oncology | Admitting: Radiation Oncology

## 2015-06-23 ENCOUNTER — Ambulatory Visit: Payer: Medicare Other

## 2015-06-23 ENCOUNTER — Ambulatory Visit: Admission: RE | Admit: 2015-06-23 | Payer: Medicare Other | Source: Ambulatory Visit

## 2015-06-23 DIAGNOSIS — C50312 Malignant neoplasm of lower-inner quadrant of left female breast: Secondary | ICD-10-CM

## 2015-06-23 NOTE — Progress Notes (Signed)
Applied gentian violet to desquamated area under her left breast.  Also applied small amount to desquamated area on her left upper back.

## 2015-06-23 NOTE — Progress Notes (Signed)
  Radiation Oncology         (336) [phone] ________________________________  Name: [name] MRN: [id number]  Date: 06/23/2015  DOB: 04-18-1934  Weekly Radiation Therapy Management    ICD-9-CM ICD-10-CM   1. Breast cancer of lower-inner quadrant of left female breast (HCC) 174.3 C50.312     Current Dose: 52.4 Gy     Planned Dose:  64.4 Gy  Narrative . . . . . . . . The patient presents for routine under treatment assessment.                                 [name] has completed 29 fractions to her left breast. She has taken a break in treatment since 06/16/15. Earlier this week, Gentian Violet was applied to the areas of moist desquamation. The patient returns for re-evaluation.                                 Set-up films were reviewed.                                 The chart was checked. Physical Findings. . .  Patient's continues to have moist desquamation in the left inframammary fold, but does have new islands of skin growth. Some areas of dry desquamation of the left breast and left upper back. Some erythema noted. Impression . . . . . . . The patient continues to have significant skin reaction. Plan . . . . . . . . . . . . We will continue holding off on radiation treatment. The nurse will apply gentian violet to the intramammary fold once again today. The patient will return next Tuesday for a re-examination before treatment to determine if she's ready to resume. ________________________________   Blair Promise, PhD, MD  This document serves as a record of services personally performed by Gery Pray, MD. It was created on his behalf by Darcus Austin, a trained medical scribe. The creation of this record is based on the scribe's personal observations and the provider's statements to them. This document has been checked and approved by the attending provider.

## 2015-06-24 ENCOUNTER — Other Ambulatory Visit: Payer: Self-pay | Admitting: Adult Health

## 2015-06-24 ENCOUNTER — Ambulatory Visit: Payer: Medicare Other

## 2015-06-27 ENCOUNTER — Ambulatory Visit: Payer: Medicare Other

## 2015-06-28 ENCOUNTER — Ambulatory Visit: Payer: Medicare Other

## 2015-06-28 ENCOUNTER — Encounter: Payer: Self-pay | Admitting: Radiation Oncology

## 2015-06-28 ENCOUNTER — Ambulatory Visit: Admission: RE | Admit: 2015-06-28 | Payer: Medicare Other | Source: Ambulatory Visit

## 2015-06-28 ENCOUNTER — Ambulatory Visit
Admission: RE | Admit: 2015-06-28 | Discharge: 2015-06-28 | Disposition: A | Discharge status: Home / Self Care | Payer: Medicare Other | Source: Ambulatory Visit | Attending: Radiation Oncology | Admitting: Radiation Oncology

## 2015-06-28 VITALS — BP 175/57 | HR 48 | Temp 97.6°F | Ht 63.0 in | Wt 147.6 lb

## 2015-06-28 DIAGNOSIS — J439 Emphysema, unspecified: Secondary | ICD-10-CM | POA: Insufficient documentation

## 2015-06-28 DIAGNOSIS — F329 Major depressive disorder, single episode, unspecified: Secondary | ICD-10-CM | POA: Insufficient documentation

## 2015-06-28 DIAGNOSIS — E039 Hypothyroidism, unspecified: Secondary | ICD-10-CM | POA: Insufficient documentation

## 2015-06-28 DIAGNOSIS — Z86010 Personal history of colonic polyps: Secondary | ICD-10-CM | POA: Insufficient documentation

## 2015-06-28 DIAGNOSIS — F419 Anxiety disorder, unspecified: Secondary | ICD-10-CM | POA: Insufficient documentation

## 2015-06-28 DIAGNOSIS — R0600 Dyspnea, unspecified: Secondary | ICD-10-CM | POA: Insufficient documentation

## 2015-06-28 DIAGNOSIS — Z510 Encounter for antineoplastic radiation therapy: Secondary | ICD-10-CM | POA: Insufficient documentation

## 2015-06-28 DIAGNOSIS — G629 Polyneuropathy, unspecified: Secondary | ICD-10-CM | POA: Insufficient documentation

## 2015-06-28 DIAGNOSIS — J449 Chronic obstructive pulmonary disease, unspecified: Secondary | ICD-10-CM | POA: Insufficient documentation

## 2015-06-28 DIAGNOSIS — I4891 Unspecified atrial fibrillation: Secondary | ICD-10-CM | POA: Insufficient documentation

## 2015-06-28 DIAGNOSIS — I10 Essential (primary) hypertension: Secondary | ICD-10-CM | POA: Insufficient documentation

## 2015-06-28 DIAGNOSIS — M1990 Unspecified osteoarthritis, unspecified site: Secondary | ICD-10-CM | POA: Insufficient documentation

## 2015-06-28 DIAGNOSIS — Z170 Estrogen receptor positive status [ER+]: Secondary | ICD-10-CM | POA: Insufficient documentation

## 2015-06-28 DIAGNOSIS — M797 Fibromyalgia: Secondary | ICD-10-CM | POA: Insufficient documentation

## 2015-06-28 DIAGNOSIS — Z87891 Personal history of nicotine dependence: Secondary | ICD-10-CM | POA: Insufficient documentation

## 2015-06-28 DIAGNOSIS — C50312 Malignant neoplasm of lower-inner quadrant of left female breast: Secondary | ICD-10-CM | POA: Insufficient documentation

## 2015-06-28 DIAGNOSIS — E785 Hyperlipidemia, unspecified: Secondary | ICD-10-CM | POA: Insufficient documentation

## 2015-06-28 NOTE — Progress Notes (Signed)
Applied gentian violet to patient's left outer breast and underneath her breast.  Patient was provided with more telfa pads.

## 2015-06-28 NOTE — Progress Notes (Signed)
[name] here for follow up.  She denies having pain.  She reports her energy level is better.  She does report falling today on her right knee outside.  Her heart rate today was 48.   She has been using radiaplex.  The skin on her left breast is red.  She has scabbed and peeling areas on her left upper back and left neck/chest.  The skin on the side of her breast and under her breast has moist desquamation.  BP 175/57 mmHg  Pulse 48  Temp(Src) 97.6 F (36.4 C) (Oral)  Ht '5\' 3"'$  (1.6 m)  Wt 147 lb 9.6 oz (66.951 kg)  BMI 26.15 kg/m2  SpO2 99%   Wt Readings from Last 3 Encounters:  06/28/15 147 lb 9.6 oz (66.951 kg)  06/21/15 145 lb (65.772 kg)  06/16/15 148 lb (67.132 kg)

## 2015-06-28 NOTE — Addendum Note (Signed)
Encounter addended by: Jacqulyn Liner, RN on: 06/28/2015  5:23 PM<BR>     Documentation filed: Notes Section

## 2015-06-28 NOTE — Progress Notes (Signed)
  Radiation Oncology         (336) [phone] ________________________________  Name: [name] MRN: [id number]  Date: 06/28/2015  DOB: [birth year]  Weekly Radiation Therapy Management    ICD-9-CM ICD-10-CM   1. Breast cancer of lower-inner quadrant of left female breast (HCC) 174.3 C50.312     Current Dose: 52.4 Gy     Planned Dose:  64.4 Gy  Narrative . . . . . . . . The patient presents for routine under treatment assessment.                                 [name] has completed 29 fractions to her left breast. She has taken a break in treatment since 06/16/15. She denies having pain. She reports her energy level is better. She reports falling today on her right knee outside. Her heart rate today was 48. She has been using radiaplex.                                 Set-up films were reviewed.                                 The chart was checked. Physical Findings. [name] Danley Danker Vitals:   06/28/15 1524  BP: 175/57  Pulse: 48  Temp: 97.6 F (36.4 C)  TempSrc: Oral  Height: '5\' 3"'$  (1.6 m)  Weight: 147 lb 9.6 oz (66.951 kg)  SpO2: 99%   Patient's continues to have moist desquamation in the left inframammary fold. It is improving. Other areas are healing well. Impression . . . . . . . The patient continues to have a significant skin reaction, but it has improved. Plan . . . . . . . . . . . . We will continue holding off on radiation treatment today. The nurse will apply gentian violet to the inframammary fold once again today. The patient will return tomorrow to resume her treatment. ________________________________   Blair Promise, PhD, MD  This document serves as a record of services personally performed by Gery Pray, MD. It was created on his behalf by Darcus Austin, a trained medical scribe. The creation of this record is based on the scribe's personal observations and the provider's statements to them. This document has been checked and approved by the attending provider.

## 2015-06-29 ENCOUNTER — Ambulatory Visit: Payer: Medicare Other

## 2015-06-30 ENCOUNTER — Ambulatory Visit
Admission: RE | Admit: 2015-06-30 | Discharge: 2015-06-30 | Disposition: A | Discharge status: Home / Self Care | Payer: Medicare Other | Source: Ambulatory Visit | Attending: Radiation Oncology | Admitting: Radiation Oncology

## 2015-06-30 ENCOUNTER — Ambulatory Visit: Payer: Medicare Other

## 2015-06-30 DIAGNOSIS — I4891 Unspecified atrial fibrillation: Secondary | ICD-10-CM | POA: Diagnosis not present

## 2015-06-30 DIAGNOSIS — G629 Polyneuropathy, unspecified: Secondary | ICD-10-CM | POA: Diagnosis not present

## 2015-06-30 DIAGNOSIS — R0600 Dyspnea, unspecified: Secondary | ICD-10-CM | POA: Diagnosis not present

## 2015-06-30 DIAGNOSIS — M797 Fibromyalgia: Secondary | ICD-10-CM | POA: Diagnosis not present

## 2015-06-30 DIAGNOSIS — Z170 Estrogen receptor positive status [ER+]: Secondary | ICD-10-CM | POA: Diagnosis not present

## 2015-06-30 DIAGNOSIS — I10 Essential (primary) hypertension: Secondary | ICD-10-CM | POA: Diagnosis not present

## 2015-06-30 DIAGNOSIS — M1990 Unspecified osteoarthritis, unspecified site: Secondary | ICD-10-CM | POA: Diagnosis not present

## 2015-06-30 DIAGNOSIS — E785 Hyperlipidemia, unspecified: Secondary | ICD-10-CM | POA: Diagnosis not present

## 2015-06-30 DIAGNOSIS — Z87891 Personal history of nicotine dependence: Secondary | ICD-10-CM | POA: Diagnosis not present

## 2015-06-30 DIAGNOSIS — J439 Emphysema, unspecified: Secondary | ICD-10-CM | POA: Diagnosis not present

## 2015-06-30 DIAGNOSIS — Z510 Encounter for antineoplastic radiation therapy: Secondary | ICD-10-CM | POA: Diagnosis not present

## 2015-06-30 DIAGNOSIS — Z86010 Personal history of colonic polyps: Secondary | ICD-10-CM | POA: Diagnosis not present

## 2015-06-30 DIAGNOSIS — E039 Hypothyroidism, unspecified: Secondary | ICD-10-CM | POA: Diagnosis not present

## 2015-06-30 DIAGNOSIS — J449 Chronic obstructive pulmonary disease, unspecified: Secondary | ICD-10-CM | POA: Diagnosis not present

## 2015-06-30 DIAGNOSIS — C50312 Malignant neoplasm of lower-inner quadrant of left female breast: Secondary | ICD-10-CM | POA: Diagnosis present

## 2015-06-30 DIAGNOSIS — F419 Anxiety disorder, unspecified: Secondary | ICD-10-CM | POA: Diagnosis not present

## 2015-06-30 DIAGNOSIS — F329 Major depressive disorder, single episode, unspecified: Secondary | ICD-10-CM | POA: Diagnosis not present

## 2015-07-01 ENCOUNTER — Ambulatory Visit
Admission: RE | Admit: 2015-07-01 | Discharge: 2015-07-01 | Disposition: A | Discharge status: Home / Self Care | Payer: Medicare Other | Source: Ambulatory Visit | Attending: Radiation Oncology | Admitting: Radiation Oncology

## 2015-07-01 ENCOUNTER — Ambulatory Visit: Payer: Medicare Other

## 2015-07-01 DIAGNOSIS — Z510 Encounter for antineoplastic radiation therapy: Secondary | ICD-10-CM | POA: Diagnosis not present

## 2015-07-04 ENCOUNTER — Ambulatory Visit
Admission: RE | Admit: 2015-07-04 | Discharge: 2015-07-04 | Disposition: A | Discharge status: Home / Self Care | Payer: Medicare Other | Source: Ambulatory Visit | Attending: Radiation Oncology | Admitting: Radiation Oncology

## 2015-07-04 ENCOUNTER — Ambulatory Visit: Payer: Medicare Other

## 2015-07-04 ENCOUNTER — Ambulatory Visit: Payer: Medicare Other | Admitting: Hematology and Oncology

## 2015-07-04 DIAGNOSIS — Z510 Encounter for antineoplastic radiation therapy: Secondary | ICD-10-CM | POA: Diagnosis not present

## 2015-07-05 ENCOUNTER — Encounter: Payer: Self-pay | Admitting: Hematology and Oncology

## 2015-07-05 ENCOUNTER — Encounter: Payer: Self-pay | Admitting: Radiation Oncology

## 2015-07-05 ENCOUNTER — Telehealth: Payer: Self-pay | Admitting: Hematology and Oncology

## 2015-07-05 ENCOUNTER — Ambulatory Visit
Admission: RE | Admit: 2015-07-05 | Discharge: 2015-07-05 | Disposition: A | Discharge status: Home / Self Care | Payer: Medicare Other | Source: Ambulatory Visit | Attending: Radiation Oncology | Admitting: Radiation Oncology

## 2015-07-05 ENCOUNTER — Ambulatory Visit: Payer: Medicare Other

## 2015-07-05 ENCOUNTER — Ambulatory Visit (HOSPITAL_BASED_OUTPATIENT_CLINIC_OR_DEPARTMENT_OTHER): Payer: Medicare Other | Admitting: Hematology and Oncology

## 2015-07-05 VITALS — BP 141/65 | HR 59 | Temp 97.7°F | Resp 18 | Ht 63.0 in | Wt 144.4 lb

## 2015-07-05 VITALS — BP 146/59 | HR 51 | Temp 97.9°F | Ht 63.0 in | Wt 144.1 lb

## 2015-07-05 DIAGNOSIS — C50312 Malignant neoplasm of lower-inner quadrant of left female breast: Secondary | ICD-10-CM | POA: Diagnosis not present

## 2015-07-05 DIAGNOSIS — Z510 Encounter for antineoplastic radiation therapy: Secondary | ICD-10-CM | POA: Diagnosis not present

## 2015-07-05 NOTE — Progress Notes (Signed)
Weekly Management Note Current Dose:  60.4 Gy  Projected Dose:  64.4 Gy   Narrative:  The patient presents for routine under treatment assessment.  CBCT/MVCT images/Port film x-rays were reviewed.  The chart was checked.  Margorie Renner has completed 33 fractions to her left breast. She denies having pain or fatigue. She has been using hydrocortisone cream. Advised her to start using radiaplex again. She also has had gentian violet under her left breast which was last applied on Friday. The skin on her left breast is red with a small scabbed area on her upper left chest and another scabbed area on the side of her breast. The skin under her left breast has moist desquamation. She reports taping her breast up over the weekend with paper tape. Advised her not to use tape on her breast because of potential skin tears. Advised her to use a Telfa pad instead of taping her breast up.  Physical Findings: Weight: 144 lb 1.6 oz (65.363 kg). She has some moist desquamation in the inframammary folds in the lower left breast and some healing dry desquamation in the upper outer quadrant.  Impression:  The patient is tolerating radiation.  Plan:  Continue treatment as planned. Continues using Radiaplex. She finishes treatment 07/07/2015. She has a follow up appointment with Dr. Sondra Come 08/18/2015. She will have a Survivorship appointment scheduled. Reapply genetian violet today and prn.     ------------------------------------------------  Thea Silversmith, MD    This document serves as a record of services personally performed by Thea Silversmith, MD. It was created on her behalf by  Lendon Collar, a trained medical scribe. The creation of this record is based on the scribe's personal observations and the provider's statements to them. This document has been checked and approved by the attending provider.

## 2015-07-05 NOTE — Progress Notes (Signed)
Patient Care Team: [name], MD as PCP - General (Internal Medicine) Alphonsa Overall, MD as Consulting Physician (General Surgery) Nicholas Lose, MD as Consulting Physician (Hematology and Oncology) Gery Pray, MD as Consulting Physician (Radiation Oncology) Sylvan Cheese, NP as Nurse Practitioner (Hematology and Oncology)  DIAGNOSIS: Breast cancer of lower-inner quadrant of left female breast [facility])   Staging form: Breast, AJCC 7th Edition     Clinical stage from 02/16/2015: Stage IIA (T1c, N1, M0) - Unsigned   SUMMARY OF ONCOLOGIC HISTORY:   Breast cancer of lower-inner quadrant of left female breast ([place])   01/24/2015 Mammogram Area of palpable concern left breast lower inner quadrant focal skin thickening with architectural distortion, ultrasound showed 8:30: 1.1 x 1.7 x 0.9 cm abnormality, left axilla to pathologic axillary lymph nodes cortical thickening 1.3 cm, 9 mm   02/07/2015 Initial Diagnosis Left breast biopsy 8:00: IDC with DCIS grade 2-3, ER 95%, PR 60%, Ki-67 15%, HER-2 negative ratio 1.31; left axillary lymph node biopsy positive for metastatic carcinoma with extracapsular extension   03/22/2015 Surgery Left lumpectomy: Invasive and in situ lobular carcinoma grade 2, 2 cm, LVI present, posterior lateral and anterior margins broadly involved, 12/12 LN positive with ECE, ER 95%, PR 60%, HER-2 neg ratio 1.31, Ki-67 15%, T3 N3 (stage 3C)   03/31/2015 Imaging CT CAP and bone scan Postsurgical changes suspected residual enhancing soft tissue in the anterior/lateral left breast worrisome for residual tumor , associated left supraclavicular, subpectoral and left axillary LN, small hilar LN, small lung nodules   05/06/2015 -  Radiation Therapy Adjuvant radiation therapy with Dr. Sondra Come    CHIEF COMPLIANT: radiation dermatitis  INTERVAL HISTORY: [name] is a 81 year old with above-mentioned history of left breast cancer stage IIIc disease who is currently undergoing  adjuvant radiation therapy. She will finish radiation the next couple of days. She is having a lot of soreness in the breast from radiation dermatitis. She does appear to have fatigue as well from radiation.  REVIEW OF SYSTEMS:   Constitutional: Denies fevers, chills or abnormal weight loss Eyes: Denies blurriness of vision Ears, nose, mouth, throat, and face: Denies mucositis or sore throat Respiratory: Denies cough, dyspnea or wheezes Cardiovascular: Denies palpitation, chest discomfort Gastrointestinal:  Denies nausea, heartburn or change in bowel habits Skin: Denies abnormal skin rashes Lymphatics: Denies new lymphadenopathy or easy bruising Neurological:Denies numbness, tingling or new weaknesses Behavioral/Psych: Mood is stable, no new changes  Extremities: No lower extremity edema Breast:radiation dermatitis underneath the left breast All other systems were reviewed with the patient and are negative.  I have reviewed the past medical history, past surgical history, social history and family history with the patient and they are unchanged from previous note.  ALLERGIES:  is allergic to codeine; penicillins; and silvadene.  MEDICATIONS:  Current Outpatient Prescriptions  Medication Sig Dispense Refill  . acetaminophen (TYLENOL) 325 MG tablet Take 650 mg by mouth every 6 (six) hours as needed. Reported on 04/20/2015    . albuterol (PROAIR HFA) 108 (90 BASE) MCG/ACT inhaler Inhale 2 puffs into the lungs every 4 (four) hours as needed. For shortness of breath    . amiodarone (PACERONE) 200 MG tablet Take 100 mg by mouth daily.     [name] amLODipine (NORVASC) 2.5 MG tablet Take 2.5 mg by mouth daily.    [name] anastrozole (ARIMIDEX) 1 MG tablet Take 1 tablet (1 mg total) by mouth daily. (Patient not taking: Reported on 05/10/2015) 90 tablet 3  . atorvastatin (LIPITOR) 10  MG tablet Take 10 mg by mouth daily.   2  . calcium carbonate (OS-CAL) 600 MG TABS tablet Take 600 mg by mouth daily with  breakfast.    . diphenhydrAMINE (BENADRYL) 25 mg capsule Take 25 mg by mouth every 6 (six) hours. Use every 6 hours until rash improves.  Disp. 25 tablets. O refills.  Called in to [facility] on 06/16/15.    . furosemide (LASIX) 40 MG tablet Take 40 mg by mouth daily. Reported on 06/28/2015    . hyaluronate sodium (RADIAPLEXRX) GEL Apply 1 application topically 2 (two) times daily. Reported on 06/21/2015    . levothyroxine (SYNTHROID, LEVOTHROID) 137 MCG tablet Take 137 mcg by mouth daily before breakfast.    . LORazepam (ATIVAN) 0.5 MG tablet Take 0.5 mg by mouth 2 (two) times daily as needed.      [name] losartan (COZAAR) 100 MG tablet Take 100 mg by mouth daily.    . magnesium oxide (MAG-OX) 400 MG tablet Take 400 mg by mouth 2 (two) times daily.      . methylPREDNISolone (MEDROL DOSEPAK) 4 MG TBPK tablet Take by mouth. Reported on 06/28/2015    . mometasone-formoterol (DULERA) 100-5 MCG/ACT AERO Inhale 2 puffs into the lungs 2 (two) times daily. 1 Inhaler 6  . Multiple Vitamin (MULTIVITAMIN) tablet Take 1 tablet by mouth daily.    . non-metallic deodorant Jethro Poling) MISC Apply 1 application topically daily as needed.    . Rivaroxaban (XARELTO) 20 MG TABS Take 1 tablet (20 mg total) by mouth daily with supper. 30 tablet 2  . spironolactone (ALDACTONE) 25 MG tablet Take 25 mg by mouth daily.   2  . tiotropium (SPIRIVA) 18 MCG inhalation capsule Place 1 capsule (18 mcg total) into inhaler and inhale daily. 10 capsule 0  . vitamin B-12 (CYANOCOBALAMIN) 1000 MCG tablet Take 1,000 mcg by mouth daily.    . Wound Dressings (SONAFINE EX) Apply topically. Reported on 06/28/2015    . [DISCONTINUED] digoxin (LANOXIN) 0.125 MG tablet Take 1 tablet by mouth Daily.     No current facility-administered medications for this visit.    PHYSICAL EXAMINATION: ECOG PERFORMANCE STATUS: 1 - Symptomatic but completely ambulatory  Filed Vitals:   07/05/15 0917  BP: 141/65  Pulse: 59  Temp: 97.7 F (36.5 C)  Resp: 18   Filed  Weights   07/05/15 0917  Weight: 144 lb 6.4 oz (65.499 kg)    GENERAL:alert, no distress and comfortable SKIN: skin color, texture, turgor are normal, no rashes or significant lesions EYES: normal, Conjunctiva are pink and non-injected, sclera clear OROPHARYNX:no exudate, no erythema and lips, buccal mucosa, and tongue normal  NECK: supple, thyroid normal size, non-tender, without nodularity LYMPH:  no palpable lymphadenopathy in the cervical, axillary or inguinal LUNGS: clear to auscultation and percussion with normal breathing effort HEART: regular rate & rhythm and no murmurs and no lower extremity edema ABDOMEN:abdomen soft, non-tender and normal bowel sounds MUSCULOSKELETAL:no cyanosis of digits and no clubbing  NEURO: alert & oriented x 3 with fluent speech, no focal motor/sensory deficits EXTREMITIES: No lower extremity edema  LABORATORY DATA:  I have reviewed the data as listed   Chemistry      Component Value Date/Time   NA 140 03/16/2015 1330   NA 137 02/16/2015 0854   K 4.4 03/16/2015 1330   K 3.9 02/16/2015 0854   CL 102 03/16/2015 1330   CO2 27 03/16/2015 1330   CO2 24 02/16/2015 0854   BUN 19 03/16/2015 1330  BUN 20.3 02/16/2015 0854   CREATININE 1.25* 03/16/2015 1330   CREATININE 1.0 02/16/2015 0854      Component Value Date/Time   CALCIUM 9.5 03/16/2015 1330   CALCIUM 9.5 02/16/2015 0854   ALKPHOS 50 03/16/2015 1330   ALKPHOS 62 02/16/2015 0854   AST 25 03/16/2015 1330   AST 17 02/16/2015 0854   ALT 22 03/16/2015 1330   ALT 15 02/16/2015 0854   BILITOT 0.4 03/16/2015 1330   BILITOT 0.32 02/16/2015 0854       Lab Results  Component Value Date   WBC 10.7* 03/16/2015   HGB 13.7 03/16/2015   HCT 41.8 03/16/2015   MCV 99.5 03/16/2015   PLT 322 03/16/2015   NEUTROABS 8.3* 03/16/2015     ASSESSMENT & PLAN:  Breast cancer of lower-inner quadrant of left female breast ([place]) Left lumpectomy 03/22/2015: Invasive and in situ lobular carcinoma grade  2, 6 cm, LVI present, posterior lateral and anterior margins broadly involved, 12/12 LN positive with ECE, ER 95%, PR 60%, HER-2 neg ratio 1.31, Ki-67 15%, T3 N3 (stage 3C) Adjuvant radiation therapy started 05/06/2015  (CT of the chest abdomen and pelvis and a bone scan 03/31/2015: Postsurgical changes suspected residual enhancing soft tissue in the anterior/lateral left breast worrisome for residual tumor , associated left supraclavicular, subpectoral and left axillary nodal metastases , additional small thoracic lymph nodes are indeterminate , scattered small pulmonary nodules measuring up to 6 mm indeterminate Bone scan: No bone metastases)  Recommendation: Once radiation is complete we will start her on antiestrogen therapy with anastrozole approximately June 1. She had previously taken anastrozole prior to her surgery.  I would like to obtain a CT of her chest in 3 months before her next visit. Return to clinic in 3 months for follow-up and toxicity check on anastrozole.   No orders of the defined types were placed in this encounter.   The patient has a good understanding of the overall plan. she agrees with it. she will call with any problems that may develop before the next visit here.   Rulon Eisenmenger, MD 07/05/2015

## 2015-07-05 NOTE — Progress Notes (Signed)
[name] has completed 33 fractions to her left breast.  She denies having pain or fatigue.  She has been using hydrocortisone cream.  Advised her to start using radiaplex again.  She also has had gentian violet under her left breast which was last applied on Friday.  The skin on her left breast is red with a small scabbed area on her upper left chest and another scabbed area on the side of her breast.  The skin under her left breast has moist desquamation.  She reports taping her breast up over the weekend with paper tape.  Advised her not to use tape on her breast because of potential skin tears.  Advised her to use a Telfa pad instead of taping her breast up.  BP 146/59 mmHg  Pulse 51  Temp(Src) 97.9 F (36.6 C) (Oral)  Ht '5\' 3"'$  (1.6 m)  Wt 144 lb 1.6 oz (65.363 kg)  BMI 25.53 kg/m2   Wt Readings from Last 3 Encounters:  07/05/15 144 lb 1.6 oz (65.363 kg)  07/05/15 144 lb 6.4 oz (65.499 kg)  06/28/15 147 lb 9.6 oz (66.951 kg)

## 2015-07-05 NOTE — Progress Notes (Signed)
Applied gentian violet under patient's left breast. Advised her to come back to nursing on Thursday so that it can be reapplied.

## 2015-07-05 NOTE — Telephone Encounter (Signed)
appt made and avs printed °

## 2015-07-05 NOTE — Assessment & Plan Note (Signed)
Left lumpectomy 03/22/2015: Invasive and in situ lobular carcinoma grade 2, 6 cm, LVI present, posterior lateral and anterior margins broadly involved, 12/12 LN positive with ECE, ER 95%, PR 60%, HER-2 neg ratio 1.31, Ki-67 15%, T3 N3 (stage 3C) Adjuvant radiation therapy started 05/06/2015  (CT of the chest abdomen and pelvis and a bone scan 03/31/2015: Postsurgical changes suspected residual enhancing soft tissue in the anterior/lateral left breast worrisome for residual tumor , associated left supraclavicular, subpectoral and left axillary nodal metastases , additional small thoracic lymph nodes are indeterminate , scattered small pulmonary nodules measuring up to 6 mm indeterminate Bone scan: No bone metastases)  Recommendation: Once radiation is complete we will start her on antiestrogen therapy with anastrozole. She had previously taken anastrozole prior to her surgery.  I would like to obtain a CT of her chest abdomen and pelvis in 3 months before her next visit. Return to clinic in 3 months for follow-up and toxicity check on anastrozole.

## 2015-07-06 ENCOUNTER — Ambulatory Visit: Payer: Medicare Other

## 2015-07-06 ENCOUNTER — Ambulatory Visit
Admission: RE | Admit: 2015-07-06 | Discharge: 2015-07-06 | Disposition: A | Discharge status: Home / Self Care | Payer: Medicare Other | Source: Ambulatory Visit | Attending: Radiation Oncology | Admitting: Radiation Oncology

## 2015-07-06 DIAGNOSIS — Z510 Encounter for antineoplastic radiation therapy: Secondary | ICD-10-CM | POA: Diagnosis not present

## 2015-07-07 ENCOUNTER — Encounter: Payer: Self-pay | Admitting: Radiation Oncology

## 2015-07-07 ENCOUNTER — Ambulatory Visit
Admission: RE | Admit: 2015-07-07 | Discharge: 2015-07-07 | Disposition: A | Discharge status: Home / Self Care | Payer: Medicare Other | Source: Ambulatory Visit | Attending: Radiation Oncology | Admitting: Radiation Oncology

## 2015-07-07 DIAGNOSIS — Z510 Encounter for antineoplastic radiation therapy: Secondary | ICD-10-CM | POA: Diagnosis not present

## 2015-07-07 NOTE — Progress Notes (Signed)
Patient has finished treatment to her left breast.  She continues to have an area of moist desquamation under her left breast.  Applied gentian violet to the area.  Advised patient to call if she has any questions or issues before her one month follow up.

## 2015-07-08 ENCOUNTER — Telehealth: Payer: Self-pay | Admitting: *Deleted

## 2015-07-08 NOTE — Telephone Encounter (Signed)
Spoke with patient to follow up after radiation completion.  She is doing well.  Discussed survivorship.  Encouraged her to call with any needs or concerns.

## 2015-08-02 NOTE — Progress Notes (Signed)
  Radiation Oncology         (336) [phone] ________________________________  Name: [name] MRN: [id number]  Date: 07/07/2015  DOB: [birth year]  End of Treatment Note   ICD-9-CM ICD-10-CM    1. Breast cancer of lower-inner quadrant of left female breast ([place]) 174.3 C50.312      DIAGNOSIS: Breast cancer of lower-inner quadrant of left female breast, pT3, N3a with positive margins     Indication for treatment: Curative  Radiation treatment dates:   05/05/2015-07/07/2015  Site/dose: Left Breast: 50.4 Gy in 28 fractions         Left Breast Boost: 14 Gy in 7 fractions         Left Axilla/SCF: 50.4 Gy in 28 fractions  Beams/energy: Left Breast: 3D-Conformal Other/ 6X Photon       Left Breast Boost: Electron Monte Carlo/ 18MeV Electron       Left Axilla/SCF: Obliques/ 10X, 6X Photon  Narrative: During treatment, the patient experienced breast pain, brisk erythema throughout the left breast, moist desquamation in the left inframammary fold, and a macular rash extending outside of the radiation field and into the abdomen and neck region. Radiation treatment was held from 06/15/15 to 06/29/15. Gentian violet was applied to the inframammary fold of the left breast.  Plan: The patient has completed radiation treatment. The patient will return to radiation oncology clinic for routine followup in one month. I advised them to call or return sooner if they have any questions or concerns related to their recovery or treatment.  -----------------------------------  Blair Promise, PhD, MD  This document serves as a record of services personally performed by Gery Pray, MD. It was created on his behalf by Darcus Austin, a trained medical scribe. The creation of this record is based on the scribe's personal observations and the provider's statements to them. This document has been checked and approved by the attending provider.

## 2015-08-15 ENCOUNTER — Encounter: Payer: Self-pay | Admitting: Oncology

## 2015-08-18 ENCOUNTER — Encounter: Payer: Self-pay | Admitting: Radiation Oncology

## 2015-08-18 ENCOUNTER — Ambulatory Visit
Admission: RE | Admit: 2015-08-18 | Discharge: 2015-08-18 | Disposition: A | Discharge status: Home / Self Care | Payer: Medicare Other | Source: Ambulatory Visit | Attending: Radiation Oncology | Admitting: Radiation Oncology

## 2015-08-18 VITALS — BP 147/77 | HR 61 | Temp 97.6°F | Resp 16 | Ht 63.0 in | Wt 142.8 lb

## 2015-08-18 DIAGNOSIS — C50312 Malignant neoplasm of lower-inner quadrant of left female breast: Secondary | ICD-10-CM | POA: Diagnosis present

## 2015-08-18 DIAGNOSIS — Z888 Allergy status to other drugs, medicaments and biological substances status: Secondary | ICD-10-CM | POA: Diagnosis not present

## 2015-08-18 DIAGNOSIS — Z923 Personal history of irradiation: Secondary | ICD-10-CM | POA: Diagnosis not present

## 2015-08-18 DIAGNOSIS — Z880 Allergy status to penicillin: Secondary | ICD-10-CM | POA: Insufficient documentation

## 2015-08-18 DIAGNOSIS — Z79899 Other long term (current) drug therapy: Secondary | ICD-10-CM | POA: Diagnosis not present

## 2015-08-18 NOTE — Addendum Note (Signed)
Encounter addended by: Jacqulyn Liner, RN on: 08/18/2015  1:03 PM<BR>     Documentation filed: Charges VN

## 2015-08-18 NOTE — Progress Notes (Signed)
Mrs [name]. is here for a one month  follow up visit for breast cancer of lower-inner quadrant of left female breast.  Skin status:Left breast with normal color. Lotion being used:None  Have you seen your medical oncologist? Date If not ,when is appointment 11-14-15 Dr. Lindi Adie When is yiur next mammogram? Has it been scheduled ? : Not scheduled yet ER+,have started AI or Tamoxifen? If not, why? 07-28-15 Anastrozole Discuss survivorship appointment:     Chestine Spore Not sheduled Offer referral reading material for Survivorship, [employer] and [facility] given  Will give at time of appointment. Appetite:Good Pain:None, Having a numb feeling to upper arm. Arm mobility:Able to raise left arm without discomfort. Fatigue:None BP 147/77 mmHg  Pulse 61  Temp(Src) 97.6 F (36.4 C) (Oral)  Resp 16  Ht '5\' 3"'$  (1.6 m)  Wt 142 lb 12.8 oz (64.774 kg)  BMI 25.30 kg/m2  SpO2 97%

## 2015-08-18 NOTE — Progress Notes (Signed)
Radiation Oncology         [phone] ________________________________  Name: [name] MRN: [id number]  Date: 08/18/2015  DOB: [birth year]  Follow-Up Visit Note  CC: [name], MD  Nicholas Lose, MD    ICD-9-CM ICD-10-CM   1. Breast cancer of lower-inner quadrant of left female breast ([place]) 174.3 C50.312     Diagnosis: Breast cancer of lower-inner quadrant of left female breast, pT3, N3a with positive margins  Interval Since Last Radiation: 5 weeks  05/05/2015-07/07/2015: Left Breast: 50.4 Gy in 28 fractions           Left Breast Boost: 14 Gy in 7 fractions (64.4)           Left Axilla/SCF: 50.4 Gy in 28 fractions  Narrative:  The patient returns today for routine follow-up. The patient started Anastrozole on 07/28/15. She reports numbness in her left upper arm and is able to raise her left arm without discomfort. She reports a good appetite and no fatigue.  No Pain in the breast area nipple discharge or bleeding.  ALLERGIES:  is allergic to codeine; penicillins; and silvadene.  Meds: Current Outpatient Prescriptions  Medication Sig Dispense Refill  . acetaminophen (TYLENOL) 325 MG tablet Take 650 mg by mouth every 6 (six) hours as needed. Reported on 04/20/2015    . albuterol (PROAIR HFA) 108 (90 BASE) MCG/ACT inhaler Inhale 2 puffs into the lungs every 4 (four) hours as needed. For shortness of breath    . amiodarone (PACERONE) 200 MG tablet Take 100 mg by mouth daily.     [name] amLODipine (NORVASC) 2.5 MG tablet Take 2.5 mg by mouth daily.    [name] anastrozole (ARIMIDEX) 1 MG tablet Take 1 tablet (1 mg total) by mouth daily. 90 tablet 3  . atorvastatin (LIPITOR) 10 MG tablet Take 10 mg by mouth daily.   2  . furosemide (LASIX) 40 MG tablet Take 40 mg by mouth daily. Reported on 06/28/2015    . levothyroxine (SYNTHROID, LEVOTHROID) 137 MCG tablet Take 137 mcg by mouth daily before breakfast.    . LORazepam (ATIVAN) 0.5 MG tablet Take 0.5 mg by mouth 2 (two) times daily as needed.        [name] losartan (COZAAR) 100 MG tablet Take 100 mg by mouth daily.    . magnesium oxide (MAG-OX) 400 MG tablet Take 400 mg by mouth 2 (two) times daily.      . mometasone-formoterol (DULERA) 100-5 MCG/ACT AERO Inhale 2 puffs into the lungs 2 (two) times daily. 1 Inhaler 6  . Multiple Vitamin (MULTIVITAMIN) tablet Take 1 tablet by mouth daily.    . Rivaroxaban (XARELTO) 20 MG TABS Take 1 tablet (20 mg total) by mouth daily with supper. 30 tablet 2  . spironolactone (ALDACTONE) 25 MG tablet Take 25 mg by mouth daily.   2  . tiotropium (SPIRIVA) 18 MCG inhalation capsule Place 1 capsule (18 mcg total) into inhaler and inhale daily. 10 capsule 0  . calcium carbonate (OS-CAL) 600 MG TABS tablet Take 600 mg by mouth daily with breakfast. Reported on 08/18/2015    . vitamin B-12 (CYANOCOBALAMIN) 1000 MCG tablet Take 1,000 mcg by mouth daily. Reported on 08/18/2015    . [DISCONTINUED] digoxin (LANOXIN) 0.125 MG tablet Take 1 tablet by mouth Daily.     No current facility-administered medications for this encounter.    Physical Findings: The patient is in no acute distress. Patient is alert and oriented.  height is '5\' 3"'$  (1.6 m) and weight  is 142 lb 12.8 oz (64.774 kg). Her oral temperature is 97.6 F (36.4 C). Her blood pressure is 147/77 and her pulse is 61. Her respiration is 16 and oxygen saturation is 97%.   Lungs are clear to auscultation bilaterally. Heart has regular rate and rhythm. No palpable cervical, supraclavicular, or axillary adenopathy. The right breast shows no mass, nipple discharge/bleeding. The left breast is well healed. Some hyperpigmentation changes and edema. Diffuse thickening in the central breast. No dominant mass.  Lab Findings: Lab Results  Component Value Date   WBC 10.7* 03/16/2015   HGB 13.7 03/16/2015   HCT 41.8 03/16/2015   MCV 99.5 03/16/2015   PLT 322 03/16/2015    Radiographic Findings: No results found.  Impression:  The patient is recovering from the  effects of radiation. The patient's skin has healed well at this time.  Plan: The patient is scheduled to follow up with Dr. Lindi Adie on 11/14/15. The patient will follow up with radiation oncology in 3 months.  ____________________________________

## 2015-08-23 ENCOUNTER — Telehealth: Payer: Self-pay | Admitting: Oncology

## 2015-08-23 ENCOUNTER — Telehealth: Payer: Self-pay | Admitting: *Deleted

## 2015-08-23 NOTE — Telephone Encounter (Signed)
FYI "I need to know if I can bleach my hair to cover the gray.  I take the anti-estrogen, Anastrozole and noticed more hair in comb than usual."  Advised that yes hair loss is rare but could be from the anastrozole.  Advised she ask cosmetologist to do a strand test and check the health of her hair to determine if the formula for coloring the hair will be tolerated and not affect the hair

## 2015-08-23 NOTE — Telephone Encounter (Signed)
[name] called and said that her hair has been thinning more and is wondering if it is OK to have her hair dyed.  Advised her that should be OK and also recommended that she be very gentle with her hair and to use a mild shampoo.  [name] was also wondering if anastrozole could cause her hair to thin.  Advised her that per [employer], it could cause hair thinning.  Advised her not to stop taking anastrozole and to call Dr. Geralyn Flash office with any questions.  [name] verbalized agreement and understanding.

## 2015-11-09 ENCOUNTER — Other Ambulatory Visit: Payer: Self-pay | Admitting: *Deleted

## 2015-11-09 DIAGNOSIS — C50312 Malignant neoplasm of lower-inner quadrant of left female breast: Secondary | ICD-10-CM

## 2015-11-10 ENCOUNTER — Encounter (HOSPITAL_COMMUNITY): Payer: Self-pay

## 2015-11-10 ENCOUNTER — Other Ambulatory Visit (HOSPITAL_BASED_OUTPATIENT_CLINIC_OR_DEPARTMENT_OTHER): Payer: Medicare Other

## 2015-11-10 ENCOUNTER — Ambulatory Visit (HOSPITAL_COMMUNITY)
Admission: RE | Admit: 2015-11-10 | Discharge: 2015-11-10 | Disposition: A | Discharge status: Home / Self Care | Payer: Medicare Other | Source: Ambulatory Visit | Attending: Hematology and Oncology | Admitting: Hematology and Oncology

## 2015-11-10 DIAGNOSIS — R918 Other nonspecific abnormal finding of lung field: Secondary | ICD-10-CM | POA: Diagnosis not present

## 2015-11-10 DIAGNOSIS — I700 Atherosclerosis of aorta: Secondary | ICD-10-CM | POA: Diagnosis not present

## 2015-11-10 DIAGNOSIS — I2510 Atherosclerotic heart disease of native coronary artery without angina pectoris: Secondary | ICD-10-CM | POA: Diagnosis not present

## 2015-11-10 DIAGNOSIS — C50312 Malignant neoplasm of lower-inner quadrant of left female breast: Secondary | ICD-10-CM

## 2015-11-10 LAB — CBC WITH DIFFERENTIAL/PLATELET
BASO%: 0.7 % (ref 0.0–2.0)
Basophils Absolute: 0.1 10*3/uL (ref 0.0–0.1)
EOS%: 0.7 % (ref 0.0–7.0)
Eosinophils Absolute: 0.1 10*3/uL (ref 0.0–0.5)
HCT: 39.3 % (ref 34.8–46.6)
HGB: 12.3 g/dL (ref 11.6–15.9)
LYMPH%: 11.7 % — AB (ref 14.0–49.7)
MCH: 29.1 pg (ref 25.1–34.0)
MCHC: 31.3 g/dL — ABNORMAL LOW (ref 31.5–36.0)
MCV: 93.1 fL (ref 79.5–101.0)
MONO#: 0.8 10*3/uL (ref 0.1–0.9)
MONO%: 7.3 % (ref 0.0–14.0)
NEUT%: 79.6 % — ABNORMAL HIGH (ref 38.4–76.8)
NEUTROS ABS: 8.2 10*3/uL — AB (ref 1.5–6.5)
PLATELETS: 396 10*3/uL (ref 145–400)
RBC: 4.22 10*6/uL (ref 3.70–5.45)
RDW: 14.5 % (ref 11.2–14.5)
WBC: 10.2 10*3/uL (ref 3.9–10.3)
lymph#: 1.2 10*3/uL (ref 0.9–3.3)

## 2015-11-10 LAB — COMPREHENSIVE METABOLIC PANEL
ALT: 13 U/L (ref 0–55)
AST: 14 U/L (ref 5–34)
Albumin: 3.5 g/dL (ref 3.5–5.0)
Alkaline Phosphatase: 73 U/L (ref 40–150)
Anion Gap: 10 mEq/L (ref 3–11)
BUN: 27.3 mg/dL — AB (ref 7.0–26.0)
CO2: 27 meq/L (ref 22–29)
CREATININE: 0.9 mg/dL (ref 0.6–1.1)
Calcium: 9.8 mg/dL (ref 8.4–10.4)
Chloride: 103 mEq/L (ref 98–109)
EGFR: 60 mL/min/{1.73_m2} — ABNORMAL LOW (ref >90)
GLUCOSE: 93 mg/dL (ref 70–140)
Potassium: 4.7 mEq/L (ref 3.5–5.1)
SODIUM: 140 meq/L (ref 136–145)
Total Protein: 7.9 g/dL (ref 6.4–8.3)

## 2015-11-10 MED ORDER — IOPAMIDOL (ISOVUE-300) INJECTION 61%
75.0000 mL | Freq: Once | INTRAVENOUS | Status: AC | PRN
  Administered 2015-11-10: 75 mL via INTRAVENOUS

## 2015-11-11 ENCOUNTER — Ambulatory Visit: Payer: Medicare Other | Admitting: Internal Medicine

## 2015-11-14 ENCOUNTER — Encounter: Payer: Self-pay | Admitting: Hematology and Oncology

## 2015-11-14 ENCOUNTER — Ambulatory Visit (HOSPITAL_BASED_OUTPATIENT_CLINIC_OR_DEPARTMENT_OTHER): Payer: Medicare Other | Admitting: Hematology and Oncology

## 2015-11-14 DIAGNOSIS — C779 Secondary and unspecified malignant neoplasm of lymph node, unspecified: Secondary | ICD-10-CM

## 2015-11-14 DIAGNOSIS — C50312 Malignant neoplasm of lower-inner quadrant of left female breast: Secondary | ICD-10-CM

## 2015-11-14 NOTE — Progress Notes (Signed)
Patient Care Team: [name], MD as PCP - General (Internal Medicine) Alphonsa Overall, MD as Consulting Physician (General Surgery) Nicholas Lose, MD as Consulting Physician (Hematology and Oncology) Gery Pray, MD as Consulting Physician (Radiation Oncology) Sylvan Cheese, NP as Nurse Practitioner (Hematology and Oncology)  DIAGNOSIS: Breast cancer of lower-inner quadrant of left female breast [facility])   Staging form: Breast, AJCC 7th Edition   - Clinical stage from 02/16/2015: Stage IIA (T1c, N1, M0) - Unsigned  SUMMARY OF ONCOLOGIC HISTORY:   Breast cancer of lower-inner quadrant of left female breast ([place])   01/24/2015 Mammogram    Area of palpable concern left breast lower inner quadrant focal skin thickening with architectural distortion, ultrasound showed 8:30: 1.1 x 1.7 x 0.9 cm abnormality, left axilla to pathologic axillary lymph nodes cortical thickening 1.3 cm, 9 mm      02/07/2015 Initial Diagnosis    Left breast biopsy 8:00: IDC with DCIS grade 2-3, ER 95%, PR 60%, Ki-67 15%, HER-2 negative ratio 1.31; left axillary lymph node biopsy positive for metastatic carcinoma with extracapsular extension      03/22/2015 Surgery    Left lumpectomy: Invasive and in situ lobular carcinoma grade 2, 2 cm, LVI present, posterior lateral and anterior margins broadly involved, 12/12 LN positive with ECE, ER 95%, PR 60%, HER-2 neg ratio 1.31, Ki-67 15%, T3 N3 (stage 3C)      03/31/2015 Imaging    CT CAP and bone scan Postsurgical changes suspected residual enhancing soft tissue in the anterior/lateral left breast worrisome for residual tumor , associated left supraclavicular, subpectoral and left axillary LN, small hilar LN, small lung nodules      05/06/2015 - 07/07/2015 Radiation Therapy    Adjuvant radiation therapy with Dr. Sondra Come. Left Breast: 50.4 Gy in 28 fractions, Left Breast Boost: 14 Gy in 7 fractions (64.4) Left Axilla/SCF: 50.4 Gy in 28 fractions      07/28/2015 -   Anti-estrogen oral therapy    Anastrozole 1 mg daily      11/10/2015 Imaging    Increase in the number and size of numerous nodular opacities in the lungs bilaterally largest left lower lobe 13 x 28 mm, right lower lobe 18 x 16 mm, no lymphadenopathy        CHIEF COMPLIANT: Follow-up after recent CT scan showing worsening lung nodules  INTERVAL HISTORY: [name] is a 81 year old lady with very high risk left breast cancer who had 12 lymph nodes positive with N3 disease. She underwent lumpectomy followed by adjuvant radiation and is started on anastrozole therapy. She is tolerating anastrozole extremely well. Because of extensive lymphadenopathy she had CT scan of February 2017. The CT scan showed extensive lymphadenopathy and small lung nodules. Most recent CT scan done on 11/10/2015 showed increasing size and number of the numerous lung nodules left lower lobe 2.8 cm and right lower lobe 1.8 cm. Lymphadenopathy in the chest wall has resolved.  REVIEW OF SYSTEMS:   Constitutional: Denies fevers, chills or abnormal weight loss Eyes: Denies blurriness of vision Ears, nose, mouth, throat, and face: Denies mucositis or sore throat Respiratory: Denies cough, dyspnea or wheezes Cardiovascular: Denies palpitation, chest discomfort Gastrointestinal:  Denies nausea, heartburn or change in bowel habits Skin: Denies abnormal skin rashes Lymphatics: Denies new lymphadenopathy or easy bruising Neurological:Denies numbness, tingling or new weaknesses Behavioral/Psych: Mood is stable, no new changes  Extremities: No lower extremity edema  All other systems were reviewed with the patient and are negative.  I have  reviewed the past medical history, past surgical history, social history and family history with the patient and they are unchanged from previous note.  ALLERGIES:  is allergic to codeine; penicillins; and silvadene [silver sulfadiazine].  MEDICATIONS:  Current Outpatient Prescriptions    Medication Sig Dispense Refill  . acetaminophen (TYLENOL) 325 MG tablet Take 650 mg by mouth every 6 (six) hours as needed. Reported on 04/20/2015    . albuterol (PROAIR HFA) 108 (90 BASE) MCG/ACT inhaler Inhale 2 puffs into the lungs every 4 (four) hours as needed. For shortness of breath    . amiodarone (PACERONE) 200 MG tablet Take 100 mg by mouth daily.     [name] amLODipine (NORVASC) 2.5 MG tablet Take 2.5 mg by mouth daily.    [name] anastrozole (ARIMIDEX) 1 MG tablet Take 1 tablet (1 mg total) by mouth daily. 90 tablet 3  . atorvastatin (LIPITOR) 10 MG tablet Take 10 mg by mouth daily.   2  . calcium carbonate (OS-CAL) 600 MG TABS tablet Take 600 mg by mouth daily with breakfast. Reported on 08/18/2015    . furosemide (LASIX) 40 MG tablet Take 40 mg by mouth daily. Reported on 06/28/2015    . levothyroxine (SYNTHROID, LEVOTHROID) 137 MCG tablet Take 137 mcg by mouth daily before breakfast.    . LORazepam (ATIVAN) 0.5 MG tablet Take 0.5 mg by mouth 2 (two) times daily as needed.      [name] losartan (COZAAR) 100 MG tablet Take 100 mg by mouth daily.    . magnesium oxide (MAG-OX) 400 MG tablet Take 400 mg by mouth 2 (two) times daily.      . mometasone-formoterol (DULERA) 100-5 MCG/ACT AERO Inhale 2 puffs into the lungs 2 (two) times daily. 1 Inhaler 6  . Multiple Vitamin (MULTIVITAMIN) tablet Take 1 tablet by mouth daily.    . Rivaroxaban (XARELTO) 20 MG TABS Take 1 tablet (20 mg total) by mouth daily with supper. 30 tablet 2  . spironolactone (ALDACTONE) 25 MG tablet Take 25 mg by mouth daily.   2  . tiotropium (SPIRIVA) 18 MCG inhalation capsule Place 1 capsule (18 mcg total) into inhaler and inhale daily. 10 capsule 0  . vitamin B-12 (CYANOCOBALAMIN) 1000 MCG tablet Take 1,000 mcg by mouth daily. Reported on 08/18/2015     No current facility-administered medications for this visit.     PHYSICAL EXAMINATION: ECOG PERFORMANCE STATUS: 1 - Symptomatic but completely ambulatory  Vitals:   11/14/15 1539   BP: (!) 151/71  Pulse: 70  Resp: 18  Temp: 98 F (36.7 C)   Filed Weights   11/14/15 1539  Weight: 145 lb (65.8 kg)    GENERAL:alert, no distress and comfortable SKIN: skin color, texture, turgor are normal, no rashes or significant lesions EYES: normal, Conjunctiva are pink and non-injected, sclera clear OROPHARYNX:no exudate, no erythema and lips, buccal mucosa, and tongue normal  NECK: supple, thyroid normal size, non-tender, without nodularity LYMPH:  no palpable lymphadenopathy in the cervical, axillary or inguinal LUNGS: clear to auscultation and percussion with normal breathing effort HEART: regular rate & rhythm and no murmurs and no lower extremity edema ABDOMEN:abdomen soft, non-tender and normal bowel sounds MUSCULOSKELETAL:no cyanosis of digits and no clubbing  NEURO: alert & oriented x 3 with fluent speech, no focal motor/sensory deficits EXTREMITIES: No lower extremity edema  LABORATORY DATA:  I have reviewed the data as listed   Chemistry      Component Value Date/Time   NA 140 11/10/2015 1342  K 4.7 11/10/2015 1342   CL 102 03/16/2015 1330   CO2 27 11/10/2015 1342   BUN 27.3 (H) 11/10/2015 1342   CREATININE 0.9 11/10/2015 1342      Component Value Date/Time   CALCIUM 9.8 11/10/2015 1342   ALKPHOS 73 11/10/2015 1342   AST 14 11/10/2015 1342   ALT 13 11/10/2015 1342   BILITOT <0.30 11/10/2015 1342       Lab Results  Component Value Date   WBC 10.2 11/10/2015   HGB 12.3 11/10/2015   HCT 39.3 11/10/2015   MCV 93.1 11/10/2015   PLT 396 11/10/2015   NEUTROABS 8.2 (H) 11/10/2015     ASSESSMENT & PLAN:  Breast cancer of lower-inner quadrant of left female breast ([place]) Left lumpectomy 03/22/2015: Invasive and in situ lobular carcinoma grade 2, 6 cm, LVI present, posterior lateral and anterior margins broadly involved, 12/12 LN positive with ECE, ER 95%, PR 60%, HER-2 neg ratio 1.31, Ki-67 15%, T3 N3 (stage 3C) Adjuvant radiation therapy 05/06/2015  - 07/07/2015 Left Breast: 50.4 Gy in 28 fractions Left Breast Boost: 14 Gy in 7 fractions (64.4) Left Axilla/SCF: 50.4 Gy in 28 fractions  (CT of the chest abdomen and pelvis and a bone scan 03/31/2015: Postsurgical changes suspected residual enhancing soft tissue in the anterior/lateral left breast worrisome for residual tumor , associated left supraclavicular, subpectoral and left axillary nodal metastases , additional small thoracic lymph nodes are indeterminate , scattered small pulmonary nodules measuring up to 6 mm indeterminate Bone scan: No bone metastases)  CT chest 11/10/2015: Increase in the number and size of numerous nodular opacities in the lungs bilaterally largest left lower lobe 13 x 28 mm, right lower lobe 18 x 16 mm, no lymphadenopathy  Radiology review: I discussed the CT scan of the chest report. I'm worried that these lung nodules are actually metastatic disease.  I will refer the patient to Dr. Annamaria Boots her pulmonologist to evaluate.  Current treatment: Anastrozole 1 mg daily started 07/28/2015  Anastrozole toxicities: 1. Hair thinning  I will send a message to Dr. Annamaria Boots regarding her lung nodules and see if he needs a PET/CT scan prior to his appointment which is happening in the next 10 days. Patient will need a biopsy of one of these lung nodules. I will request him to see if it can be done through bronchoscopy or through percutaneous needle biopsy.  Return to clinic after biopsy results are available.  No orders of the defined types were placed in this encounter.  The patient has a good understanding of the overall plan. she agrees with it. she will call with any problems that may develop before the next visit here.   Rulon Eisenmenger, MD 11/14/15

## 2015-11-14 NOTE — Assessment & Plan Note (Signed)
Left lumpectomy 03/22/2015: Invasive and in situ lobular carcinoma grade 2, 6 cm, LVI present, posterior lateral and anterior margins broadly involved, 12/12 LN positive with ECE, ER 95%, PR 60%, HER-2 neg ratio 1.31, Ki-67 15%, T3 N3 (stage 3C) Adjuvant radiation therapy 05/06/2015 - 07/07/2015 Left Breast: 50.4 Gy in 28 fractions Left Breast Boost: 14 Gy in 7 fractions (64.4) Left Axilla/SCF: 50.4 Gy in 28 fractions  (CT of the chest abdomen and pelvis and a bone scan 03/31/2015: Postsurgical changes suspected residual enhancing soft tissue in the anterior/lateral left breast worrisome for residual tumor , associated left supraclavicular, subpectoral and left axillary nodal metastases , additional small thoracic lymph nodes are indeterminate , scattered small pulmonary nodules measuring up to 6 mm indeterminate Bone scan: No bone metastases)  CT chest 11/10/2015: Increase in the number and size of numerous nodular opacities in the lungs bilaterally largest left lower lobe 13 x 28 mm, right lower lobe 18 x 16 mm, no lymphadenopathy  Radiology review: I discussed the CT scan of the chest report. I'm worried that these lung nodules are actually metastatic disease.   Current treatment: Anastrozole 1 mg daily started 07/28/2015  Anastrozole toxicities: 1. Hair thinning  Plan to perform another CT chest in 3 months and follow-up after that.

## 2015-11-18 ENCOUNTER — Encounter: Payer: Self-pay | Admitting: Oncology

## 2015-11-21 ENCOUNTER — Encounter: Payer: Self-pay | Admitting: Internal Medicine

## 2015-11-21 ENCOUNTER — Ambulatory Visit (INDEPENDENT_AMBULATORY_CARE_PROVIDER_SITE_OTHER): Payer: Medicare Other | Admitting: Internal Medicine

## 2015-11-21 VITALS — BP 114/68 | HR 53 | Ht 63.0 in | Wt 145.4 lb

## 2015-11-21 DIAGNOSIS — R918 Other nonspecific abnormal finding of lung field: Secondary | ICD-10-CM

## 2015-11-21 DIAGNOSIS — C50912 Malignant neoplasm of unspecified site of left female breast: Secondary | ICD-10-CM

## 2015-11-21 NOTE — Patient Instructions (Addendum)
Order- schedule PET CT neck to thigh           dx lung nodules, Breast Cancer   Once we have the result of the PET scan you and I will talk about how best to get a biopsy sample of your lung.

## 2015-11-21 NOTE — Progress Notes (Signed)
Patient ID: [name], female    DOB: [birth year], 81 y.o.   MRN: [id number]  HPI F former smoker followed for dyspnea, COPD,  Complicated by hypothyroid, depression, HBP, breast Ca HPI.   11/06/13- 79 yoF former smoker followed for dyspnea, COPD,  Complicated by hypothyroid, depression, HBP, AFib/ Xarelto/ L body CVA-resolved. FOLLOWS FOR: pt c/o occasional SOB with exertion.  Denies cough, congestion.   CXR 05/14/13 IMPRESSION:  Emphysema. No active lung disease.  Electronically Signed  By: Ivar Drape M.D.  On: 05/14/2013 13:00  11/11/14- 79 yoF former smoker followed for dyspnea, COPD,  Complicated by hypothyroid, depression, HBP, AFib/ Xarelto/ L body CVA-resolved Follows For: pt states shes doing well. pt c/o SOB when walks down her driveway but its not unmanagable. pt has no other concerns,  Rarely catches colds. Paces herself. Little wheeze or cough. Gets flu vaccine from her primary physician. Completed pulmonary rehabilitation.  11/21/2015-81 year old female former smoker followed for dyspnea/COPD, bilateral lung nodules complicated by hypothyroid, depression, HBP, A. Fib/Xarelto/,  left body CVA-resolved, Breast CA metastatic FOLLOWS FOR: Pt states she does pretty good overall but has moments of SOB and giving out quickly. Dr Lindi Adie, breast Oncologist, messaged asking about PET or bronch because of lung nodules on chest CT.-  had 12 positive lymph nodes. She denies symptoms of infection-no fever or night sweats, no routine cough. Somewhat easier dyspnea on exertion hills and stairs. Weight is down a few pounds. No chest pain or sense of chest congestion. Not aware of any enlarged nodes, abnormal bruising or bleeding. CT chest 11/10/2015- IMPRESSION: 1. Today's study demonstrates new and enlarging nodular opacities in the lungs bilaterally. While these could certainly be neoplastic, the appearance is most concerning for potential atypical infection, including fungal pneumonia  (e.g., angioinvasive aspergillosis). Clinical correlation for immunosuppression and signs and symptoms of infection is recommended. In the absence of infectious symptoms, metastatic disease would be most likely. 2. Aortic atherosclerosis, in addition to 2 vessel coronary artery disease. Assessment for potential risk factor modification, dietary therapy or pharmacologic therapy may be warranted, if clinically indicated. 3. There are calcifications of the aortic valve. Echocardiographic correlation for evaluation of potential valvular dysfunction may be warranted if clinically indicated. 4. Additional incidental findings, as above. Electronically Signed   By: Vinnie Langton M.D.   On: 11/10/2015 18:49 Lab 11/10/2015-WBC 10,200,Eos normal, chemistries normal  Review of Systems-see HPI Constitutional:   No weight loss, night sweats,  Fevers, chills, fatigue, lassitude. HEENT:   No headaches,  Difficulty swallowing,  Tooth/dental problems,  Sore throat,                No sneezing, itching, ear ache, nasal congestion, post nasal drip,  CV:  No chest pain,  Orthopnea, PND, swelling in lower extremities, anasarca, dizziness, palpitations GI  No heartburn, indigestion, abdominal pain, nausea, vomiting,  Resp: As per HPI.  No excess mucus, no productive cough,  No non-productive cough,  No coughing up of blood.  No change in color of mucus.  No wheezing.    Skin: no rash or lesions. GU:   MS:  No joint pain or swelling.  Psych:  No change in mood or affect. No depression or anxiety.  No memory loss.    Objective:   Physical Exam General- Alert, Oriented, Affect-appropriate, Distress- none acute Skin- rash-none, lesions- none, excoriation- none Lymphadenopathy- none Head- atraumatic            Eyes- Gross vision intact, PERRLA, conjunctivae clear secretions  Ears- Hearing, canals            Nose- Clear, Septal dev, mucus, polyps, erosion, perforation             Throat-  Mallampati II , mucosa clear , drainage- none, tonsils- atrophic Neck- flexible , trachea midline, no stridor , thyroid nl, carotid no bruit Chest - symmetrical excursion , unlabored           Heart/CV- RRR occasional extra beat , + trace AS murmur , no gallop  , no rub, nl s1 s2                           - JVD- none , edema- none, stasis changes- none, varices- none           Lung- +clear/ distant, unlabored , wheeze- none, cough- none , dullness-none, rub- none           Chest wall-  Abd- Br/ Gen/ Rectal- Not done, not indicated Extrem- cyanosis- none, clubbing, none, atrophy- none, strength- nl Neuro- grossly intact to observation

## 2015-11-23 DIAGNOSIS — R918 Other nonspecific abnormal finding of lung field: Secondary | ICD-10-CM | POA: Insufficient documentation

## 2015-11-23 NOTE — Assessment & Plan Note (Signed)
Radiologist suggested these nodules-new and enlarging bilaterally-might be atypical infection. She offers no symptoms suggestive of infection or exacerbation of asthmatic bronchitis, so I 'm afraid these will probably be metastases. She'll need a biopsy procedure of some sort. PET scan activity will be nonspecific, but helpful in identifying a best target (which may not even be pulmonary). I explained this to her and her son. Plan-schedule PET scan. I will call her to discuss next steps after I see that result, but currently leaning towards needle biopsy of dominant lung nodule, off Xarelto.

## 2015-11-24 ENCOUNTER — Encounter: Payer: Self-pay | Admitting: Radiation Oncology

## 2015-11-24 ENCOUNTER — Ambulatory Visit
Admission: RE | Admit: 2015-11-24 | Discharge: 2015-11-24 | Disposition: A | Discharge status: Home / Self Care | Payer: Medicare Other | Source: Ambulatory Visit | Attending: Radiation Oncology | Admitting: Radiation Oncology

## 2015-11-24 DIAGNOSIS — R918 Other nonspecific abnormal finding of lung field: Secondary | ICD-10-CM | POA: Diagnosis not present

## 2015-11-24 DIAGNOSIS — Z79811 Long term (current) use of aromatase inhibitors: Secondary | ICD-10-CM | POA: Insufficient documentation

## 2015-11-24 DIAGNOSIS — Y842 Radiological procedure and radiotherapy as the cause of abnormal reaction of the patient, or of later complication, without mention of misadventure at the time of the procedure: Secondary | ICD-10-CM | POA: Diagnosis not present

## 2015-11-24 DIAGNOSIS — Z880 Allergy status to penicillin: Secondary | ICD-10-CM | POA: Insufficient documentation

## 2015-11-24 DIAGNOSIS — Z170 Estrogen receptor positive status [ER+]: Secondary | ICD-10-CM | POA: Insufficient documentation

## 2015-11-24 DIAGNOSIS — Z7901 Long term (current) use of anticoagulants: Secondary | ICD-10-CM | POA: Diagnosis not present

## 2015-11-24 DIAGNOSIS — Z79899 Other long term (current) drug therapy: Secondary | ICD-10-CM | POA: Insufficient documentation

## 2015-11-24 DIAGNOSIS — Z888 Allergy status to other drugs, medicaments and biological substances status: Secondary | ICD-10-CM | POA: Diagnosis not present

## 2015-11-24 DIAGNOSIS — C50312 Malignant neoplasm of lower-inner quadrant of left female breast: Secondary | ICD-10-CM | POA: Diagnosis not present

## 2015-11-24 DIAGNOSIS — Z923 Personal history of irradiation: Secondary | ICD-10-CM | POA: Insufficient documentation

## 2015-11-24 NOTE — Progress Notes (Signed)
[name] is here for follow up after treatment to her left breast.  She denies having pain.  She had a CT scan on 11/10/15 that showed "enlarging nodular opacities in the lungs bilaterally."  She will have a PET scan on 11/29/15.  She reports feeling tired.  She is taking Arimidex. The skin on her left breast is pink.  BP (!) 176/76 (BP Location: Right Arm, Patient Position: Sitting)   Pulse (!) 121   Temp 97.8 F (36.6 C) (Oral)   Ht '5\' 3"'$  (1.6 m)   Wt 146 lb 3.2 oz (66.3 kg)   SpO2 100%   BMI 25.90 kg/m    Wt Readings from Last 3 Encounters:  11/24/15 146 lb 3.2 oz (66.3 kg)  11/21/15 145 lb 6.4 oz (66 kg)  11/14/15 145 lb (65.8 kg)

## 2015-11-24 NOTE — Progress Notes (Signed)
Radiation Oncology         [phone] ________________________________  Name: [name] MRN: [id number]  Date: 11/24/2015  DOB: 1934-04-20  Follow-Up Visit Note  CC: [name], MD  Alphonsa Overall, MD    ICD-9-CM ICD-10-CM   1. Breast cancer of lower-inner quadrant of left female breast ([place]) 174.3 C50.312     Diagnosis: Stage IIIC (pT3, N3a) invasive and in situ lobular carcinoma of the left breast with positive margins (ER/PR +, HER2 -)  Interval Since Last Radiation: 4 months  05/05/2015-07/07/2015: Left Breast: 50.4 Gy in 28 fractions           Left Breast Boost: 14 Gy in 7 fractions (64.4)           Left Axilla/SCF: 50.4 Gy in 28 fractions  Narrative:  The patient returns today for routine follow-up. She denies having pain, but reports fatigue. feeling tired.  She is taking Arimidex and the nurse noted the skin on her left breast is pink. The patient had a CT of the chest w/ contrast on 11/10/15 showed new and enlarged nodular opacities in the bilateral lungs.  ALLERGIES:  is allergic to codeine; penicillins; and silvadene [silver sulfadiazine].  Meds: Current Outpatient Prescriptions  Medication Sig Dispense Refill  . acetaminophen (TYLENOL) 325 MG tablet Take 650 mg by mouth every 6 (six) hours as needed. Reported on 04/20/2015    . albuterol (PROAIR HFA) 108 (90 BASE) MCG/ACT inhaler Inhale 2 puffs into the lungs every 4 (four) hours as needed. For shortness of breath    . ALPRAZolam (XANAX) 0.5 MG tablet Take 0.5 mg by mouth at bedtime as needed for anxiety.    [name] amiodarone (PACERONE) 200 MG tablet Take 100 mg by mouth daily.     [name] amLODipine (NORVASC) 2.5 MG tablet Take 2.5 mg by mouth daily.    [name] anastrozole (ARIMIDEX) 1 MG tablet Take 1 tablet (1 mg total) by mouth daily. 90 tablet 3  . atorvastatin (LIPITOR) 10 MG tablet Take 10 mg by mouth daily.   2  . calcium carbonate (OS-CAL) 600 MG TABS tablet Take 600 mg by mouth daily with breakfast. Reported on 08/18/2015      . furosemide (LASIX) 40 MG tablet Take 40 mg by mouth daily. Reported on 06/28/2015    . levothyroxine (SYNTHROID, LEVOTHROID) 137 MCG tablet Take 137 mcg by mouth daily before breakfast.    . losartan (COZAAR) 100 MG tablet Take 100 mg by mouth daily.    . magnesium oxide (MAG-OX) 400 MG tablet Take 400 mg by mouth 2 (two) times daily.      . mometasone-formoterol (DULERA) 100-5 MCG/ACT AERO Inhale 2 puffs into the lungs 2 (two) times daily. 1 Inhaler 6  . Multiple Vitamin (MULTIVITAMIN) tablet Take 1 tablet by mouth daily.    . Rivaroxaban (XARELTO) 20 MG TABS Take 1 tablet (20 mg total) by mouth daily with supper. 30 tablet 2  . spironolactone (ALDACTONE) 25 MG tablet Take 25 mg by mouth daily.   2  . tiotropium (SPIRIVA) 18 MCG inhalation capsule Place 1 capsule (18 mcg total) into inhaler and inhale daily. 10 capsule 0  . vitamin B-12 (CYANOCOBALAMIN) 1000 MCG tablet Take 1,000 mcg by mouth daily. Reported on 08/18/2015    . LORazepam (ATIVAN) 0.5 MG tablet Take 0.5 mg by mouth 2 (two) times daily as needed.       No current facility-administered medications for this encounter.     Physical Findings: The patient  is in no acute distress. Patient is alert and oriented.  height is 5' 3"  (1.6 m) and weight is 146 lb 3.2 oz (66.3 kg). Her oral temperature is 97.8 F (36.6 C). Her blood pressure is 176/76 (abnormal) and her pulse is 121 (abnormal). Her oxygen saturation is 100%.   Lungs are clear to auscultation bilaterally. Heart has regular rate and rhythm. No palpable cervical, supraclavicular, or axillary adenopathy. The right breast shows no mass, nipple discharge/bleeding. The left breast continues to have edema from her radiation treatments, no nipple discharge/bleeding, no dominant mass appreciated. Mild reddish hue to her left breast related to her continued edema.  Lab Findings: Lab Results  Component Value Date   WBC 10.2 11/10/2015   HGB 12.3 11/10/2015   HCT 39.3 11/10/2015    MCV 93.1 11/10/2015   PLT 396 11/10/2015    Radiographic Findings: Ct Chest W Contrast  Result Date: 11/10/2015 CLINICAL DATA:  81 year old female with history of left-sided breast cancer diagnosed in December 2016 treated with left lumpectomy, status post radiation therapy. EXAM: CT CHEST WITH CONTRAST TECHNIQUE: Multidetector CT imaging of the chest was performed during intravenous contrast administration. CONTRAST:  75m ISOVUE-300 IOPAMIDOL (ISOVUE-300) INJECTION 61% COMPARISON:  Chest CT 03/31/2015. FINDINGS: Cardiovascular: Heart size is normal. There is no significant pericardial fluid, thickening or pericardial calcification. There is aortic atherosclerosis, as well as atherosclerosis of the great vessels of the mediastinum and the coronary arteries, including calcified atherosclerotic plaque in the left anterior descending and left circumflex coronary arteries. Thickening calcification of the aortic valve. Mediastinum/Nodes: No pathologically enlarged mediastinal or hilar lymph nodes. Esophagus is unremarkable in appearance. Surgical clips in the left axilla, compatible with prior lymph node dissection. In the superior aspect of the left axilla there is some soft tissue prominence measuring 2.9 x 2.1 cm (image 10 of series 3) which likely reflects a resolving postoperative fluid collection, as this appears smaller than the prior examination from 03/31/2015. No definite axillary lymphadenopathy is noted on today's examination. Lungs/Pleura: Compared to the prior examination there has been an increased number and size of numerous nodular opacities in the lungs bilaterally. While many these could certainly be neoplastic, several of them have somewhat ill-defined margins with subtle surrounding ground-glass attenuation, which could indicate an infectious etiology. The largest of these nodules is in the anterior aspect of the left lower lobe (image 74 of series 4) measuring 13 x 28 mm, and in the right  lower lobe (image 101 of series 4) measuring 18 x 16 mm. There is also some architectural distortion and subpleural reticulation in the anterior aspect of the left upper lobe deep to the left breast, presumably sequela of prior radiation therapy. Similar findings are also noted in the apex of the left upper lobe adjacent to the left axilla. No pleural effusions. Upper Abdomen: Sub cm low-attenuation lesion in the upper pole the left kidney is too small to definitively characterize, but is similar to the prior study, likely tiny cysts. Aortic atherosclerosis. Musculoskeletal: Postoperative fluid and gas collection in the left breast has partially resolved, although there continues to be a small amount of fluid, likely a postoperative seroma. Skin thickening throughout the left breast, presumably sequela of prior radiation therapy. There are no aggressive appearing lytic or blastic lesions noted in the visualized portions of the skeleton. IMPRESSION: 1. Today's study demonstrates new and enlarging nodular opacities in the lungs bilaterally. While these could certainly be neoplastic, the appearance is most concerning for potential atypical infection, including  fungal pneumonia (e.g., angioinvasive aspergillosis). Clinical correlation for immunosuppression and signs and symptoms of infection is recommended. In the absence of infectious symptoms, metastatic disease would be most likely. 2. Aortic atherosclerosis, in addition to 2 vessel coronary artery disease. Assessment for potential risk factor modification, dietary therapy or pharmacologic therapy may be warranted, if clinically indicated. 3. There are calcifications of the aortic valve. Echocardiographic correlation for evaluation of potential valvular dysfunction may be warranted if clinically indicated. 4. Additional incidental findings, as above. Electronically Signed   By: Vinnie Langton M.D.   On: 11/10/2015 18:49    Impression:  The patient continues to  have some edema from her radiation treatment. Not surprised given her high dose delivered with boost field and significant radiation reaction during treatment. No signs of recurrence on clinical exam today within the breast area.  Plan: A PET scan is scheduled on 11/29/15 to observe the nodular opacities in her lungs. She is seeing her pulmonologist concerning these lung lesions , Dr. Annamaria Boots .The patient will continue close follow up with Dr. Lindi Adie since she is on hormonal therapy. The patient will see me on a PRN basis. ____________________________________ -----------------------------------  Blair Promise, PhD, MD  This document serves as a record of services personally performed by Gery Pray, MD. It was created on his behalf by Darcus Austin, a trained medical scribe. The creation of this record is based on the scribe's personal observations and the provider's statements to them. This document has been checked and approved by the attending provider.

## 2015-11-29 ENCOUNTER — Ambulatory Visit (HOSPITAL_COMMUNITY)
Admission: RE | Admit: 2015-11-29 | Discharge: 2015-11-29 | Disposition: A | Discharge status: Home / Self Care | Payer: Medicare Other | Source: Ambulatory Visit | Attending: Internal Medicine | Admitting: Internal Medicine

## 2015-11-29 DIAGNOSIS — R918 Other nonspecific abnormal finding of lung field: Secondary | ICD-10-CM

## 2015-11-29 DIAGNOSIS — C50912 Malignant neoplasm of unspecified site of left female breast: Secondary | ICD-10-CM | POA: Diagnosis present

## 2015-11-29 LAB — GLUCOSE, CAPILLARY: GLUCOSE-CAPILLARY: 99 mg/dL (ref 65–99)

## 2015-11-30 ENCOUNTER — Telehealth: Payer: Self-pay | Admitting: Internal Medicine

## 2015-11-30 DIAGNOSIS — R918 Other nonspecific abnormal finding of lung field: Secondary | ICD-10-CM

## 2015-11-30 NOTE — Telephone Encounter (Signed)
Spoke with pt. She is aware of what needs to be done. Orders for the CT and ENB have been placed. Will await the Super D disc.

## 2015-11-30 NOTE — Telephone Encounter (Signed)
I called [name] and discussed PET scan showing bilateral hot nodules confined to lung. Infection is not ruled out, but clinical situation favors metastatic breast Ca. I asked Dr Lamonte Sakai to review images and we think guided bronchoscopy is most appropriate tool for biopsy. I reviewed this in outline with patient and she accepts recommendation. She will await contact from Dr Lamonte Sakai nurse about next steps.

## 2015-11-30 NOTE — Telephone Encounter (Signed)
Reviewed CT chest and PET scan, agree that navigational bronchoscopy would be useful here. Please set up, possibly for 12/07/15 am.   I have spoken with [facility] and they are unable to convert CT chest to superD. She will need a new CT, no contrast, SuperD cuts, disc to [employer] / Ria Comment. Thanks.

## 2015-12-01 NOTE — Telephone Encounter (Signed)
ENB scheduled for 12/07/15'@cone''@8'$ :30am pt is aware of this Joellen Jersey

## 2015-12-02 ENCOUNTER — Ambulatory Visit (INDEPENDENT_AMBULATORY_CARE_PROVIDER_SITE_OTHER)
Admission: RE | Admit: 2015-12-02 | Discharge: 2015-12-02 | Disposition: A | Discharge status: Home / Self Care | Payer: Medicare Other | Source: Ambulatory Visit | Attending: Emergency Medicine | Admitting: Emergency Medicine

## 2015-12-02 ENCOUNTER — Other Ambulatory Visit: Payer: Self-pay | Admitting: Emergency Medicine

## 2015-12-02 DIAGNOSIS — R918 Other nonspecific abnormal finding of lung field: Secondary | ICD-10-CM

## 2015-12-02 NOTE — Telephone Encounter (Signed)
Called and lmomtcb for gail at short stay.

## 2015-12-02 NOTE — Telephone Encounter (Signed)
returing call form [name] and  Can be reached @ [phone].[name]

## 2015-12-02 NOTE — Telephone Encounter (Signed)
Baker Janus from short stay called and needs orders for patient - she can be reached at [phone]

## 2015-12-02 NOTE — Telephone Encounter (Signed)
Baker Janus aware that we will send this message to Dr Lamonte Sakai for the orders.  .Please advise Dr Lamonte Sakai. Thanks.

## 2015-12-02 NOTE — Telephone Encounter (Signed)
LM for Baker Janus at [employer]

## 2015-12-02 NOTE — Telephone Encounter (Signed)
Done

## 2015-12-05 ENCOUNTER — Other Ambulatory Visit (HOSPITAL_COMMUNITY): Payer: Medicare Other

## 2015-12-05 NOTE — Pre-Procedure Instructions (Signed)
[name]  12/05/2015      [name] - [name], [name] [address] Phone: [phone] Fax: [phone]  [id number] - [place], [place] - [address] AT [address] [place] [address] Phone: [phone] Fax: [phone]  [name] filled by [name], [address] [place] [address] Phone: [phone] Fax: [phone]    Your procedure is scheduled on Wed, Oct 11 @ 8:30 AM  Report to [name] at 6:30 AM  Call this number if you have problems the morning of surgery:  [phone]   Remember:  Do not eat food or drink liquids after midnight.  Take these medicines the morning of surgery with A SIP OF WATER Albuterol<Bring Your Inhaler With You>,Amiodarone(Pacerone),Amlodipine(Norvasc),Arimidex(Anastrozole),Synthroid(Levothyroxine),Dulera and Spiriva<Bring Inhalers With You>               Hold Xarelto. No Goody's,BC's,Aleve,Advil,Motrin,Ibuprofen,Aspirin,Fish Oil,or any Herbal Medications.    Do not wear jewelry, make-up or nail polish.  Do not wear lotions, powders, perfumes, or deoderant.  Do not shave 48 hours prior to surgery.    Do not bring valuables to the hospital.  [name] is not responsible for any belongings or valuables.  Contacts, dentures or bridgework may not be worn into surgery.  Leave your suitcase in the car.  After surgery it may be brought to your room.  For patients admitted to the hospital, discharge time will be determined by your treatment team.  Patients discharged the day of surgery will not be allowed to drive home.    [facility]  Before surgery, you can play an important role.  Because skin is not sterile, your skin needs to be as free of germs as possible.  You  can reduce the number of germs on you skin by washing with CHG (chlorahexidine gluconate) soap before surgery.  CHG is an antiseptic cleaner which kills germs and bonds with the skin to continue killing germs even after washing.  Please DO NOT use if you have an allergy to CHG or antibacterial soaps.  If your skin becomes reddened/irritated stop using the CHG and inform your nurse when you arrive at Short Stay.  Do not shave (including legs and underarms) for at least 48 hours prior to the first CHG shower.  You may shave your face.  Please follow these instructions carefully:   1.  Shower with CHG Soap the night before surgery and the                                morning of Surgery.  2.  If you choose to wash your hair, wash your hair first as usual with your       normal shampoo.  3.  After you shampoo, rinse your hair and body thoroughly to remove the                      Shampoo.  4.  Use CHG as you would any other liquid soap.  You can apply chg directly       to the skin and wash gently with scrungie or a clean washcloth.  5.  Apply the CHG Soap to your body ONLY FROM THE  NECK DOWN.        Do not use on open wounds or open sores.  Avoid contact with your eyes,       ears, mouth and genitals (private parts).  Wash genitals (private parts)       with your normal soap.  6.  Wash thoroughly, paying special attention to the area where your surgery        will be performed.  7.  Thoroughly rinse your body with warm water from the neck down.  8.  DO NOT shower/wash with your normal soap after using and rinsing off       the CHG Soap.  9.  Pat yourself dry with a clean towel.            10.  Wear clean pajamas.            11.  Place clean sheets on your bed the night of your first shower and do not        sleep with pets.  Day of Surgery  Do not apply any lotions/deoderants the morning of surgery.  Please wear clean clothes to the hospital/surgery center.   Please read over the following  fact sheets that you were given. Pain Booklet and Coughing and Deep Breathing

## 2015-12-06 ENCOUNTER — Telehealth: Payer: Self-pay | Admitting: Emergency Medicine

## 2015-12-06 ENCOUNTER — Ambulatory Visit (HOSPITAL_COMMUNITY)
Admission: RE | Admit: 2015-12-06 | Discharge: 2015-12-06 | Disposition: A | Discharge status: Home / Self Care | Payer: Medicare Other | Source: Ambulatory Visit | Attending: Emergency Medicine | Admitting: Emergency Medicine

## 2015-12-06 ENCOUNTER — Encounter (HOSPITAL_COMMUNITY): Payer: Self-pay

## 2015-12-06 DIAGNOSIS — Z87891 Personal history of nicotine dependence: Secondary | ICD-10-CM | POA: Diagnosis not present

## 2015-12-06 DIAGNOSIS — M1990 Unspecified osteoarthritis, unspecified site: Secondary | ICD-10-CM | POA: Diagnosis not present

## 2015-12-06 DIAGNOSIS — E039 Hypothyroidism, unspecified: Secondary | ICD-10-CM | POA: Diagnosis not present

## 2015-12-06 DIAGNOSIS — Z7901 Long term (current) use of anticoagulants: Secondary | ICD-10-CM | POA: Diagnosis not present

## 2015-12-06 DIAGNOSIS — M797 Fibromyalgia: Secondary | ICD-10-CM | POA: Diagnosis not present

## 2015-12-06 DIAGNOSIS — R918 Other nonspecific abnormal finding of lung field: Secondary | ICD-10-CM | POA: Diagnosis not present

## 2015-12-06 DIAGNOSIS — R011 Cardiac murmur, unspecified: Secondary | ICD-10-CM | POA: Diagnosis not present

## 2015-12-06 DIAGNOSIS — Z853 Personal history of malignant neoplasm of breast: Secondary | ICD-10-CM | POA: Diagnosis not present

## 2015-12-06 DIAGNOSIS — F419 Anxiety disorder, unspecified: Secondary | ICD-10-CM | POA: Diagnosis not present

## 2015-12-06 DIAGNOSIS — F329 Major depressive disorder, single episode, unspecified: Secondary | ICD-10-CM | POA: Diagnosis not present

## 2015-12-06 DIAGNOSIS — J449 Chronic obstructive pulmonary disease, unspecified: Secondary | ICD-10-CM | POA: Diagnosis not present

## 2015-12-06 DIAGNOSIS — Z8673 Personal history of transient ischemic attack (TIA), and cerebral infarction without residual deficits: Secondary | ICD-10-CM | POA: Diagnosis not present

## 2015-12-06 DIAGNOSIS — I10 Essential (primary) hypertension: Secondary | ICD-10-CM | POA: Diagnosis not present

## 2015-12-06 DIAGNOSIS — I4891 Unspecified atrial fibrillation: Secondary | ICD-10-CM | POA: Diagnosis not present

## 2015-12-06 DIAGNOSIS — Z9889 Other specified postprocedural states: Secondary | ICD-10-CM

## 2015-12-06 HISTORY — DX: Nonrheumatic aortic (valve) stenosis: I35.0

## 2015-12-06 LAB — CBC
HEMATOCRIT: 39.3 % (ref 36.0–46.0)
HEMOGLOBIN: 12.1 g/dL (ref 12.0–15.0)
MCH: 29.1 pg (ref 26.0–34.0)
MCHC: 30.8 g/dL (ref 30.0–36.0)
MCV: 94.5 fL (ref 78.0–100.0)
Platelets: 399 10*3/uL (ref 150–400)
RBC: 4.16 MIL/uL (ref 3.87–5.11)
RDW: 14.6 % (ref 11.5–15.5)
WBC: 10.3 10*3/uL (ref 4.0–10.5)

## 2015-12-06 LAB — PROTIME-INR
INR: 3.02
Prothrombin Time: 31.9 seconds — ABNORMAL HIGH (ref 11.4–15.2)

## 2015-12-06 LAB — COMPREHENSIVE METABOLIC PANEL
ALBUMIN: 3.7 g/dL (ref 3.5–5.0)
ALK PHOS: 52 U/L (ref 38–126)
ALT: 16 U/L (ref 14–54)
AST: 21 U/L (ref 15–41)
Anion gap: 10 (ref 5–15)
BILIRUBIN TOTAL: 0.5 mg/dL (ref 0.3–1.2)
BUN: 23 mg/dL — AB (ref 6–20)
CALCIUM: 9.3 mg/dL (ref 8.9–10.3)
CO2: 25 mmol/L (ref 22–32)
CREATININE: 0.98 mg/dL (ref 0.44–1.00)
Chloride: 102 mmol/L (ref 101–111)
GFR calc Af Amer: >60 mL/min (ref >60)
GFR calc non Af Amer: 53 mL/min — ABNORMAL LOW (ref >60)
GLUCOSE: 111 mg/dL — AB (ref 65–99)
Potassium: 3.9 mmol/L (ref 3.5–5.1)
SODIUM: 137 mmol/L (ref 135–145)
Total Protein: 7.6 g/dL (ref 6.5–8.1)

## 2015-12-06 LAB — APTT: APTT: 46 s — AB (ref 24–36)

## 2015-12-06 NOTE — Progress Notes (Signed)
Dr.Byrum's office notified that pt. INR was 3.02 today. Procedure is scheduled for tomorrow.

## 2015-12-06 NOTE — Telephone Encounter (Signed)
Please call patient to confirm that she will not take tonight's dose of Xarelto. Thanks very much

## 2015-12-06 NOTE — Telephone Encounter (Signed)
Spoke with pt. She is aware of RB's recommendation. Nothing further was needed.

## 2015-12-06 NOTE — Progress Notes (Signed)
Anesthesia Chart Review: Pt is 81 year old female scheduled for video bronchoscopy with endobronchial navigation on 12/07/2015 by Dr. Lamonte Sakai.   Cardiologist is Dr. Terrence Dupont. Pulmonologist is Dr. Baird Lyons. PCP is Dr. [name].   PMH includes:  Atrial fibrillation, HTN, stroke, COPD, hypothyroidism, breast cancer s/p left breast lumpectomy and axillary LN dissection 03/22/15, IBS, fibromyalgia, depression, anxiety, peripheral edema, bruises easily, exertional dyspnea. Former smoker. BMI 26.   Medications include: albuterol, amiodarone, amlodipine, Arimidex, Lipitor, Lasix, levothyroxine, losartan, Mag-ox, Dulera, Xarelto (last dose 12/05/15 PM), spironolactone, Spiriva.   EKG 03/16/15: Sinus bradycardia (59 bpm) with sinus arrhythmia  Echo 03/17/15 (Dr. Terrence Dupont; scanned under Media tab, Correspondence, encounter 03/22/15): Normal LV size, normal LV systolic function. Mild diastolic relaxation impairment. Mild concentric LVH. Overall wall motion is normal. LV function is normal. Estimated EF 55-60%. Normal RV size and function. No RVH. Normal LA/RA size. Mild calcification of AV leaflets. Mild AS (AVA 1.44 cm2, pk vel 228 cm/s, mean press grad 10 mmHg, peak press grad 20 mmHg). Trace AR. Mild MR. Trace TR. Atrial septum normal. Aorta is normal.   Nuclear stress test 05/08/13: IMPRESSION: Negative for ischemia.  71% ejection fraction.  Chest CT Super D 12/02/15: IMPRESSION: 1. Numerous bilateral pulmonary lesions are stable. 2. Stable postoperative and post radiation changes involving the left breast. 3. No mediastinal or hilar mass or adenopathy. 4. No findings for upper abdominal metastatic disease.  Preoperative labs noted. PT 31.9, INR 3.02. PTT 46. PAT RN to call results to Dr. Lamonte Sakai for further recommendations. Her last Xarelto dose was < 24 hours ago. If case remains as scheduled, then will plan STAT repeat PT/PTT on arrival to re-evaluate coags.  George Hugh [facility] Short Stay  Center/Anesthesiology Phone [phone] 12/06/2015 2:10 PM

## 2015-12-06 NOTE — Telephone Encounter (Signed)
I called and spoke with RB. He was already aware of this results. Per RB, nothing needs to be done differently. Pt is already aware to stop her Xarelto tonight. Spoke with Caren Griffins in [place] Stay. She is aware of RB's response. Nothing further was needed.

## 2015-12-06 NOTE — Progress Notes (Signed)
Spoke with Ria Comment in Dr. Agustina Caroli office ,he is aware of elevated INR ,does not want to make any changes.

## 2015-12-07 ENCOUNTER — Encounter (HOSPITAL_COMMUNITY)
Admission: RE | Disposition: A | Discharge status: Home / Self Care | Payer: Self-pay | Source: Ambulatory Visit | Attending: Emergency Medicine

## 2015-12-07 ENCOUNTER — Ambulatory Visit (HOSPITAL_COMMUNITY): Payer: Medicare Other | Admitting: Certified Registered"

## 2015-12-07 ENCOUNTER — Encounter (HOSPITAL_COMMUNITY): Payer: Self-pay | Admitting: *Deleted

## 2015-12-07 ENCOUNTER — Ambulatory Visit (HOSPITAL_COMMUNITY): Payer: Medicare Other

## 2015-12-07 ENCOUNTER — Ambulatory Visit (HOSPITAL_COMMUNITY)
Admission: RE | Admit: 2015-12-07 | Discharge: 2015-12-07 | Disposition: A | Discharge status: Home / Self Care | Payer: Medicare Other | Source: Ambulatory Visit | Attending: Emergency Medicine | Admitting: Emergency Medicine

## 2015-12-07 ENCOUNTER — Ambulatory Visit (HOSPITAL_COMMUNITY): Payer: Medicare Other | Admitting: Vascular Surgery

## 2015-12-07 DIAGNOSIS — E039 Hypothyroidism, unspecified: Secondary | ICD-10-CM | POA: Insufficient documentation

## 2015-12-07 DIAGNOSIS — J449 Chronic obstructive pulmonary disease, unspecified: Secondary | ICD-10-CM | POA: Insufficient documentation

## 2015-12-07 DIAGNOSIS — M1990 Unspecified osteoarthritis, unspecified site: Secondary | ICD-10-CM | POA: Insufficient documentation

## 2015-12-07 DIAGNOSIS — Z8673 Personal history of transient ischemic attack (TIA), and cerebral infarction without residual deficits: Secondary | ICD-10-CM | POA: Insufficient documentation

## 2015-12-07 DIAGNOSIS — F329 Major depressive disorder, single episode, unspecified: Secondary | ICD-10-CM | POA: Insufficient documentation

## 2015-12-07 DIAGNOSIS — Z853 Personal history of malignant neoplasm of breast: Secondary | ICD-10-CM | POA: Insufficient documentation

## 2015-12-07 DIAGNOSIS — F419 Anxiety disorder, unspecified: Secondary | ICD-10-CM | POA: Insufficient documentation

## 2015-12-07 DIAGNOSIS — Z9889 Other specified postprocedural states: Secondary | ICD-10-CM

## 2015-12-07 DIAGNOSIS — Z87891 Personal history of nicotine dependence: Secondary | ICD-10-CM | POA: Insufficient documentation

## 2015-12-07 DIAGNOSIS — R918 Other nonspecific abnormal finding of lung field: Secondary | ICD-10-CM

## 2015-12-07 DIAGNOSIS — Z7901 Long term (current) use of anticoagulants: Secondary | ICD-10-CM | POA: Insufficient documentation

## 2015-12-07 DIAGNOSIS — I4891 Unspecified atrial fibrillation: Secondary | ICD-10-CM | POA: Insufficient documentation

## 2015-12-07 DIAGNOSIS — I10 Essential (primary) hypertension: Secondary | ICD-10-CM | POA: Insufficient documentation

## 2015-12-07 DIAGNOSIS — Z419 Encounter for procedure for purposes other than remedying health state, unspecified: Secondary | ICD-10-CM

## 2015-12-07 DIAGNOSIS — R011 Cardiac murmur, unspecified: Secondary | ICD-10-CM | POA: Insufficient documentation

## 2015-12-07 DIAGNOSIS — M797 Fibromyalgia: Secondary | ICD-10-CM | POA: Insufficient documentation

## 2015-12-07 HISTORY — PX: VIDEO BRONCHOSCOPY WITH ENDOBRONCHIAL NAVIGATION: SHX6175

## 2015-12-07 LAB — PROTIME-INR
INR: 1.23
Prothrombin Time: 15.6 seconds — ABNORMAL HIGH (ref 11.4–15.2)

## 2015-12-07 LAB — APTT: APTT: 32 s (ref 24–36)

## 2015-12-07 SURGERY — VIDEO BRONCHOSCOPY WITH ENDOBRONCHIAL NAVIGATION
Anesthesia: General

## 2015-12-07 MED ORDER — 0.9 % SODIUM CHLORIDE (POUR BTL) OPTIME
TOPICAL | Status: DC | PRN
  Administered 2015-12-07: 1000 mL

## 2015-12-07 MED ORDER — DEXAMETHASONE SODIUM PHOSPHATE 10 MG/ML IJ SOLN
INTRAMUSCULAR | Status: AC
  Filled 2015-12-07: qty 1

## 2015-12-07 MED ORDER — PROPOFOL 10 MG/ML IV BOLUS
INTRAVENOUS | Status: AC
  Filled 2015-12-07: qty 20

## 2015-12-07 MED ORDER — SUGAMMADEX SODIUM 200 MG/2ML IV SOLN
INTRAVENOUS | Status: DC | PRN
  Administered 2015-12-07: 200 mg via INTRAVENOUS

## 2015-12-07 MED ORDER — ROCURONIUM BROMIDE 10 MG/ML (PF) SYRINGE
PREFILLED_SYRINGE | INTRAVENOUS | Status: AC
  Filled 2015-12-07: qty 10

## 2015-12-07 MED ORDER — MEPERIDINE HCL 25 MG/ML IJ SOLN: 6.2500 mg | INTRAMUSCULAR | Status: DC | PRN

## 2015-12-07 MED ORDER — FENTANYL CITRATE (PF) 100 MCG/2ML IJ SOLN
INTRAMUSCULAR | Status: AC
  Filled 2015-12-07: qty 2

## 2015-12-07 MED ORDER — EPHEDRINE SULFATE 50 MG/ML IJ SOLN
INTRAMUSCULAR | Status: DC | PRN
  Administered 2015-12-07: 15 mg via INTRAVENOUS

## 2015-12-07 MED ORDER — RIVAROXABAN 20 MG PO TABS: 20.0000 mg | ORAL_TABLET | Freq: Every day | ORAL | 2 refills | Status: DC

## 2015-12-07 MED ORDER — FENTANYL CITRATE (PF) 100 MCG/2ML IJ SOLN: 25.0000 ug | INTRAMUSCULAR | Status: DC | PRN

## 2015-12-07 MED ORDER — ALBUTEROL SULFATE (2.5 MG/3ML) 0.083% IN NEBU
INHALATION_SOLUTION | RESPIRATORY_TRACT | Status: AC
  Filled 2015-12-07: qty 3

## 2015-12-07 MED ORDER — LIDOCAINE HCL (CARDIAC) 20 MG/ML IV SOLN
INTRAVENOUS | Status: DC | PRN
  Administered 2015-12-07: 60 mg via INTRATRACHEAL
  Administered 2015-12-07: 60 mg via INTRAVENOUS

## 2015-12-07 MED ORDER — ROCURONIUM BROMIDE 100 MG/10ML IV SOLN
INTRAVENOUS | Status: DC | PRN
  Administered 2015-12-07: 5 mg via INTRAVENOUS
  Administered 2015-12-07: 40 mg via INTRAVENOUS

## 2015-12-07 MED ORDER — METOCLOPRAMIDE HCL 5 MG/ML IJ SOLN: 10.0000 mg | Freq: Once | INTRAMUSCULAR | Status: DC | PRN

## 2015-12-07 MED ORDER — PROPOFOL 10 MG/ML IV BOLUS
INTRAVENOUS | Status: DC | PRN
  Administered 2015-12-07: 20 mg via INTRAVENOUS
  Administered 2015-12-07: 120 mg via INTRAVENOUS
  Administered 2015-12-07: 40 mg via INTRAVENOUS

## 2015-12-07 MED ORDER — LACTATED RINGERS IV SOLN
INTRAVENOUS | Status: DC | PRN
  Administered 2015-12-07 (×2): via INTRAVENOUS

## 2015-12-07 MED ORDER — EPHEDRINE 5 MG/ML INJ
INTRAVENOUS | Status: AC
  Filled 2015-12-07: qty 10

## 2015-12-07 MED ORDER — LIDOCAINE 2% (20 MG/ML) 5 ML SYRINGE
INTRAMUSCULAR | Status: AC
  Filled 2015-12-07: qty 10

## 2015-12-07 MED ORDER — ALBUTEROL SULFATE (2.5 MG/3ML) 0.083% IN NEBU
2.5000 mg | INHALATION_SOLUTION | Freq: Once | RESPIRATORY_TRACT | Status: AC
  Administered 2015-12-07: 2.5 mg via RESPIRATORY_TRACT

## 2015-12-07 MED ORDER — DEXAMETHASONE SODIUM PHOSPHATE 10 MG/ML IJ SOLN
INTRAMUSCULAR | Status: DC | PRN
  Administered 2015-12-07: 10 mg via INTRAVENOUS

## 2015-12-07 MED ORDER — ONDANSETRON HCL 4 MG/2ML IJ SOLN
INTRAMUSCULAR | Status: DC | PRN
  Administered 2015-12-07: 4 mg via INTRAVENOUS

## 2015-12-07 MED ORDER — EPINEPHRINE PF 1 MG/ML IJ SOLN
INTRAMUSCULAR | Status: AC
  Filled 2015-12-07: qty 1

## 2015-12-07 MED ORDER — FENTANYL CITRATE (PF) 100 MCG/2ML IJ SOLN
INTRAMUSCULAR | Status: DC | PRN
  Administered 2015-12-07: 100 ug via INTRAVENOUS

## 2015-12-07 SURGICAL SUPPLY — 39 items
ADAPTER BRONCH F/PENTAX (ADAPTER) ×3 IMPLANT
BRUSH BIOPSY BRONCH 10 SDTNB (MISCELLANEOUS) ×2 IMPLANT
BRUSH BIOPSY BRONCH 10MM SDTNB (MISCELLANEOUS) ×1
BRUSH CYTOL CELLEBRITY 1.5X140 (MISCELLANEOUS) IMPLANT
BRUSH SUPERTRAX BIOPSY (INSTRUMENTS) IMPLANT
BRUSH SUPERTRAX NDL-TIP CYTO (INSTRUMENTS) ×6 IMPLANT
CANISTER SUCTION 2500CC (MISCELLANEOUS) ×3 IMPLANT
CHANNEL WORK EXTEND EDGE 180 (KITS) ×3 IMPLANT
CHANNEL WORK EXTEND EDGE 45 (KITS) IMPLANT
CHANNEL WORK EXTEND EDGE 90 (KITS) IMPLANT
CONT SPEC 4OZ CLIKSEAL STRL BL (MISCELLANEOUS) ×3 IMPLANT
COVER TABLE BACK 60X90 (DRAPES) ×3 IMPLANT
FILTER STRAW FLUID ASPIR (MISCELLANEOUS) IMPLANT
FORCEPS BIOP SUPERTRX PREMAR (INSTRUMENTS) ×3 IMPLANT
GAUZE SPONGE 4X4 12PLY STRL (GAUZE/BANDAGES/DRESSINGS) ×3 IMPLANT
GLOVE BIO SURGEON STRL SZ7.5 (GLOVE) ×6 IMPLANT
GLOVE SURG SS PI 7.0 STRL IVOR (GLOVE) ×3 IMPLANT
GOWN STRL REUS W/TWL XL LVL4 (GOWN DISPOSABLE) ×3 IMPLANT
KIT CLEAN ENDO COMPLIANCE (KITS) ×3 IMPLANT
KIT LOCATABLE GUIDE (CANNULA) IMPLANT
KIT MARKER FIDUCIAL DELIVERY (KITS) IMPLANT
KIT PROCEDURE EDGE 180 (KITS) ×3 IMPLANT
KIT PROCEDURE EDGE 45 (KITS) IMPLANT
KIT PROCEDURE EDGE 90 (KITS) IMPLANT
KIT ROOM TURNOVER OR (KITS) ×3 IMPLANT
MARKER SKIN DUAL TIP RULER LAB (MISCELLANEOUS) ×3 IMPLANT
NEEDLE SUPERTRX PREMARK BIOPSY (NEEDLE) ×3 IMPLANT
NS IRRIG 1000ML POUR BTL (IV SOLUTION) ×3 IMPLANT
OIL SILICONE PENTAX (PARTS (SERVICE/REPAIRS)) ×3 IMPLANT
PAD ARMBOARD 7.5X6 YLW CONV (MISCELLANEOUS) ×6 IMPLANT
PATCHES PATIENT (LABEL) ×9 IMPLANT
SYR 20CC LL (SYRINGE) ×3 IMPLANT
SYR 20ML ECCENTRIC (SYRINGE) ×3 IMPLANT
SYR 50ML SLIP (SYRINGE) ×3 IMPLANT
TOWEL OR 17X24 6PK STRL BLUE (TOWEL DISPOSABLE) ×3 IMPLANT
TRAP SPECIMEN MUCOUS 40CC (MISCELLANEOUS) IMPLANT
TUBE CONNECTING 20'X1/4 (TUBING) ×1
TUBE CONNECTING 20X1/4 (TUBING) ×2 IMPLANT
WATER STERILE IRR 1000ML POUR (IV SOLUTION) ×3 IMPLANT

## 2015-12-07 NOTE — Anesthesia Procedure Notes (Signed)
Procedure Name: Intubation Date/Time: 12/07/2015 8:37 AM Performed by: Myna Bright Pre-anesthesia Checklist: Patient identified, Emergency Drugs available, Suction available and Patient being monitored Patient Re-evaluated:Patient Re-evaluated prior to inductionOxygen Delivery Method: Circle system utilized Preoxygenation: Pre-oxygenation with 100% oxygen Intubation Type: IV induction Ventilation: Mask ventilation without difficulty Laryngoscope Size: Mac and 3 Grade View: Grade I Tube type: Oral Tube size: 8.5 mm Number of attempts: 1 Airway Equipment and Method: Stylet and LTA kit utilized Placement Confirmation: ETT inserted through vocal cords under direct vision,  positive ETCO2 and breath sounds checked- equal and bilateral Secured at: 21 cm Tube secured with: Tape Dental Injury: Teeth and Oropharynx as per pre-operative assessment

## 2015-12-07 NOTE — Op Note (Signed)
Video Bronchoscopy with Electromagnetic Navigation Procedure Note  Date of Operation: 12/07/2015  Pre-op Diagnosis: Pulmonary nodules  Post-op Diagnosis: Same  Surgeon: Baltazar Apo  Assistants: None  Anesthesia: General endotracheal anesthesia  Operation: Flexible video fiberoptic bronchoscopy with electromagnetic navigation and biopsies.  Estimated Blood Loss: Minimal  Complications: None apparent  Indications and History: [name] is a 81 y.o. female with Hx tobacco use and breast cancer. She has B pulmonary nodules tat are hypermetabolic on PET scan. Recommendation was made to pursue tissue dx via navigational bronchoscopy and biopsies.  The risks, benefits, complications, treatment options and expected outcomes were discussed with the patient.  The possibilities of pneumothorax, pneumonia, reaction to medication, pulmonary aspiration, perforation of a viscus, bleeding, failure to diagnose a condition and creating a complication requiring transfusion or operation were discussed with the patient who freely signed the consent.    Description of Procedure: The patient was seen in the Preoperative Area, was examined and was deemed appropriate to proceed.  The patient was taken to OR 10, identified as [name] and the procedure verified as Flexible Video Fiberoptic Bronchoscopy.  A Time Out was held and the above information confirmed.   Prior to the date of the procedure a high-resolution CT scan of the chest was performed. Utilizing [place] a virtual tracheobronchial tree was generated to allow the creation of distinct navigation pathways to the patient's parenchymal abnormalities. After being taken to the operating room general anesthesia was initiated and the patient  was orally intubated. The video fiberoptic bronchoscope was introduced via the endotracheal tube and a general inspection was performed which showed Normal airway anatomy throughout. The extendable  working channel and locator guide were introduced into the bronchoscope. The distinct navigation pathways prepared prior to this procedure were then utilized to navigate to within 0.5cm of patient's lesion(s) identified on CT scan. The extendable working channel was secured into place and the locator guide was withdrawn. Under fluoroscopic guidance transbronchial needle brushings, transbronchial Wang needle biopsies, and transbronchial forceps biopsies were performed at RLL nodule (target 1), RML nodule ( target 2) and LLL nodule (target 3) to be sent for cytology and pathology. A bronchioalveolar lavage was performed in the RML and sent for cytology and microbiology (bacterial, fungal, AFB smears and cultures). At the end of the procedure a general airway inspection was performed and there was no evidence of active bleeding. The bronchoscope was removed.  The patient tolerated the procedure well. There was no significant blood loss and there were no obvious complications. A post-procedural chest x-ray is pending.  Samples: 1. Transbronchial needle brushings from RLL nodule, RML nodule, LLL nodule 2. Transbronchial Wang needle biopsies from RML nodule and LLL nodule 3. Transbronchial forceps biopsies from RLL nodule, RML nodule, LLL nodule 4. Bronchoalveolar lavage from RML  Plans:  The patient will be discharged from the PACU to home when recovered from anesthesia and after chest x-ray is reviewed. We will review the cytology, pathology and microbiology results with the patient when they become available. Outpatient followup will be with Dr Annamaria Boots or Dr Lamonte Sakai.    Baltazar Apo, MD, PhD 12/07/2015, 10:23 AM [facility] [phone] or if no answer [phone]

## 2015-12-07 NOTE — Discharge Instructions (Signed)
Flexible Bronchoscopy, Care After These instructions give you information on caring for yourself after your procedure. Your doctor may also give you more specific instructions. Call your doctor if you have any problems or questions after your procedure. HOME CARE  Do not eat or drink anything for 2 hours after your procedure. If you try to eat or drink before the medicine wears off, food or drink could go into your lungs. You could also burn yourself.  After 2 hours have passed and when you can cough and gag normally, you may eat soft food and drink liquids slowly.  The day after the test, you may eat your normal diet.  You may do your normal activities.  Keep all doctor visits. GET HELP RIGHT AWAY IF:  You get more and more short of breath.  You get light-headed.  You feel like you are going to pass out (faint).  You have chest pain.  You have new problems that worry you.  You cough up more than a little blood.  You cough up more blood than before. MAKE SURE YOU:  Understand these instructions.  Will watch your condition.  Will get help right away if you are not doing well or get worse.   This information is not intended to replace advice given to you by your health care provider. Make sure you discuss any questions you have with your health care provider.  Please call our office with any questions or concerns. [phone].   Document Released: 12/10/2008 Document Revised: 02/17/2013 Document Reviewed: 10/17/2012 Elsevier Interactive Patient Education [employer].

## 2015-12-07 NOTE — Transfer of Care (Signed)
Immediate Anesthesia Transfer of Care Note  Patient: [name]  Procedure(s) Performed: Procedure(s): VIDEO BRONCHOSCOPY WITH ENDOBRONCHIAL NAVIGATION (N/A)  Patient Location: PACU  Anesthesia Type:General  Level of Consciousness: awake, alert , oriented and patient cooperative  Airway & Oxygen Therapy: Patient Spontanous Breathing  Post-op Assessment: Report given to RN, Post -op Vital signs reviewed and stable and Patient moving all extremities  Post vital signs: Reviewed and stable  Last Vitals:  Vitals:   12/07/15 0711  BP: (!) 179/53  Pulse: 63  Resp: 18  Temp: 36.6 C    Last Pain: There were no vitals filed for this visit.       Complications: No apparent anesthesia complications

## 2015-12-07 NOTE — Anesthesia Preprocedure Evaluation (Signed)
Anesthesia Evaluation  Patient identified by MRN, date of birth, ID band Patient awake    Reviewed: Allergy & Precautions, NPO status , Patient's Chart, lab work & pertinent test results  Airway Mallampati: II  TM Distance: >3 FB Neck ROM: Full    Dental  (+) Upper Dentures   Pulmonary shortness of breath, pneumonia, resolved, COPD,  COPD inhaler, former smoker,  Lung mass   breath sounds clear to auscultation + decreased breath sounds      Cardiovascular hypertension, Pt. on medications Normal cardiovascular exam+ dysrhythmias Atrial Fibrillation  Rhythm:Regular Rate:Normal + Systolic murmurs Hx/o AS-mild   Neuro/Psych PSYCHIATRIC DISORDERS Anxiety Depression Peripheral neuropathy Left lower leg weakness  Neuromuscular disease CVA, Residual Symptoms    GI/Hepatic Neg liver ROS, IBS   Endo/Other  Hypothyroidism Hyperlipidemia  Renal/GU negative Renal ROS  negative genitourinary   Musculoskeletal  (+) Arthritis , Osteoarthritis,  Fibromyalgia -  Abdominal   Peds  Hematology negative hematology ROS (+)   Anesthesia Other Findings   Reproductive/Obstetrics                             Lab Results  Component Value Date   WBC 10.3 12/06/2015   HGB 12.1 12/06/2015   HCT 39.3 12/06/2015   MCV 94.5 12/06/2015   PLT 399 12/06/2015     Chemistry      Component Value Date/Time   NA 137 12/06/2015 0914   NA 140 11/10/2015 1342   K 3.9 12/06/2015 0914   K 4.7 11/10/2015 1342   CL 102 12/06/2015 0914   CO2 25 12/06/2015 0914   CO2 27 11/10/2015 1342   BUN 23 (H) 12/06/2015 0914   BUN 27.3 (H) 11/10/2015 1342   CREATININE 0.98 12/06/2015 0914   CREATININE 0.9 11/10/2015 1342      Component Value Date/Time   CALCIUM 9.3 12/06/2015 0914   CALCIUM 9.8 11/10/2015 1342   ALKPHOS 52 12/06/2015 0914   ALKPHOS 73 11/10/2015 1342   AST 21 12/06/2015 0914   AST 14 11/10/2015 1342   ALT 16  12/06/2015 0914   ALT 13 11/10/2015 1342   BILITOT 0.5 12/06/2015 0914   BILITOT <0.30 11/10/2015 1342      Anesthesia Physical Anesthesia Plan  ASA: III  Anesthesia Plan: General   Post-op Pain Management:    Induction: Intravenous  Airway Management Planned:   Additional Equipment:   Intra-op Plan:   Post-operative Plan: Extubation in OR  Informed Consent: I have reviewed the patients History and Physical, chart, labs and discussed the procedure including the risks, benefits and alternatives for the proposed anesthesia with the patient or authorized representative who has indicated his/her understanding and acceptance.   Dental advisory given  Plan Discussed with: Anesthesiologist, CRNA and Surgeon  Anesthesia Plan Comments:         Anesthesia Quick Evaluation

## 2015-12-07 NOTE — H&P (View-Only) (Signed)
Patient ID: [name], female    DOB: 1934/03/09, 81 y.o.   MRN: [id number]  HPI F former smoker followed for dyspnea, COPD,  Complicated by hypothyroid, depression, HBP, breast Ca HPI.   11/06/13- 79 yoF former smoker followed for dyspnea, COPD,  Complicated by hypothyroid, depression, HBP, AFib/ Xarelto/ L body CVA-resolved. FOLLOWS FOR: pt c/o occasional SOB with exertion.  Denies cough, congestion.   CXR 05/14/13 IMPRESSION:  Emphysema. No active lung disease.  Electronically Signed  By: Ivar Drape M.D.  On: 05/14/2013 13:00  11/11/14- 79 yoF former smoker followed for dyspnea, COPD,  Complicated by hypothyroid, depression, HBP, AFib/ Xarelto/ L body CVA-resolved Follows For: pt states shes doing well. pt c/o SOB when walks down her driveway but its not unmanagable. pt has no other concerns,  Rarely catches colds. Paces herself. Little wheeze or cough. Gets flu vaccine from her primary physician. Completed pulmonary rehabilitation.  11/21/2015-81 year old female former smoker followed for dyspnea/COPD, bilateral lung nodules complicated by hypothyroid, depression, HBP, A. Fib/Xarelto/,  left body CVA-resolved, Breast CA metastatic FOLLOWS FOR: Pt states she does pretty good overall but has moments of SOB and giving out quickly. Dr Lindi Adie, breast Oncologist, messaged asking about PET or bronch because of lung nodules on chest CT.-  had 12 positive lymph nodes. She denies symptoms of infection-no fever or night sweats, no routine cough. Somewhat easier dyspnea on exertion hills and stairs. Weight is down a few pounds. No chest pain or sense of chest congestion. Not aware of any enlarged nodes, abnormal bruising or bleeding. CT chest 11/10/2015- IMPRESSION: 1. Today's study demonstrates new and enlarging nodular opacities in the lungs bilaterally. While these could certainly be neoplastic, the appearance is most concerning for potential atypical infection, including fungal pneumonia  (e.g., angioinvasive aspergillosis). Clinical correlation for immunosuppression and signs and symptoms of infection is recommended. In the absence of infectious symptoms, metastatic disease would be most likely. 2. Aortic atherosclerosis, in addition to 2 vessel coronary artery disease. Assessment for potential risk factor modification, dietary therapy or pharmacologic therapy may be warranted, if clinically indicated. 3. There are calcifications of the aortic valve. Echocardiographic correlation for evaluation of potential valvular dysfunction may be warranted if clinically indicated. 4. Additional incidental findings, as above. Electronically Signed   By: Vinnie Langton M.D.   On: 11/10/2015 18:49 Lab 11/10/2015-WBC 10,200,Eos normal, chemistries normal  Review of Systems-see HPI Constitutional:   No weight loss, night sweats,  Fevers, chills, fatigue, lassitude. HEENT:   No headaches,  Difficulty swallowing,  Tooth/dental problems,  Sore throat,                No sneezing, itching, ear ache, nasal congestion, post nasal drip,  CV:  No chest pain,  Orthopnea, PND, swelling in lower extremities, anasarca, dizziness, palpitations GI  No heartburn, indigestion, abdominal pain, nausea, vomiting,  Resp: As per HPI.  No excess mucus, no productive cough,  No non-productive cough,  No coughing up of blood.  No change in color of mucus.  No wheezing.    Skin: no rash or lesions. GU:   MS:  No joint pain or swelling.  Psych:  No change in mood or affect. No depression or anxiety.  No memory loss.    Objective:   Physical Exam General- Alert, Oriented, Affect-appropriate, Distress- none acute Skin- rash-none, lesions- none, excoriation- none Lymphadenopathy- none Head- atraumatic            Eyes- Gross vision intact, PERRLA, conjunctivae clear secretions  Ears- Hearing, canals            Nose- Clear, Septal dev, mucus, polyps, erosion, perforation             Throat-  Mallampati II , mucosa clear , drainage- none, tonsils- atrophic Neck- flexible , trachea midline, no stridor , thyroid nl, carotid no bruit Chest - symmetrical excursion , unlabored           Heart/CV- RRR occasional extra beat , + trace AS murmur , no gallop  , no rub, nl s1 s2                           - JVD- none , edema- none, stasis changes- none, varices- none           Lung- +clear/ distant, unlabored , wheeze- none, cough- none , dullness-none, rub- none           Chest wall-  Abd- Br/ Gen/ Rectal- Not done, not indicated Extrem- cyanosis- none, clubbing, none, atrophy- none, strength- nl Neuro- grossly intact to observation

## 2015-12-07 NOTE — Interval H&P Note (Signed)
PCCM Interval Note  81 yo woman, former smoker, with a hx breast CA, HTN, A Fib on xarelto. Breast CA dx in 12/16 and rx with sgy + XRT + hormonal therapy. Has undergone serial CT chest to follow LAD and B lung nodules. Most recent imaging showed an enlargement in B nodules in interval from 1/'17 to 9/'17. PET scan confirmed hypermetabolic activity in the nodules. Recommendation was made to pursue tissue dx via navigational bronchoscopy to discern between metastatic breast CA and primary lung cancer.   Vitals:   12/07/15 0711  BP: (!) 179/53  Pulse: 63  Resp: 18  Temp: 97.8 F (36.6 C)  SpO2: 97%   Gen: Pleasant, elderly woman, in no distress,  normal affect  ENT: No lesions,  mouth clear,  oropharynx clear, no postnasal drip, M3 airway  Lungs: No use of accessory muscles, distant, no wheeze, clear without rales or rhonchi  Cardiovascular: irregular, no murmur or gallops, no peripheral edema  Abdomen: soft and NT, no HSM,  BS normal  Musculoskeletal: No deformities, no cyanosis or clubbing  Neuro: alert, non focal  Skin: Warm, no lesions or rashes    Recent Labs Lab 12/06/15 0914 12/07/15 0659  INR 3.02 1.23    Recent Labs Lab 12/06/15 0914  HGB 12.1  HCT 39.3  WBC 10.3  PLT 399   Plan : FOB + ENB for nodal biopsies, cytology and cx's. Risks and benefits explained to pt and her son, all questions answered. Will proceeds as planned.   Baltazar Apo, MD, PhD 12/07/2015, 8:26 AM [facility] [phone] or if no answer [phone]

## 2015-12-07 NOTE — Anesthesia Postprocedure Evaluation (Signed)
Anesthesia Post Note  Patient: Simona E Helfrich  Procedure(s) Performed: Procedure(s) (LRB): VIDEO BRONCHOSCOPY WITH ENDOBRONCHIAL NAVIGATION (N/A)  Patient location during evaluation: PACU Anesthesia Type: General Level of consciousness: awake and alert Pain management: pain level controlled Vital Signs Assessment: post-procedure vital signs reviewed and stable Respiratory status: spontaneous breathing, nonlabored ventilation, respiratory function stable and patient connected to nasal cannula oxygen Cardiovascular status: blood pressure returned to baseline and stable Postop Assessment: no signs of nausea or vomiting Anesthetic complications: no    Last Vitals:  Vitals:   12/07/15 1144 12/07/15 1150  BP: (!) 120/59 106/66  Pulse: (!) 49 65  Resp: (!) 22 20  Temp: 36.4 C     Last Pain:  Vitals:   12/07/15 1150  PainSc: 0-No pain                 Zenaida Deed

## 2015-12-08 ENCOUNTER — Encounter (HOSPITAL_COMMUNITY): Payer: Self-pay | Admitting: Emergency Medicine

## 2015-12-08 LAB — ACID FAST SMEAR (AFB): ACID FAST SMEAR - AFSCU2: NEGATIVE

## 2015-12-08 LAB — ACID FAST SMEAR (AFB, MYCOBACTERIA)

## 2015-12-09 ENCOUNTER — Telehealth: Payer: Self-pay | Admitting: Emergency Medicine

## 2015-12-09 LAB — CULTURE, RESPIRATORY W GRAM STAIN
Culture: NO GROWTH
Gram Stain: NONE SEEN

## 2015-12-09 LAB — CULTURE, RESPIRATORY

## 2015-12-09 NOTE — Telephone Encounter (Signed)
Pt aware that results for bronch usually take 2 business days and will wait a call back on Monday. Will route to RB and LCL.

## 2015-12-09 NOTE — Telephone Encounter (Signed)
Pt was already made aware that results were not back as of today.

## 2015-12-09 NOTE — Telephone Encounter (Signed)
Please let the patient know that I have been watching for the results but they are not back yet

## 2015-12-12 NOTE — Telephone Encounter (Signed)
Discussed results with patient. All path negative. All cytologies normal with exception of one in the RML that shows "atypical cells". Interpretation difficult. Suspect at this point that we can follow cx data, follow serial Ct chest to see if there is a change that raises clinical suspicion. Alternatively could seek out another biopsy now. I explained these options and that she will follow up w dr Annamaria Boots to discuss.

## 2015-12-14 NOTE — Telephone Encounter (Signed)
[name]- She will need an appointment in next 2 weeks to folow up for lung nodules after bronchoscopy please.

## 2015-12-19 NOTE — Telephone Encounter (Signed)
Spoke with patient- appt has been made for Thursday 12/22/15 at 10:30am for follow up with CY. Nothing more needed at this time.

## 2015-12-22 ENCOUNTER — Ambulatory Visit (INDEPENDENT_AMBULATORY_CARE_PROVIDER_SITE_OTHER): Payer: Medicare Other | Admitting: Internal Medicine

## 2015-12-22 ENCOUNTER — Encounter: Payer: Self-pay | Admitting: Internal Medicine

## 2015-12-22 VITALS — BP 122/70 | HR 59 | Ht 63.0 in | Wt 146.6 lb

## 2015-12-22 DIAGNOSIS — J449 Chronic obstructive pulmonary disease, unspecified: Secondary | ICD-10-CM

## 2015-12-22 DIAGNOSIS — R918 Other nonspecific abnormal finding of lung field: Secondary | ICD-10-CM

## 2015-12-22 NOTE — Progress Notes (Signed)
Patient ID: [name], female    DOB: 07-Feb-1935, 81 y.o.   MRN: [id number]  HPI F former smoker followed for lung nodules, dyspnea, COPD,  Complicated by hypothyroid, depression, HBP, breast Ca HPI.   11/11/14- 81 yoF former smoker followed for dyspnea, COPD,  Complicated by hypothyroid, depression, HBP, AFib/ Xarelto/ L body CVA-resolved Follows For: pt states shes doing well. pt c/o SOB when walks down her driveway but its not unmanagable. pt has no other concerns,  Rarely catches colds. Paces herself. Little wheeze or cough. Gets flu vaccine from her primary physician. Completed pulmonary rehabilitation.  11/21/2015-81 year old female former smoker followed for dyspnea/COPD, bilateral lung nodules complicated by hypothyroid, depression, HBP, A. Fib/Xarelto/,  left body CVA-resolved, Breast CA metastatic FOLLOWS FOR: Pt states she does pretty good overall but has moments of SOB and giving out quickly. Dr Lindi Adie, breast Oncologist, messaged asking about PET or bronch because of lung nodules on chest CT.-  had 12 positive lymph nodes. She denies symptoms of infection-no fever or night sweats, no routine cough. Somewhat easier dyspnea on exertion hills and stairs. Weight is down a few pounds. No chest pain or sense of chest congestion. Not aware of any enlarged nodes, abnormal bruising or bleeding. CT chest 11/10/2015- IMPRESSION: 1. Today's study demonstrates new and enlarging nodular opacities in the lungs bilaterally. While these could certainly be neoplastic, the appearance is most concerning for potential atypical infection, including fungal pneumonia (e.g., angioinvasive aspergillosis). Clinical correlation for immunosuppression and signs and symptoms of infection is recommended. In the absence of infectious symptoms, metastatic disease would be most likely. 2. Aortic atherosclerosis, in addition to 2 vessel coronary artery disease. Assessment for potential risk factor modification,  dietary therapy or pharmacologic therapy may be warranted, if clinically indicated. 3. There are calcifications of the aortic valve. Echocardiographic correlation for evaluation of potential valvular dysfunction may be warranted if clinically indicated. 4. Additional incidental findings, as above. Electronically Signed   By: Vinnie Langton M.D.   On: 11/10/2015 18:49 Lab 11/10/2015-WBC 10,200,Eos normal, chemistries normal  12/22/2015-81 year old female former smoker followed for dyspnea/COPD, bilateral lung nodules complicated by hypothyroid, depression, HBP, A. Fib/Xarelto/,  left body CVA-resolved, Breast CA metastatic Guided bronchoscopy 12/07/2015/Dr. Byrum- benign/nondiagnostic on report. All cultures negative to date with 6 week follow-up reports pending. She feels fine without chest pain, cough, fever or chills. We reviewed images and interpretation from super D chest CT and discussed results, negative to date, from her guided bronchoscopy. We discussed possible infectious agent could be [facility], for which culture results are still pending. She understands malignancy is not ruled out. I suggested possible approaches: [1] watchful waiting with repeat CT in 3 months. [2} repeat bronchoscopy. [3] referral for surgical biopsy consideration. Referral to another institution could also be done. She chooses to wait and repeat CT scan in 3 months. CT Super D 12/02/2015- IMPRESSION: 1. Numerous bilateral pulmonary lesions are stable. 2. Stable postoperative and post radiation changes involving the left breast. 3. No mediastinal or hilar mass or adenopathy. 4. No findings for upper abdominal metastatic disease. Electronically Signed   By: Marijo Sanes M.D.   On: 12/02/2015 18:25 PET scan 11/29/2015- IMPRESSION: 1. Bilateral hypermetabolic pulmonary nodules are concerning for malignancy but cannot exclude infectious process. Recommend bronchoscopy for tissue sampling. 2. Several of the  nodules are slightly increased or new from comparison exam. Rapidly enlarging nodules would favor an infectious process; however cannot exclude malignancy. 3. Hypermetabolic skin thickening of the LEFT breast is  nonspecific may relate to prior therapy. Electronically Signed   By: Suzy Bouchard M.D.   On: 11/29/2015 09:47  Review of Systems-see HPI Constitutional:   No weight loss, night sweats,  Fevers, chills, fatigue, lassitude. HEENT:   No headaches,  Difficulty swallowing,  Tooth/dental problems,  Sore throat,                No sneezing, itching, ear ache, nasal congestion, post nasal drip,  CV:  No chest pain,  Orthopnea, PND, swelling in lower extremities, anasarca, dizziness, palpitations GI  No heartburn, indigestion, abdominal pain, nausea, vomiting,  Resp: As per HPI.  No excess mucus, no productive cough,  No non-productive cough,  No coughing up of blood.  No change in color of mucus.  No wheezing.    Skin: no rash or lesions. GU:   MS:  No joint pain or swelling.  Psych:  No change in mood or affect. No depression or anxiety.  No memory loss.    Objective:   Physical Exam General- Alert, Oriented, Affect-appropriate, Distress- none acute Skin- rash-none, lesions- none, excoriation- none Lymphadenopathy- none Head- atraumatic            Eyes- Gross vision intact, PERRLA, conjunctivae clear secretions            Ears- Hearing, canals            Nose- Clear, Septal dev, mucus, polyps, erosion, perforation             Throat- Mallampati II , mucosa clear , drainage- none, tonsils- atrophic Neck- flexible , trachea midline, no stridor , thyroid nl, carotid no bruit Chest - symmetrical excursion , unlabored           Heart/CV- RRR occasional extra beat , + trace AS murmur , no gallop  , no rub, nl s1 s2                           - JVD- none , edema- none, stasis changes- none, varices- none           Lung- +clear/ distant, unlabored , wheeze- none, cough- none ,  dullness-none, rub- none           Chest wall-  Abd- Br/ Gen/ Rectal- Not done, not indicated Extrem- cyanosis- none, clubbing, none, atrophy- none, strength- nl Neuro- grossly intact to observation

## 2015-12-22 NOTE — Patient Instructions (Addendum)
Order- lab- [facility]    Dx lung nodules  Order- Schedule CT chest, high resolution, ho contrast in 3 months    Dx lung nodules, hx breast Ca  Ask Dr Lindi Adie about mammogram follow-up

## 2015-12-22 NOTE — Assessment & Plan Note (Signed)
No significant cough now and no change in exercise tolerance. She feels comfortable and stable with seasonal weather change so far. Has had flu shot.

## 2015-12-22 NOTE — Assessment & Plan Note (Signed)
Her choice is to wait for follow-up CT scan and watch activity of these nodules. AFB cultures from bronchoscopy may take another month. Plan-lab for QuantiFERON gold. Schedule chest CT in 3 months with office follow-up then.

## 2016-01-06 LAB — FUNGUS CULTURE WITH STAIN

## 2016-01-06 LAB — FUNGAL ORGANISM REFLEX

## 2016-01-06 LAB — FUNGUS CULTURE RESULT

## 2016-01-19 LAB — ACID FAST CULTURE WITH REFLEXED SENSITIVITIES (MYCOBACTERIA): Acid Fast Culture: NEGATIVE

## 2016-01-19 LAB — ACID FAST CULTURE WITH REFLEXED SENSITIVITIES

## 2016-03-26 ENCOUNTER — Ambulatory Visit (INDEPENDENT_AMBULATORY_CARE_PROVIDER_SITE_OTHER)
Admission: RE | Admit: 2016-03-26 | Discharge: 2016-03-26 | Disposition: A | Discharge status: Home / Self Care | Payer: Medicare Other | Source: Ambulatory Visit | Attending: Internal Medicine | Admitting: Internal Medicine

## 2016-03-26 DIAGNOSIS — R918 Other nonspecific abnormal finding of lung field: Secondary | ICD-10-CM

## 2016-03-27 ENCOUNTER — Other Ambulatory Visit: Payer: Self-pay | Admitting: Hematology and Oncology

## 2016-03-27 DIAGNOSIS — C50312 Malignant neoplasm of lower-inner quadrant of left female breast: Secondary | ICD-10-CM

## 2016-03-29 ENCOUNTER — Telehealth: Payer: Self-pay

## 2016-03-29 NOTE — Telephone Encounter (Signed)
Spoke with [name] to let her know that she is due for follow up with Dr.Gudena to discuss her bronchoscopy. [name] still taking anastrozole and is doing very well. [name] not having any current symptoms or issues at this time. [name] was curious to see what her Ct chest showed that was done 1/29. Told [name] that results will be discuss during her appt at her f/u appt.

## 2016-04-03 ENCOUNTER — Ambulatory Visit (HOSPITAL_BASED_OUTPATIENT_CLINIC_OR_DEPARTMENT_OTHER): Payer: Medicare Other | Admitting: Hematology and Oncology

## 2016-04-03 ENCOUNTER — Encounter: Payer: Self-pay | Admitting: Hematology and Oncology

## 2016-04-03 DIAGNOSIS — C50312 Malignant neoplasm of lower-inner quadrant of left female breast: Secondary | ICD-10-CM | POA: Diagnosis not present

## 2016-04-03 DIAGNOSIS — Z170 Estrogen receptor positive status [ER+]: Secondary | ICD-10-CM | POA: Diagnosis not present

## 2016-04-03 NOTE — Assessment & Plan Note (Signed)
Left lumpectomy 03/22/2015: Invasive and in situ lobular carcinoma grade 2, 6 cm, LVI present, posterior lateral and anterior margins broadly involved, 12/12 LN positive with ECE, ER 95%, PR 60%, HER-2 neg ratio 1.31, Ki-67 15%, T3 N3 (stage 3C) Adjuvant radiation therapy 05/06/2015 - 07/07/2015 Left Breast: 50.4 Gy in 28 fractions Left Breast Boost: 14 Gy in 7 fractions (64.4) Left Axilla/SCF: 50.4 Gy in 28 fractions CT chest 11/10/2015: Increase in the number and size of numerous nodular opacities in the lungs bilaterally largest left lower lobe 13 x 28 mm, right lower lobe 18 x 16 mm, no lymphadenopathy Biopsy lung 12/07/2015: Right lower lobe, right middle lobe, left lower lobe: Benign lung tissue with reactive bronchial epithelium  Current treatment: Anastrozole 1 mg daily started 07/28/2015  Anastrozole toxicities: 1. Hair thinning

## 2016-04-03 NOTE — Progress Notes (Signed)
Patient Care Team: [name], MD as PCP - General (Internal Medicine) Alphonsa Overall, MD as Consulting Physician (General Surgery) Nicholas Lose, MD as Consulting Physician (Hematology and Oncology) Gery Pray, MD as Consulting Physician (Radiation Oncology) Sylvan Cheese, NP as Nurse Practitioner (Hematology and Oncology)  DIAGNOSIS:  Encounter Diagnosis  Name Primary?  . Malignant neoplasm of lower-inner quadrant of left breast in female, estrogen receptor positive ([place])     SUMMARY OF ONCOLOGIC HISTORY:   Breast cancer of lower-inner quadrant of left female breast ([place])   01/24/2015 Mammogram    Area of palpable concern left breast lower inner quadrant focal skin thickening with architectural distortion, ultrasound showed 8:30: 1.1 x 1.7 x 0.9 cm abnormality, left axilla to pathologic axillary lymph nodes cortical thickening 1.3 cm, 9 mm      02/07/2015 Initial Diagnosis    Left breast biopsy 8:00: IDC with DCIS grade 2-3, ER 95%, PR 60%, Ki-67 15%, HER-2 negative ratio 1.31; left axillary lymph node biopsy positive for metastatic carcinoma with extracapsular extension      03/22/2015 Surgery    Left lumpectomy: Invasive and in situ lobular carcinoma grade 2, 2 cm, LVI present, posterior lateral and anterior margins broadly involved, 12/12 LN positive with ECE, ER 95%, PR 60%, HER-2 neg ratio 1.31, Ki-67 15%, T3 N3 (stage 3C)      03/31/2015 Imaging    CT CAP and bone scan Postsurgical changes suspected residual enhancing soft tissue in the anterior/lateral left breast worrisome for residual tumor , associated left supraclavicular, subpectoral and left axillary LN, small hilar LN, small lung nodules      05/06/2015 - 07/07/2015 Radiation Therapy    Adjuvant radiation therapy with Dr. Sondra Come. Left Breast: 50.4 Gy in 28 fractions, Left Breast Boost: 14 Gy in 7 fractions (64.4) Left Axilla/SCF: 50.4 Gy in 28 fractions      07/28/2015 -  Anti-estrogen oral therapy   Anastrozole 1 mg daily      11/10/2015 Imaging    Increase in the number and size of numerous nodular opacities in the lungs bilaterally largest left lower lobe 13 x 28 mm, right lower lobe 18 x 16 mm, no lymphadenopathy       12/07/2015 Procedure    Lung biopsy right lower lobe: Benign; right middle lobe biopsy benign, left lower lobe biopsy benign with reactive changes       CHIEF COMPLIANT: Follow-up of breast cancer  INTERVAL HISTORY: [name] is a 82 year old lady with above-mentioned history of breast cancer for which she is currently on anastrozole therapy. She was noted to have lung nodules which were evaluated with a lung biopsy in October 2017 and the results were benign. She subsequently had another CT scan recently and is here today to discuss the results. She is otherwise tolerating anastrozole fairly well. She denies any cough expectoration. Denies any bone pain.  REVIEW OF SYSTEMS:   Constitutional: Denies fevers, chills or abnormal weight loss Eyes: Denies blurriness of vision Ears, nose, mouth, throat, and face: Denies mucositis or sore throat Respiratory: Denies cough, dyspnea or wheezes Cardiovascular: Denies palpitation, chest discomfort Gastrointestinal:  Denies nausea, heartburn or change in bowel habits Skin: Denies abnormal skin rashes Lymphatics: Denies new lymphadenopathy or easy bruising Neurological:Denies numbness, tingling or new weaknesses Behavioral/Psych: Mood is stable, no new changes  Extremities: No lower extremity edema Breast:  denies any pain or lumps or nodules in either breasts All other systems were reviewed with the patient and are negative.  I have reviewed the past medical history, past surgical history, social history and family history with the patient and they are unchanged from previous note.  ALLERGIES:  is allergic to codeine; penicillins; and silvadene [silver sulfadiazine].  MEDICATIONS:  Current Outpatient Prescriptions    Medication Sig Dispense Refill  . acetaminophen (TYLENOL) 325 MG tablet Take 650 mg by mouth every 6 (six) hours as needed (FOR PAIN.). Reported on 04/20/2015    . albuterol (PROVENTIL HFA;VENTOLIN HFA) 108 (90 Base) MCG/ACT inhaler Inhale 2 puffs into the lungs every 6 (six) hours as needed for wheezing or shortness of breath.    . ALPRAZolam (XANAX) 0.5 MG tablet Take 0.5 mg by mouth at bedtime.     [name] amiodarone (PACERONE) 200 MG tablet Take 100 mg by mouth daily.     [name] amLODipine (NORVASC) 5 MG tablet Take 5 mg by mouth daily.  3  . anastrozole (ARIMIDEX) 1 MG tablet TAKE 1 TABLET BY MOUTH EVERY DAY 90 tablet 3  . atorvastatin (LIPITOR) 10 MG tablet Take 10 mg by mouth daily.   2  . calcium carbonate (OS-CAL) 600 MG TABS tablet Take 600 mg by mouth daily with breakfast. Reported on 08/18/2015    . furosemide (LASIX) 40 MG tablet Take 40 mg by mouth daily. Reported on 06/28/2015    . levothyroxine (SYNTHROID, LEVOTHROID) 137 MCG tablet Take 137 mcg by mouth daily before breakfast.    . losartan (COZAAR) 100 MG tablet Take 100 mg by mouth daily.    . magnesium oxide (MAG-OX) 400 MG tablet Take 400 mg by mouth 2 (two) times daily.      . mometasone-formoterol (DULERA) 100-5 MCG/ACT AERO Inhale 2 puffs into the lungs 2 (two) times daily. 1 Inhaler 6  . Multiple Vitamin (MULTIVITAMIN) tablet Take 1 tablet by mouth daily.    . rivaroxaban (XARELTO) 20 MG TABS tablet Take 1 tablet (20 mg total) by mouth daily with supper. Please restart this medication on Friday 12/09/15. 30 tablet 2  . spironolactone (ALDACTONE) 25 MG tablet Take 25 mg by mouth daily.   2  . tiotropium (SPIRIVA) 18 MCG inhalation capsule Place 1 capsule (18 mcg total) into inhaler and inhale daily. 10 capsule 0  . vitamin B-12 (CYANOCOBALAMIN) 1000 MCG tablet Take 1,000 mcg by mouth daily. Reported on 08/18/2015     No current facility-administered medications for this visit.     PHYSICAL EXAMINATION: ECOG PERFORMANCE STATUS: 1 -  Symptomatic but completely ambulatory  Vitals:   04/03/16 1521  BP: (!) 149/63  Pulse: 70  Resp: 17  Temp: 97.4 F (36.3 C)   Filed Weights   04/03/16 1521  Weight: 146 lb 3.2 oz (66.3 kg)    GENERAL:alert, no distress and comfortable SKIN: skin color, texture, turgor are normal, no rashes or significant lesions EYES: normal, Conjunctiva are pink and non-injected, sclera clear OROPHARYNX:no exudate, no erythema and lips, buccal mucosa, and tongue normal  NECK: supple, thyroid normal size, non-tender, without nodularity LYMPH:  no palpable lymphadenopathy in the cervical, axillary or inguinal LUNGS: clear to auscultation and percussion with normal breathing effort HEART: regular rate & rhythm and no murmurs and no lower extremity edema ABDOMEN:abdomen soft, non-tender and normal bowel sounds MUSCULOSKELETAL:no cyanosis of digits and no clubbing  NEURO: alert & oriented x 3 with fluent speech, no focal motor/sensory deficits EXTREMITIES: No lower extremity edema   LABORATORY DATA:  I have reviewed the data as listed   Chemistry  Component Value Date/Time   NA 137 12/06/2015 0914   NA 140 11/10/2015 1342   K 3.9 12/06/2015 0914   K 4.7 11/10/2015 1342   CL 102 12/06/2015 0914   CO2 25 12/06/2015 0914   CO2 27 11/10/2015 1342   BUN 23 (H) 12/06/2015 0914   BUN 27.3 (H) 11/10/2015 1342   CREATININE 0.98 12/06/2015 0914   CREATININE 0.9 11/10/2015 1342      Component Value Date/Time   CALCIUM 9.3 12/06/2015 0914   CALCIUM 9.8 11/10/2015 1342   ALKPHOS 52 12/06/2015 0914   ALKPHOS 73 11/10/2015 1342   AST 21 12/06/2015 0914   AST 14 11/10/2015 1342   ALT 16 12/06/2015 0914   ALT 13 11/10/2015 1342   BILITOT 0.5 12/06/2015 0914   BILITOT <0.30 11/10/2015 1342       Lab Results  Component Value Date   WBC 10.3 12/06/2015   HGB 12.1 12/06/2015   HCT 39.3 12/06/2015   MCV 94.5 12/06/2015   PLT 399 12/06/2015   NEUTROABS 8.2 (H) 11/10/2015    ASSESSMENT  & PLAN:  Breast cancer of lower-inner quadrant of left female breast ([place]) Left lumpectomy 03/22/2015: Invasive and in situ lobular carcinoma grade 2, 6 cm, LVI present, posterior lateral and anterior margins broadly involved, 12/12 LN positive with ECE, ER 95%, PR 60%, HER-2 neg ratio 1.31, Ki-67 15%, T3 N3 (stage 3C) Adjuvant radiation therapy 05/06/2015 - 07/07/2015 Left Breast: 50.4 Gy in 28 fractions Left Breast Boost: 14 Gy in 7 fractions (64.4) Left Axilla/SCF: 50.4 Gy in 28 fractions CT chest 11/10/2015: Increase in the number and size of numerous nodular opacities in the lungs bilaterally largest left lower lobe 13 x 28 mm, right lower lobe 18 x 16 mm, no lymphadenopathy Biopsy lung 12/07/2015: Right lower lobe, right middle lobe, left lower lobe: Benign lung tissue with reactive bronchial epithelium  Current treatment: Anastrozole 1 mg daily started 07/28/2015 CT chest 03/26/2016: Vaccine and waning pattern of nodular and masslike opacities throughout the lungs bilaterally favoring chronic atypical infection, cryptogenic organizing pneumonia or malignancy.  Radiology review: I discussed the CT scan report with the patient. I instructed the patient to follow with pulmonology. At this time we will consider that she does not have metastatic disease.   Anastrozole toxicities: 1. Hair thinning   return to clinic in 1 year for follow-up.    I spent 25 minutes talking to the patient of which more than half was spent in counseling and coordination of care.  Orders Placed This Encounter  Procedures  . MM DIAG BREAST TOMO BILATERAL    Standing Status:   Future    Standing Expiration Date:   06/01/2017    Order Specific Question:   Reason for Exam (SYMPTOM  OR DIAGNOSIS REQUIRED)    Answer:   Annual mammogram with breast cancer history    Order Specific Question:   Preferred imaging location?    Answer:   [facility]   The patient has a good understanding of the overall plan. she  agrees with it. she will call with any problems that may develop before the next visit here.   Rulon Eisenmenger, MD 04/03/16

## 2016-04-12 ENCOUNTER — Encounter: Payer: Self-pay | Admitting: Internal Medicine

## 2016-04-12 ENCOUNTER — Ambulatory Visit (INDEPENDENT_AMBULATORY_CARE_PROVIDER_SITE_OTHER): Payer: Medicare Other | Admitting: Internal Medicine

## 2016-04-12 VITALS — BP 120/76 | HR 80 | Ht 63.0 in | Wt 146.4 lb

## 2016-04-12 DIAGNOSIS — I350 Nonrheumatic aortic (valve) stenosis: Secondary | ICD-10-CM

## 2016-04-12 DIAGNOSIS — C50912 Malignant neoplasm of unspecified site of left female breast: Secondary | ICD-10-CM

## 2016-04-12 DIAGNOSIS — R918 Other nonspecific abnormal finding of lung field: Secondary | ICD-10-CM

## 2016-04-12 DIAGNOSIS — I4891 Unspecified atrial fibrillation: Secondary | ICD-10-CM | POA: Diagnosis not present

## 2016-04-12 DIAGNOSIS — J449 Chronic obstructive pulmonary disease, unspecified: Secondary | ICD-10-CM | POA: Diagnosis not present

## 2016-04-12 MED ORDER — GLYCOPYRROLATE-FORMOTEROL 9-4.8 MCG/ACT IN AERO: 2.0000 | INHALATION_SPRAY | Freq: Two times a day (BID) | RESPIRATORY_TRACT | 0 refills | Status: DC

## 2016-04-12 NOTE — Progress Notes (Signed)
Patient ID: [name], female    DOB: [birth year], 82 y.o.   MRN: [id number]  HPI F former smoker followed for lung nodules, dyspnea, COPD,  Complicated by hypothyroid, depression, HBP, breast Ca female former smoker followed for dyspnea/COPD, bilateral lung nodules complicated by hypothyroid, depression, HBP, A. Fib/Xarelto, Aortic Stenosis,  left body CVA-resolved, Breast CA metastatic CT chest 11/10/2015- new and enlarging nodular opacities in the lungs bilaterally.  Bronchoscopy guided 12/07/15-Dr. Byrum- benign/nondiagnostic on report. All cultures negative ----------------------------------------------------------------------------------------  12/22/2015-82 year old female former smoker followed for dyspnea/COPD, bilateral lung nodules complicated by hypothyroid, depression, HBP, A. Fib/Xarelto/,  left body CVA-resolved, Breast CA metastatic Guided bronchoscopy 12/07/2015/Dr. Byrum- benign/nondiagnostic on report. All cultures negative to date with 6 week follow-up reports pending. She feels fine without chest pain, cough, fever or chills. We reviewed images and interpretation from super D chest CT and discussed results, negative to date, from her guided bronchoscopy. We discussed possible infectious agent could be [facility], for which culture results are still pending. She understands malignancy is not ruled out. I suggested possible approaches: [1] watchful waiting with repeat CT in 3 months. [2} repeat bronchoscopy. [3] referral for surgical biopsy consideration. Referral to another institution could also be done. She chooses to wait and repeat CT scan in 3 months. CT Super D 12/02/2015- IMPRESSION: 1. Numerous bilateral pulmonary lesions are stable. 2. Stable postoperative and post radiation changes involving the left breast. 3. No mediastinal or hilar mass or adenopathy. 4. No findings for upper abdominal metastatic disease. Electronically Signed   By: Marijo Sanes M.D.   On:  12/02/2015 18:25 PET scan 11/29/2015- IMPRESSION: 1. Bilateral hypermetabolic pulmonary nodules are concerning for malignancy but cannot exclude infectious process. Recommend bronchoscopy for tissue sampling. 2. Several of the nodules are slightly increased or new from comparison exam. Rapidly enlarging nodules would favor an infectious process; however cannot exclude malignancy. 3. Hypermetabolic skin thickening of the LEFT breast is nonspecific may relate to prior therapy. Electronically Signed   By: Suzy Bouchard M.D.   On: 11/29/2015 09:47  04/12/2016- 82 year old female former smoker followed for dyspnea/COPD, bilateral lung nodules complicated by hypothyroid, depression, HBP, A. Fib/Xarelto, Aortic Stenosis,  left body CVA-resolved, Breast CA metastatic Guided bronchoscopy 12/07/2015/Dr. Byrum- benign/nondiagnostic on report. All cultures negative to date with 6 week follow-up reports pending. FOLLOWS FOR: Pt continues to have DOE. Review CT Chest with patient We reviewed most recent chest CT with waxing and waning nodules. Pattern favors inflammatory/chronic infection although slow-growing new plasma is not excluded. Little routine cough, no fever or adenopathy. Stable DOE. Using Dulera and Proventil intermittently as rescue inhalers. Sees cardiologist Dr. Terrence Dupont every 3 months watching aortic stenosis and atrial fib/amiodarone  CT chest HR 03/26/16- I personally reviewed images and agree. Some of the left lung scarring may be from radiation.  IMPRESSION: 1. There is a waxing and waning pattern of nodular and mass-like opacities throughout the lungs bilaterally, as has been seen on several prior examinations. Overall, these findings are favored to reflect chronic atypical infection, but could in part be related to an organizing pneumonia (cryptogenic organizing pneumonia (COP)). Strictly speaking, any one or more of these could be neoplastic, however, the time course for the  development and resolution of these nodules does not strongly favored that at this time. 2. Aortic atherosclerosis, in addition to 2 vessel coronary artery disease. Assessment for potential risk factor modification, dietary therapy or pharmacologic therapy may be warranted, if clinically indicated. 3. There are calcifications of  the aortic valve. Echocardiographic correlation for evaluation of potential valvular dysfunction may be warranted if clinically indicated. 4. Additional incidental findings, as above   Review of Systems-see HPI Constitutional:   No weight loss, night sweats,  Fevers, chills, fatigue, lassitude. HEENT:   No headaches,  Difficulty swallowing,  Tooth/dental problems,  Sore throat,                No sneezing, itching, ear ache, nasal congestion, post nasal drip,  CV:  No chest pain,  Orthopnea, PND, swelling in lower extremities, anasarca, dizziness, palpitations GI  No heartburn, indigestion, abdominal pain, nausea, vomiting,  Resp: As per HPI.  No excess mucus, no productive cough,  No non-productive cough,  No coughing up of blood.  No change in color of mucus.  No wheezing.    Skin: no rash or lesions. GU:   MS:  No joint pain or swelling.  Psych:  No change in mood or affect. No depression or anxiety.  No memory loss.    Objective:   Physical Exam General- Alert, Oriented, Affect-appropriate, Distress- none acute, + Smiling looks comfortable Skin- rash-none, lesions- none, excoriation- none Lymphadenopathy- none Head- atraumatic            Eyes- Gross vision intact, PERRLA, conjunctivae clear secretions            Ears- Hearing, canals            Nose- Clear, Septal dev, mucus, polyps, erosion, perforation             Throat- Mallampati II , mucosa clear , drainage- none, tonsils- atrophic Neck- flexible , trachea midline, no stridor , thyroid nl, carotid no bruit Chest - symmetrical excursion , unlabored           Heart/CV- RRR again feels regular to  palpation , + trace AS murmur , no gallop  , no rub, nl s1 s2                           - JVD- none , edema- none, stasis changes- none, varices- none           Lung- +clear/ distant, unlabored , wheeze- none, cough- none , dullness-none, rub- none           Chest wall-  Abd- Br/ Gen/ Rectal- Not done, not indicated Extrem- cyanosis- none, clubbing, none, atrophy- none, strength- nl Neuro- grossly intact to observation

## 2016-04-12 NOTE — Assessment & Plan Note (Signed)
I explained how this might contribute to dyspnea on exertion. She is followed by cardiology.

## 2016-04-12 NOTE — Patient Instructions (Addendum)
Order- schedule  PFT       Dx COPD mixed type  Continue Proventil inhaler (albuterol) as a rescue inhaler used every 6 hours only if needed  Sample Bevespi inhaler    Inhale 2 puffs, twice daily every day    Try this instead of your Dulera and Spiriva. When the sample runs out, go back to [facility] and Spiriva for comparison.  Order- schedule CT chest no contrast     Dx lung nodules, breast cancer

## 2016-04-12 NOTE — Assessment & Plan Note (Addendum)
Based on latest chest CT 03/26/16, we are not seeing a clearly progressive process . Pattern remains indefinite but suggestive of inflammatory (radiologist suggested COP) or slow atypical infection such as MAIC. Possibility of malignancy is understood. Without more definitive symptoms and with the possibility of atypical infection I will am going to wait on steroids. Final cultures were all negative from October bronchoscopy Plan-update chest CT in 6 months.

## 2016-04-12 NOTE — Assessment & Plan Note (Signed)
She is either in sinus rhythm by exam at this visit or ventricular response is very well controlled. She is followed by cardiology.

## 2016-04-12 NOTE — Assessment & Plan Note (Signed)
We discussed updating status. She had had a bronchitis addressed with doxycycline earlier this winter but is at baseline now. Plan-schedule PFT. Replace Dulera with sample bevespi for comparison

## 2016-05-29 ENCOUNTER — Ambulatory Visit
Admission: RE | Admit: 2016-05-29 | Discharge: 2016-05-29 | Disposition: A | Discharge status: Home / Self Care | Payer: Medicare Other | Source: Ambulatory Visit | Attending: Hematology and Oncology | Admitting: Hematology and Oncology

## 2016-05-29 ENCOUNTER — Other Ambulatory Visit: Payer: Self-pay | Admitting: Hematology and Oncology

## 2016-05-29 DIAGNOSIS — Z170 Estrogen receptor positive status [ER+]: Principal | ICD-10-CM

## 2016-05-29 DIAGNOSIS — C50312 Malignant neoplasm of lower-inner quadrant of left female breast: Secondary | ICD-10-CM

## 2016-06-12 ENCOUNTER — Ambulatory Visit
Admission: RE | Admit: 2016-06-12 | Discharge: 2016-06-12 | Disposition: A | Discharge status: Home / Self Care | Payer: Medicare Other | Source: Ambulatory Visit | Attending: Hematology and Oncology | Admitting: Hematology and Oncology

## 2016-06-12 DIAGNOSIS — C50312 Malignant neoplasm of lower-inner quadrant of left female breast: Secondary | ICD-10-CM

## 2016-06-12 DIAGNOSIS — Z170 Estrogen receptor positive status [ER+]: Principal | ICD-10-CM

## 2016-06-13 ENCOUNTER — Other Ambulatory Visit: Payer: Self-pay | Admitting: *Deleted

## 2016-06-13 DIAGNOSIS — C50912 Malignant neoplasm of unspecified site of left female breast: Secondary | ICD-10-CM

## 2016-06-19 ENCOUNTER — Telehealth: Payer: Self-pay | Admitting: Surgery

## 2016-06-20 ENCOUNTER — Encounter: Payer: Self-pay | Admitting: Radiation Oncology

## 2016-06-24 NOTE — Telephone Encounter (Signed)
No information.  Could not cancel note.  Not sure how we got here

## 2016-06-27 ENCOUNTER — Ambulatory Visit
Admission: RE | Admit: 2016-06-27 | Discharge: 2016-06-27 | Disposition: A | Discharge status: Home / Self Care | Payer: Medicare Other | Source: Ambulatory Visit | Attending: Hematology and Oncology | Admitting: Hematology and Oncology

## 2016-06-27 DIAGNOSIS — C50912 Malignant neoplasm of unspecified site of left female breast: Secondary | ICD-10-CM

## 2016-06-27 MED ORDER — GADOBENATE DIMEGLUMINE 529 MG/ML IV SOLN
14.0000 mL | Freq: Once | INTRAVENOUS | Status: AC | PRN
  Administered 2016-06-27: 14 mL via INTRAVENOUS

## 2016-06-28 NOTE — Progress Notes (Signed)
Location of Breast Cancer: Left Breast cancer with possible left breast reoccurance  Histology per Pathology Report:  RECOMMENDED  BUT NO PATHOLOGY AT THIS TIME  Receptor Status: ER(), PR (), Her2-neu (), Ki-() NO PATHOLOGY COMPLETED  Did patient present with symptoms (if so, please note symptoms) or was this found on screening mammography?: Patient presented with skin thickening and redness follow up appointment with Dr Lindi Adie.  He recommended [place] and MRI which were inconclusive, recommends punch biopsy.  See Impression note from MRI. 06/27/2016 IMPRESSION: Post left breast lumpectomy with diffuse non mass enhancement in the lower inner quadrant of the left breast, spanning the full thickness of the breast. It is difficult to discern whether or not this finding is due to the effects of radiation therapy, or lobular cancer recurrence. Additional suspicious skin thickening and enhancement, most prominent in the lower inner quadrant of the left breast. Punch skin biopsy is recommended to establish histologic diagnosis.  No MRI evidence of malignancy in the right breast.  Abnormally rounded 8 mm right axillary lymph node with scant fatty hilum, for which second-look ultrasound is recommended  Past/Anticipated interventions by surgeon, if any:  06/27/2016  MRI Breast Surgical consultation to consider skin punch biopsy of the left breast lower inner quadrant.  Past/Anticipated interventions by medical oncology, if any: Chemotherapy NO  Lymphedema issues, if any:  no  Pain issues, if any:  no   SAFETY ISSUES:  Prior radiation? Yes   Radiation treatment dates:   05/05/2015-07/07/2015  Site/dose: Left Breast: 50.4 Gy in 28 fractions                    Left Breast Boost: 14 Gy in 7 fractions                    Left Axilla/SCF: 50.4 Gy in 28 fractions  Beams/energy: Left Breast: 3D-Conformal Other/ 6X Photon                             Left Breast Boost: Electron Monte Carlo/ 18MeV  Electron                             Left Axilla/SCF: Obliques/ 10X, 6X Photon  Pacemaker/ICD? No  Possible current pregnancy? No  Is the patient on methotrexate? No  Current Complaints / other details:  Patient reports having some discomfort under her arm which improves when she wears a bra.  The skin on her left lower breast is red.  She said it started about a month ago.  She took Keflex and it did not improve the redness.  She will see Dr. Lucia Gaskins in the next week or so.  BP (!) 152/80 (BP Location: Right Arm, Patient Position: Sitting)   Pulse (!) 58   Temp 98.3 F (36.8 C) (Oral)   Ht 5' 3"  (1.6 m)   Wt 148 lb 3.2 oz (67.2 kg)   SpO2 100%   BMI 26.25 kg/m    Wt Readings from Last 3 Encounters:  07/02/16 148 lb 3.2 oz (67.2 kg)  04/12/16 146 lb 6.4 oz (66.4 kg)  04/03/16 146 lb 3.2 oz (66.3 kg)      Smith,Kimberly N, RN 06/28/2016,10:18 AM

## 2016-07-02 ENCOUNTER — Encounter: Payer: Self-pay | Admitting: Radiation Oncology

## 2016-07-02 ENCOUNTER — Ambulatory Visit
Admission: RE | Admit: 2016-07-02 | Discharge: 2016-07-02 | Disposition: A | Discharge status: Home / Self Care | Payer: Medicare Other | Source: Ambulatory Visit | Attending: Radiation Oncology | Admitting: Radiation Oncology

## 2016-07-02 DIAGNOSIS — C50312 Malignant neoplasm of lower-inner quadrant of left female breast: Secondary | ICD-10-CM | POA: Diagnosis present

## 2016-07-02 DIAGNOSIS — Z880 Allergy status to penicillin: Secondary | ICD-10-CM | POA: Diagnosis not present

## 2016-07-02 DIAGNOSIS — C50912 Malignant neoplasm of unspecified site of left female breast: Secondary | ICD-10-CM | POA: Diagnosis not present

## 2016-07-02 DIAGNOSIS — Z79811 Long term (current) use of aromatase inhibitors: Secondary | ICD-10-CM | POA: Diagnosis not present

## 2016-07-02 DIAGNOSIS — Z9889 Other specified postprocedural states: Secondary | ICD-10-CM | POA: Insufficient documentation

## 2016-07-02 DIAGNOSIS — Z885 Allergy status to narcotic agent status: Secondary | ICD-10-CM | POA: Insufficient documentation

## 2016-07-02 DIAGNOSIS — Z882 Allergy status to sulfonamides status: Secondary | ICD-10-CM | POA: Diagnosis not present

## 2016-07-02 DIAGNOSIS — Z170 Estrogen receptor positive status [ER+]: Secondary | ICD-10-CM | POA: Diagnosis present

## 2016-07-02 DIAGNOSIS — Z7902 Long term (current) use of antithrombotics/antiplatelets: Secondary | ICD-10-CM | POA: Insufficient documentation

## 2016-07-02 NOTE — Progress Notes (Signed)
Please see the Nurse Progress Note in the MD Initial Consult Encounter for this patient. 

## 2016-07-02 NOTE — Progress Notes (Signed)
Radiation Oncology         [phone] ________________________________  Name: [name] MRN: [id number]  Date: 07/02/2016  DOB: 02-08-1935  Re-Consultation Visit Note  CC: [name], MD  Alphonsa Overall, MD    ICD-9-CM ICD-10-CM   1. Malignant neoplasm of lower-inner quadrant of left breast in female, estrogen receptor positive ([place]) 174.3 C50.312    V86.0 Z17.0     Diagnosis:  Stage IIIC (pT3, N3a) invasive and in situ lobular carcinoma of the left breast with positive margins (ER/PR +, HER2 -)  Interval Since Last Radiation: 1 year 05/05/15-07/07/15: 50.4 Gy to the left breast and axilla with 14 Gy boost (64.4 Gy)  Narrative:  The patient returns today for re-evaluation. She was previously diagnosed with stage IIIC invasive and in situ lobular carcinoma of the left breast. She underwent a left lumpectomy on 03/22/15 revealing positive margins. Imaging on 03/31/15 showed small lung nodules. She proceeded to undergo radiation therapy to the left breast and axilla. She is currently taking Anastrozole, started on 07/28/15. Patient presented with skin thickening and redness during a follow up visit with Dr. Lindi Adie on 04/03/16. He recommended [place] and MRI. Ultrasound of the left breast on 06/12/16 showed persistent diffuse redness in the left breast that has not responded to antibiotics and a probably benign mass in the left breast at the o'clock position that appears stable. MRI on 06/27/16 showed diffuse non mass enhancement in the lower inner quadrant of the left breast with additional suspicious skin thickening and enhancement. A punch skin biopsy was recommended to establish histologic diagnosis.  Patient denies lymphedema or pain at this time. She reports discomfort under her arm. Per nursing, the skin on her left lower breast is red. She took Keflex but this did not improve the redness.                 ALLERGIES:  is allergic to codeine; penicillins; and silvadene [silver  sulfadiazine].  Meds: Current Outpatient Prescriptions  Medication Sig Dispense Refill  . acetaminophen (TYLENOL) 325 MG tablet Take 650 mg by mouth every 6 (six) hours as needed (FOR PAIN.). Reported on 04/20/2015    . albuterol (PROVENTIL HFA;VENTOLIN HFA) 108 (90 Base) MCG/ACT inhaler Inhale 2 puffs into the lungs every 6 (six) hours as needed for wheezing or shortness of breath.    [name] amiodarone (PACERONE) 200 MG tablet Take 100 mg by mouth daily.     [name] amLODipine (NORVASC) 5 MG tablet Take 5 mg by mouth daily.  3  . anastrozole (ARIMIDEX) 1 MG tablet TAKE 1 TABLET BY MOUTH EVERY DAY 90 tablet 3  . atorvastatin (LIPITOR) 10 MG tablet Take 10 mg by mouth daily.   2  . calcium carbonate (OS-CAL) 600 MG TABS tablet Take 600 mg by mouth daily with breakfast. Reported on 08/18/2015    . furosemide (LASIX) 40 MG tablet Take 40 mg by mouth daily. Reported on 06/28/2015    . levothyroxine (SYNTHROID, LEVOTHROID) 137 MCG tablet Take 137 mcg by mouth daily before breakfast.    . losartan (COZAAR) 100 MG tablet Take 100 mg by mouth daily.    . magnesium oxide (MAG-OX) 400 MG tablet Take 400 mg by mouth 2 (two) times daily.      . mometasone-formoterol (DULERA) 100-5 MCG/ACT AERO Inhale 2 puffs into the lungs 2 (two) times daily. 1 Inhaler 6  . Multiple Vitamin (MULTIVITAMIN) tablet Take 1 tablet by mouth daily.    . rivaroxaban (XARELTO) 20  MG TABS tablet Take 1 tablet (20 mg total) by mouth daily with supper. Please restart this medication on Friday 12/09/15. 30 tablet 2  . spironolactone (ALDACTONE) 25 MG tablet Take 25 mg by mouth daily.   2  . tiotropium (SPIRIVA) 18 MCG inhalation capsule Place 1 capsule (18 mcg total) into inhaler and inhale daily. 10 capsule 0  . vitamin B-12 (CYANOCOBALAMIN) 1000 MCG tablet Take 1,000 mcg by mouth daily. Reported on 08/18/2015    . Glycopyrrolate-Formoterol (BEVESPI AEROSPHERE) 9-4.8 MCG/ACT AERO Inhale 2 puffs into the lungs 2 (two) times daily. (Patient not  taking: Reported on 07/02/2016) 1 Inhaler 0   No current facility-administered medications for this encounter.     Physical Findings: The patient is in no acute distress. Patient is alert and oriented.  height is 5' 3"  (1.6 m) and weight is 148 lb 3.2 oz (67.2 kg). Her oral temperature is 98.3 F (36.8 C). Her blood pressure is 152/80 (abnormal) and her pulse is 58 (abnormal). Her oxygen saturation is 100%. .  Lungs are clear to auscultation bilaterally. Heart has regular rate and rhythm. No palpable cervical, supraclavicular, or axillary adenopathy. Abdomen soft, non-tender, normal bowel sounds. Diffuse erythema throughout the central portion of the left breast and edema in the left areolar nipple complex. No palpable mass, nipple discharge, or bleeding. Right breast benign.   Lab Findings: Lab Results  Component Value Date   WBC 10.3 12/06/2015   HGB 12.1 12/06/2015   HCT 39.3 12/06/2015   MCV 94.5 12/06/2015   PLT 399 12/06/2015    Radiographic Findings: Mr Breast Bilateral W Wo Contrast  Result Date: 06/27/2016 CLINICAL DATA:  History of treated left breast lobular carcinoma status post lumpectomy and radiation therapy in 2016/2017. Patient complains of left breast skin redness and thickening. LABS:  Creatinine equals 0.9, GFR equals 60. EXAM: BILATERAL BREAST MRI WITH AND WITHOUT CONTRAST TECHNIQUE: Multiplanar, multisequence MR images of both breasts were obtained prior to and following the intravenous administration of 14 ml of MultiHance. THREE-DIMENSIONAL MR IMAGE RENDERING ON INDEPENDENT WORKSTATION: Three-dimensional MR images were rendered by post-processing of the original MR data on an independent workstation. The three-dimensional MR images were interpreted, and findings are reported in the following complete MRI report for this study. Three dimensional images were evaluated at the independent Dyna[employer] workstation COMPARISON:  Previous mammograms. FINDINGS: Breast composition: c.  Heterogeneous fibroglandular tissue. Background parenchymal enhancement: Moderate. Right breast: No mass or abnormal enhancement. Left breast: Patient is status post lumpectomy with healed lumpectomy cavity located in the lower inner quadrant. There is skin thickening and enhancement, most prominent in the lower inner quadrant of the left breast. There is no suspicious mass within the breast parenchyma, however there is diffuse non mass enhancement within the lower inner quadrant spanning the thickness of the breast, and extending to the pectoralis muscle. Lymph nodes: No abnormal appearing left axillary lymph nodes. There is a round 8 mm lymph node in the right axilla, with apparent effacement of its fatty hilum, image 47, axial 20 second post contrast subtracted sequence. Ancillary findings:  None. IMPRESSION: Post left breast lumpectomy with diffuse non mass enhancement in the lower inner quadrant of the left breast, spanning the full thickness of the breast. It is difficult to discern whether or not this finding is due to the effects of radiation therapy, or lobular cancer recurrence. Additional suspicious skin thickening and enhancement, most prominent in the lower inner quadrant of the left breast. Punch skin biopsy is recommended  to establish histologic diagnosis. No MRI evidence of malignancy in the right breast. Abnormally rounded 8 mm right axillary lymph node with scant fatty hilum, for which second-look ultrasound is recommended. RECOMMENDATION: Surgical consultation to consider skin punch biopsy of the left breast lower inner quadrant. Second-look ultrasound of the right axilla to further evaluate borderline abnormally appearing 8 mm lymph node. BI-RADS CATEGORY  4: Suspicious. Electronically Signed   By: Fidela Salisbury M.D.   On: 06/27/2016 15:41   US Breast Ltd Uni Left Inc Axilla  Result Date: 06/12/2016 CLINICAL DATA:  82 year old female presenting for follow-up of skin thickening and  redness in the left breast. She has history of a left breast lumpectomy and axillary lymph node dissection for invasive lobular carcinoma which was performed in December of 2016. Since she was last seen on 05/29/2016 she has completed a ten-day course of Keflex with no improvement of the skin thickening and redness. EXAM: ULTRASOUND OF THE LEFT BREAST COMPARISON:  Previous exam(s). FINDINGS: On physical exam, there is persistent diffuse redness and thickening of the left breast. Targeted ultrasound is performed, showing persistent thickening of the skin. The probably benign complex hypoechoic mass identified in the left breast at 7 o'clock, 2 cm from the nipple measures 2.6 x 1.3 x 1.3 cm, previously measured at 3.1 x 1.5 x 1.3 cm. The overall appearance is not significantly changed. IMPRESSION: 1. The persistent diffuse redness in the left breast is suspicious as it has not responded to antibiotics. 2. The probably benign mass in the left breast at 7 o'clock appears overall stable, and may represent fat necrosis. RECOMMENDATION: Bilateral breast MRI is recommended for further evaluation of the breasts. If there is no target identified for biopsy based on the MRI, I recommend biopsy of the probably benign mass in the left breast at 7 o'clock and a skin punch biopsy to evaluate for inflammatory breast cancer. Ms. Roylance is a patient of Dr. Lucia Gaskins, and we will get in touch with her to schedule a surgical appointment. I have discussed the findings and recommendations with the patient. Results were also provided in writing at the conclusion of the visit. If applicable, a reminder letter will be sent to the patient regarding the next appointment. BI-RADS CATEGORY  4: Suspicious. Electronically Signed   By: Ammie Ferrier M.D.   On: 06/12/2016 12:03    Impression: Stage IIIC (pT3, N3a) invasive and in situ lobular carcinoma of the left breast with positive margins (ER/PR +, HER2 -). There is concern for recurrence  based on imaging and clinical exam versus radiation changes. The patient did have a high dose of radiation to the central breast in light of her positive surgical margins.   Plan:  Patient will proceed with a biopsy under the care of Dr. Lucia Gaskins in the near future. Management details pending biopsy results.   ____________________________________   This document serves as a record of services personally performed by Gery Pray, MD. It was created on his behalf by Bethann Humble, a trained medical scribe. The creation of this record is based on the scribe's personal observations and the provider's statements to them. This document has been checked and approved by the attending provider.

## 2016-07-03 ENCOUNTER — Encounter: Payer: Self-pay | Admitting: Surgery

## 2016-07-04 ENCOUNTER — Other Ambulatory Visit: Payer: Self-pay | Admitting: Surgery

## 2016-07-05 NOTE — Telephone Encounter (Signed)
This encounter was created in error - please disregard.

## 2016-08-28 ENCOUNTER — Other Ambulatory Visit: Payer: Self-pay | Admitting: Surgery

## 2016-08-28 DIAGNOSIS — R599 Enlarged lymph nodes, unspecified: Secondary | ICD-10-CM

## 2016-08-30 ENCOUNTER — Other Ambulatory Visit: Payer: Self-pay | Admitting: Surgery

## 2016-08-30 DIAGNOSIS — C50912 Malignant neoplasm of unspecified site of left female breast: Secondary | ICD-10-CM

## 2016-09-03 ENCOUNTER — Other Ambulatory Visit: Payer: Self-pay | Admitting: Surgery

## 2016-09-03 ENCOUNTER — Other Ambulatory Visit: Payer: Medicare Other

## 2016-09-03 DIAGNOSIS — N6320 Unspecified lump in the left breast, unspecified quadrant: Secondary | ICD-10-CM

## 2016-09-04 ENCOUNTER — Ambulatory Visit
Admission: RE | Admit: 2016-09-04 | Discharge: 2016-09-04 | Disposition: A | Discharge status: Home / Self Care | Payer: Medicare Other | Source: Ambulatory Visit | Attending: Surgery | Admitting: Surgery

## 2016-09-04 ENCOUNTER — Other Ambulatory Visit: Payer: Self-pay | Admitting: Surgery

## 2016-09-04 DIAGNOSIS — C50912 Malignant neoplasm of unspecified site of left female breast: Secondary | ICD-10-CM

## 2016-09-04 DIAGNOSIS — R599 Enlarged lymph nodes, unspecified: Secondary | ICD-10-CM

## 2016-09-05 ENCOUNTER — Other Ambulatory Visit: Payer: Self-pay | Admitting: Surgery

## 2016-09-05 DIAGNOSIS — R599 Enlarged lymph nodes, unspecified: Secondary | ICD-10-CM

## 2016-09-12 ENCOUNTER — Inpatient Hospital Stay: Admission: RE | Admit: 2016-09-12 | Payer: Medicare Other | Source: Ambulatory Visit

## 2016-10-01 ENCOUNTER — Encounter: Payer: Self-pay | Admitting: Hematology and Oncology

## 2016-10-01 ENCOUNTER — Ambulatory Visit (HOSPITAL_BASED_OUTPATIENT_CLINIC_OR_DEPARTMENT_OTHER): Payer: Medicare Other | Admitting: Hematology and Oncology

## 2016-10-01 DIAGNOSIS — Z170 Estrogen receptor positive status [ER+]: Secondary | ICD-10-CM

## 2016-10-01 DIAGNOSIS — C50312 Malignant neoplasm of lower-inner quadrant of left female breast: Secondary | ICD-10-CM

## 2016-10-01 MED ORDER — ANASTROZOLE 1 MG PO TABS: 1.0000 mg | ORAL_TABLET | Freq: Every day | ORAL | 3 refills | Status: DC

## 2016-10-01 NOTE — Assessment & Plan Note (Signed)
Left lumpectomy 03/22/2015: Invasive and in situ lobular carcinoma grade 2, 6 cm, LVI present, posterior lateral and anterior margins broadly involved, 12/12 LN positive with ECE, ER 95%, PR 60%, HER-2 neg ratio 1.31, Ki-67 15%, T3 N3 (stage 3C) Adjuvant radiation therapy 05/06/2015 - 07/07/2015 Left Breast: 50.4 Gy in 28 fractions Left Breast Boost: 14 Gy in 7 fractions (64.4) Left Axilla/SCF: 50.4 Gy in 28 fractions CT chest 11/10/2015: Increase in the number and size of numerous nodular opacities in the lungs bilaterally largest left lower lobe 13 x 28 mm, right lower lobe 18 x 16 mm, no lymphadenopathy Biopsy lung 12/07/2015: Right lower lobe, right middle lobe, left lower lobe: Benign lung tissue with reactive bronchial epithelium  Current treatment:Anastrozole 1 mg daily started 07/28/2015 Anastrozole toxicities: 1. Hair thinning  Lung nodules: CT chest is being scheduled for 10/10/2016 follows with pulmonary  Return to clinic in 1 year for follow-up

## 2016-10-01 NOTE — Progress Notes (Signed)
Patient Care Team: [name], MD as PCP - General (Internal Medicine) Alphonsa Overall, MD as Consulting Physician (General Surgery) Nicholas Lose, MD as Consulting Physician (Hematology and Oncology) Gery Pray, MD as Consulting Physician (Radiation Oncology) Sylvan Cheese, NP as Nurse Practitioner (Hematology and Oncology)  DIAGNOSIS:  Encounter Diagnosis  Name Primary?  . Malignant neoplasm of lower-inner quadrant of left breast in female, estrogen receptor positive ([place])     SUMMARY OF ONCOLOGIC HISTORY:   Breast cancer of lower-inner quadrant of left female breast ([place])   01/24/2015 Mammogram    Area of palpable concern left breast lower inner quadrant focal skin thickening with architectural distortion, ultrasound showed 8:30: 1.1 x 1.7 x 0.9 cm abnormality, left axilla to pathologic axillary lymph nodes cortical thickening 1.3 cm, 9 mm      02/07/2015 Initial Diagnosis    Left breast biopsy 8:00: IDC with DCIS grade 2-3, ER 95%, PR 60%, Ki-67 15%, HER-2 negative ratio 1.31; left axillary lymph node biopsy positive for metastatic carcinoma with extracapsular extension      03/22/2015 Surgery    Left lumpectomy: Invasive and in situ lobular carcinoma grade 2, 2 cm, LVI present, posterior lateral and anterior margins broadly involved, 12/12 LN positive with ECE, ER 95%, PR 60%, HER-2 neg ratio 1.31, Ki-67 15%, T3 N3 (stage 3C)      03/31/2015 Imaging    CT CAP and bone scan Postsurgical changes suspected residual enhancing soft tissue in the anterior/lateral left breast worrisome for residual tumor , associated left supraclavicular, subpectoral and left axillary LN, small hilar LN, small lung nodules      05/06/2015 - 07/07/2015 Radiation Therapy    Adjuvant radiation therapy with Dr. Sondra Come. Left Breast: 50.4 Gy in 28 fractions, Left Breast Boost: 14 Gy in 7 fractions (64.4) Left Axilla/SCF: 50.4 Gy in 28 fractions      07/28/2015 -  Anti-estrogen oral therapy   Anastrozole 1 mg daily      11/10/2015 Imaging    Increase in the number and size of numerous nodular opacities in the lungs bilaterally largest left lower lobe 13 x 28 mm, right lower lobe 18 x 16 mm, no lymphadenopathy       12/07/2015 Procedure    Lung biopsy right lower lobe: Benign; right middle lobe biopsy benign, left lower lobe biopsy benign with reactive changes       CHIEF COMPLIANT: follow-up of breast cancer on anastrozole therapy  INTERVAL HISTORY: [name] is a 82-year-old with above-mentioned history of left breast cancer treated with lumpectomy followed by adjuvant radiation is currently on anastrozole since 2017. She is tolerating anastrozole extremely well apart from hair thinning. She denies any lumps or nodules in breast. She also had lung nodules which were thought to be related to an atypical infection. She follows pulmonary and has a CT scan pending for 10/10/2016.  REVIEW OF SYSTEMS:   Constitutional: Denies fevers, chills or abnormal weight loss Eyes: Denies blurriness of vision Ears, nose, mouth, throat, and face: Denies mucositis or sore throat Respiratory: Denies cough, dyspnea or wheezes Cardiovascular: Denies palpitation, chest discomfort Gastrointestinal:  Denies nausea, heartburn or change in bowel habits Skin: Denies abnormal skin rashes Lymphatics: Denies new lymphadenopathy or easy bruising Neurological:Denies numbness, tingling or new weaknesses Behavioral/Psych: Mood is stable, no new changes  Extremities: No lower extremity edema  All other systems were reviewed with the patient and are negative.  I have reviewed the past medical history, past surgical history, social history  and family history with the patient and they are unchanged from previous note.  ALLERGIES:  is allergic to codeine; penicillins; and silvadene [silver sulfadiazine].  MEDICATIONS:  Current Outpatient Prescriptions  Medication Sig Dispense Refill  . acetaminophen  (TYLENOL) 325 MG tablet Take 650 mg by mouth every 6 (six) hours as needed (FOR PAIN.). Reported on 04/20/2015    . albuterol (PROVENTIL HFA;VENTOLIN HFA) 108 (90 Base) MCG/ACT inhaler Inhale 2 puffs into the lungs every 6 (six) hours as needed for wheezing or shortness of breath.    [name] amiodarone (PACERONE) 200 MG tablet Take 100 mg by mouth daily.     [name] amLODipine (NORVASC) 5 MG tablet Take 5 mg by mouth daily.  3  . anastrozole (ARIMIDEX) 1 MG tablet Take 1 tablet (1 mg total) by mouth daily. 90 tablet 3  . atorvastatin (LIPITOR) 10 MG tablet Take 10 mg by mouth daily.   2  . calcium carbonate (OS-CAL) 600 MG TABS tablet Take 600 mg by mouth daily with breakfast. Reported on 08/18/2015    . furosemide (LASIX) 40 MG tablet Take 40 mg by mouth daily. Reported on 06/28/2015    . Glycopyrrolate-Formoterol (BEVESPI AEROSPHERE) 9-4.8 MCG/ACT AERO Inhale 2 puffs into the lungs 2 (two) times daily. (Patient not taking: Reported on 07/02/2016) 1 Inhaler 0  . levothyroxine (SYNTHROID, LEVOTHROID) 137 MCG tablet Take 137 mcg by mouth daily before breakfast.    . losartan (COZAAR) 100 MG tablet Take 100 mg by mouth daily.    . magnesium oxide (MAG-OX) 400 MG tablet Take 400 mg by mouth 2 (two) times daily.      . mometasone-formoterol (DULERA) 100-5 MCG/ACT AERO Inhale 2 puffs into the lungs 2 (two) times daily. 1 Inhaler 6  . Multiple Vitamin (MULTIVITAMIN) tablet Take 1 tablet by mouth daily.    . rivaroxaban (XARELTO) 20 MG TABS tablet Take 1 tablet (20 mg total) by mouth daily with supper. Please restart this medication on Friday 12/09/15. 30 tablet 2  . spironolactone (ALDACTONE) 25 MG tablet Take 25 mg by mouth daily.   2  . tiotropium (SPIRIVA) 18 MCG inhalation capsule Place 1 capsule (18 mcg total) into inhaler and inhale daily. 10 capsule 0  . vitamin B-12 (CYANOCOBALAMIN) 1000 MCG tablet Take 1,000 mcg by mouth daily. Reported on 08/18/2015     No current facility-administered medications for this  visit.     PHYSICAL EXAMINATION: ECOG PERFORMANCE STATUS: 1 - Symptomatic but completely ambulatory  Vitals:   10/01/16 1545  BP: (!) 161/60  Pulse: 69  Resp: 18  Temp: 98.2 F (36.8 C)   Filed Weights   10/01/16 1545  Weight: 142 lb 6.4 oz (64.6 kg)    GENERAL:alert, no distress and comfortable SKIN: skin color, texture, turgor are normal, no rashes or significant lesions EYES: normal, Conjunctiva are pink and non-injected, sclera clear OROPHARYNX:no exudate, no erythema and lips, buccal mucosa, and tongue normal  NECK: supple, thyroid normal size, non-tender, without nodularity LYMPH:  no palpable lymphadenopathy in the cervical, axillary or inguinal LUNGS: clear to auscultation and percussion with normal breathing effort HEART: regular rate & rhythm and no murmurs and no lower extremity edema ABDOMEN:abdomen soft, non-tender and normal bowel sounds MUSCULOSKELETAL:no cyanosis of digits and no clubbing  NEURO: alert & oriented x 3 with fluent speech, no focal motor/sensory deficits EXTREMITIES: No lower extremity edema  LABORATORY DATA:  I have reviewed the data as listed   Chemistry      Component Value  Date/Time   NA 137 12/06/2015 0914   NA 140 11/10/2015 1342   K 3.9 12/06/2015 0914   K 4.7 11/10/2015 1342   CL 102 12/06/2015 0914   CO2 25 12/06/2015 0914   CO2 27 11/10/2015 1342   BUN 23 (H) 12/06/2015 0914   BUN 27.3 (H) 11/10/2015 1342   CREATININE 0.98 12/06/2015 0914   CREATININE 0.9 11/10/2015 1342      Component Value Date/Time   CALCIUM 9.3 12/06/2015 0914   CALCIUM 9.8 11/10/2015 1342   ALKPHOS 52 12/06/2015 0914   ALKPHOS 73 11/10/2015 1342   AST 21 12/06/2015 0914   AST 14 11/10/2015 1342   ALT 16 12/06/2015 0914   ALT 13 11/10/2015 1342   BILITOT 0.5 12/06/2015 0914   BILITOT <0.30 11/10/2015 1342       Lab Results  Component Value Date   WBC 10.3 12/06/2015   HGB 12.1 12/06/2015   HCT 39.3 12/06/2015   MCV 94.5 12/06/2015   PLT  399 12/06/2015   NEUTROABS 8.2 (H) 11/10/2015    ASSESSMENT & PLAN:  Breast cancer of lower-inner quadrant of left female breast ([place]) Left lumpectomy 03/22/2015: Invasive and in situ lobular carcinoma grade 2, 6 cm, LVI present, posterior lateral and anterior margins broadly involved, 12/12 LN positive with ECE, ER 95%, PR 60%, HER-2 neg ratio 1.31, Ki-67 15%, T3 N3 (stage 3C) Adjuvant radiation therapy 05/06/2015 - 07/07/2015 Left Breast: 50.4 Gy in 28 fractions Left Breast Boost: 14 Gy in 7 fractions (64.4) Left Axilla/SCF: 50.4 Gy in 28 fractions CT chest 11/10/2015: Increase in the number and size of numerous nodular opacities in the lungs bilaterally largest left lower lobe 13 x 28 mm, right lower lobe 18 x 16 mm, no lymphadenopathy Biopsy lung 12/07/2015: Right lower lobe, right middle lobe, left lower lobe: Benign lung tissue with reactive bronchial epithelium  Current treatment:Anastrozole 1 mg daily started 07/28/2015 Anastrozole toxicities: 1. Hair thinning  Lung nodules: CT chest is being scheduled for 10/10/2016 follows with pulmonary  Return to clinic in 1 year for follow-up   I spent 15 minutes talking to the patient of which more than half was spent in counseling and coordination of care.  No orders of the defined types were placed in this encounter.  The patient has a good understanding of the overall plan. she agrees with it. she will call with any problems that may develop before the next visit here.   Rulon Eisenmenger, MD 10/01/16

## 2016-10-10 ENCOUNTER — Ambulatory Visit (INDEPENDENT_AMBULATORY_CARE_PROVIDER_SITE_OTHER)
Admission: RE | Admit: 2016-10-10 | Discharge: 2016-10-10 | Disposition: A | Discharge status: Home / Self Care | Payer: Medicare Other | Source: Ambulatory Visit | Attending: Internal Medicine | Admitting: Internal Medicine

## 2016-10-10 DIAGNOSIS — R918 Other nonspecific abnormal finding of lung field: Secondary | ICD-10-CM

## 2016-10-10 DIAGNOSIS — C50912 Malignant neoplasm of unspecified site of left female breast: Secondary | ICD-10-CM

## 2016-10-11 ENCOUNTER — Ambulatory Visit (INDEPENDENT_AMBULATORY_CARE_PROVIDER_SITE_OTHER): Payer: Medicare Other | Admitting: Internal Medicine

## 2016-10-11 ENCOUNTER — Encounter: Payer: Self-pay | Admitting: Internal Medicine

## 2016-10-11 DIAGNOSIS — J449 Chronic obstructive pulmonary disease, unspecified: Secondary | ICD-10-CM | POA: Diagnosis not present

## 2016-10-11 DIAGNOSIS — I350 Nonrheumatic aortic (valve) stenosis: Secondary | ICD-10-CM | POA: Diagnosis not present

## 2016-10-11 DIAGNOSIS — R918 Other nonspecific abnormal finding of lung field: Secondary | ICD-10-CM

## 2016-10-11 DIAGNOSIS — I480 Paroxysmal atrial fibrillation: Secondary | ICD-10-CM

## 2016-10-11 NOTE — Progress Notes (Signed)
Patient ID: [name], female    DOB: 03-Jan-1935, 82 y.o.   MRN: [id number]  HPI F former smoker followed for lung nodules, dyspnea, COPD,  Complicated by hypothyroid, depression, HBP, breast Ca female former smoker followed for dyspnea/COPD, bilateral lung nodules complicated by hypothyroid, depression, HBP, A. Fib/Xarelto, Aortic Stenosis,  left body CVA-resolved, Breast CA metastatic CT chest 11/10/2015- new and enlarging nodular opacities in the lungs bilaterally.  Bronchoscopy guided 12/07/15-Dr. Byrum- benign/nondiagnostic on report. All cultures negative CT Super D 12/02/2015-1. Bilateral hypermetabolic pulmonary nodules are concerning for malignancy but cannot exclude infectious process PET scan 11/29/2015- Guided bronchoscopy 12/07/2015/Dr. Byrum- benign CT chest HR 03/26/16- waxing and waning pattern of nodular and mass-like opacities throughout the lungs bilaterally, ----------------------------------------------------------------------------------  04/12/2016- 82 year old female former smoker followed for dyspnea/COPD, bilateral lung nodules complicated by hypothyroid, depression, HBP, A. Fib/Xarelto, Aortic Stenosis,  left body CVA-resolved, Breast CA metastatic  benign/nondiagnostic on report. All cultures negative to date with 6 week follow-up reports pending. FOLLOWS FOR: Pt continues to have DOE. Review CT Chest with patient We reviewed most recent chest CT with waxing and waning nodules. Pattern favors inflammatory/chronic infection although slow-growing new plasma is not excluded. Little routine cough, no fever or adenopathy. Stable DOE. Using Dulera and Proventil intermittently as rescue inhalers. Sees cardiologist Dr. Terrence Dupont every 3 months watching aortic stenosis and atrial fib/amiodarone  CT chest HR 03/26/16- I personally reviewed images and agree. Some of the left lung scarring may be from radiation.  IMPRESSION: 1. There is a waxing and waning pattern of nodular and  mass-like opacities throughout the lungs bilaterally, as has been seen on several prior examinations. Overall, these findings are favored to reflect chronic atypical infection, but could in part be related to an organizing pneumonia (cryptogenic organizing pneumonia (COP)). Strictly speaking, any one or more of these could be neoplastic, however, the time course for the development and resolution of these nodules does not strongly favored that at this time. 2. Aortic atherosclerosis, in addition to 2 vessel coronary artery disease. Assessment for potential risk factor modification, dietary therapy or pharmacologic therapy may be warranted, if clinically indicated. 3. There are calcifications of the aortic valve. Echocardiographic correlation for evaluation of potential valvular dysfunction may be warranted if clinically indicated. 4. Additional incidental findings, as above  10/11/16- 82 year old female former smoker followed for dyspnea/COPD, bilateral lung nodules complicated by hypothyroid, depression, HBP,  A. Fib/Xarelto, Aortic Stenosis,  left body CVA-resolved, Breast CA metastatic FOLLOWS FOR: Pt had recent CT Chest completed and will have PFT on Monday 10-15-16. Pt states she gives out quickly but has not worsened since last visit. Little cough or wheeze. Using albuterol rescue inhaler once or twice daily, Dulera and Spiriva. Her PCP gives pneumonia vaccine and she says she has had "all my shots" Oncology scheduled one-year follow-up of her breast cancer CT chest 10/10/16- 1. Significant improved appearance of the lungs when compared to prior examination. No new or worrisome pulmonary lesions or acute overlying pulmonary process. 2. Stable emphysematous changes. 3. A prevascular lymph node has slightly enlarged since the prior CT scans. Recommend attention on future scans. 4. No significant upper abdominal findings. Aortic Atherosclerosis (ICD10-I70.0) and Emphysema  (ICD10-J43.9).  Review of Systems-see HPI  + = pos Constitutional:   No weight loss, night sweats,  Fevers, chills, fatigue, lassitude. HEENT:   No headaches,  Difficulty swallowing,  Tooth/dental problems,  Sore throat,  No sneezing, itching, ear ache, nasal congestion, post nasal drip,  CV:  No chest pain,  Orthopnea, PND, swelling in lower extremities, anasarca, dizziness, palpitations GI  No heartburn, indigestion, abdominal pain, nausea, vomiting,  Resp: + Dyspnea on exertion  No excess mucus, no productive cough,  No non-productive cough,  No coughing up of blood.  No change in color of mucus.  No wheezing.    Skin: no rash or lesions. GU:   MS:  No joint pain or swelling.  Psych:  No change in mood or affect. No depression or anxiety.  No memory loss.    Objective:   Physical Exam General- Alert, Oriented, Affect-appropriate, Distress- none acute, + Smiling looks comfortable Skin- rash-none, lesions- none, excoriation- none Lymphadenopathy- none Head- atraumatic            Eyes- Gross vision intact, PERRLA, conjunctivae clear secretions            Ears- Hearing, canals            Nose- Clear, Septal dev, mucus, polyps, erosion, perforation             Throat- Mallampati II , mucosa clear , drainage- none, tonsils- atrophic Neck- flexible , trachea midline, no stridor , thyroid nl, carotid no bruit Chest - symmetrical excursion , unlabored           Heart/CV- RRR again feels regular to palpation , + 1/6 AS murmur , no gallop  , no rub, nl s1 s2                           - JVD- none , edema- none, stasis changes- none, varices- none           Lung- +clear/ distant, unlabored , wheeze- none, cough- none , dullness-none, rub- none           Chest wall-  Abd- Br/ Gen/ Rectal- Not done, not indicated Extrem- cyanosis- none, clubbing, none, atrophy- none, strength- nl Neuro- grossly intact to observation

## 2016-10-11 NOTE — Patient Instructions (Signed)
Keep appointment for PFT breathing test  We can continue present meds

## 2016-10-14 NOTE — Assessment & Plan Note (Addendum)
She feels stable. No acute change. She is using her inhalers appropriately. Plan-she missed appointment for PFT at this visit and is being rescheduled.

## 2016-10-14 NOTE — Assessment & Plan Note (Signed)
Rhythm feels very regular on exam today, consistent with sinus. Followed by cardiology.

## 2016-10-14 NOTE — Assessment & Plan Note (Signed)
Faint AS murmur noted for documentation

## 2016-10-14 NOTE — Assessment & Plan Note (Signed)
Original question was whether these could be metastases from her breast cancer. Note is made that latest CT suggested stability with one lymph node to be watched on future imaging.

## 2016-10-15 ENCOUNTER — Ambulatory Visit (INDEPENDENT_AMBULATORY_CARE_PROVIDER_SITE_OTHER): Payer: Medicare Other | Admitting: Internal Medicine

## 2016-10-15 DIAGNOSIS — J449 Chronic obstructive pulmonary disease, unspecified: Secondary | ICD-10-CM

## 2016-10-15 LAB — PULMONARY FUNCTION TEST
DL/VA % PRED: 77 %
DL/VA: 3.52 ml/min/mmHg/L
DLCO COR % PRED: 51 %
DLCO COR: 11.01 ml/min/mmHg
DLCO UNC % PRED: 51 %
DLCO unc: 11.18 ml/min/mmHg
FEF 25-75 PRE: 0.34 L/s
FEF 25-75 Post: 0.35 L/sec
FEF2575-%CHANGE-POST: 4 %
FEF2575-%PRED-POST: 27 %
FEF2575-%PRED-PRE: 26 %
FEV1-%Change-Post: 0 %
FEV1-%Pred-Post: 52 %
FEV1-%Pred-Pre: 52 %
FEV1-Post: 0.91 L
FEV1-Pre: 0.92 L
FEV1FVC-%CHANGE-POST: 0 %
FEV1FVC-%Pred-Pre: 69 %
FEV6-%CHANGE-POST: 1 %
FEV6-%Pred-Post: 76 %
FEV6-%Pred-Pre: 75 %
FEV6-PRE: 1.68 L
FEV6-Post: 1.7 L
FEV6FVC-%Change-Post: 1 %
FEV6FVC-%PRED-PRE: 100 %
FEV6FVC-%Pred-Post: 101 %
FVC-%Change-Post: 0 %
FVC-%PRED-PRE: 76 %
FVC-%Pred-Post: 75 %
FVC-POST: 1.78 L
FVC-PRE: 1.79 L
POST FEV1/FVC RATIO: 51 %
PRE FEV1/FVC RATIO: 51 %
Post FEV6/FVC ratio: 96 %
Pre FEV6/FVC Ratio: 94 %
RV % pred: 113 %
RV: 2.62 L
TLC % pred: 88 %
TLC: 4.21 L

## 2016-10-15 NOTE — Progress Notes (Signed)
PFT done today. 

## 2016-11-13 ENCOUNTER — Ambulatory Visit
Admission: RE | Admit: 2016-11-13 | Discharge: 2016-11-13 | Disposition: A | Discharge status: Home / Self Care | Payer: Medicare Other | Source: Ambulatory Visit | Attending: Surgery | Admitting: Surgery

## 2016-11-13 DIAGNOSIS — R599 Enlarged lymph nodes, unspecified: Secondary | ICD-10-CM

## 2016-11-13 MED ORDER — GADOBENATE DIMEGLUMINE 529 MG/ML IV SOLN
14.0000 mL | Freq: Once | INTRAVENOUS | Status: AC | PRN
  Administered 2016-11-13: 14 mL via INTRAVENOUS

## 2017-02-11 ENCOUNTER — Ambulatory Visit: Payer: Medicare Other | Admitting: Internal Medicine

## 2017-03-29 ENCOUNTER — Other Ambulatory Visit: Payer: Self-pay | Admitting: Hematology and Oncology

## 2017-03-29 DIAGNOSIS — C50312 Malignant neoplasm of lower-inner quadrant of left female breast: Secondary | ICD-10-CM

## 2017-04-08 ENCOUNTER — Ambulatory Visit: Payer: Medicare Other | Admitting: Internal Medicine

## 2017-04-08 ENCOUNTER — Encounter: Payer: Self-pay | Admitting: Internal Medicine

## 2017-04-08 DIAGNOSIS — R918 Other nonspecific abnormal finding of lung field: Secondary | ICD-10-CM | POA: Diagnosis not present

## 2017-04-08 DIAGNOSIS — J449 Chronic obstructive pulmonary disease, unspecified: Secondary | ICD-10-CM

## 2017-04-08 NOTE — Progress Notes (Signed)
Patient ID: [name], female    DOB: 1935/01/19, 83 y.o.   MRN: [id number]  HPI F former smoker followed for lung nodules, dyspnea, COPD,  Complicated by hypothyroid, depression, HBP, breast Ca female former smoker followed for dyspnea/COPD, bilateral lung nodules complicated by hypothyroid, depression, HBP, A. Fib/Xarelto, Aortic Stenosis,  left body CVA-resolved, Breast CA metastatic CT chest 11/10/2015- new and enlarging nodular opacities in the lungs bilaterally.  Bronchoscopy guided 12/07/15-Dr. Byrum- benign/nondiagnostic on report. All cultures negative CT Super D 12/02/2015-1. Bilateral hypermetabolic pulmonary nodules are concerning for malignancy but cannot exclude infectious process PET scan 11/29/2015- Guided bronchoscopy 12/07/2015/Dr. Byrum- benign CT chest HR 03/26/16- waxing and waning pattern of nodular and mass-like opacities throughout the lungs bilaterally, PFT 10/15/16-moderately severe obstructive airways disease, NR dilator, moderately severe diffusion defect.  FVC 1.78/75%, FEV1 0.91/52%, ratio 0.51, TLC 88%, DLCO 51% ----------------------------------------------------------------------------------  10/11/16- 83 year old female former smoker followed for dyspnea/COPD, bilateral lung nodules complicated by hypothyroid, depression, HBP, A. Fib/Xarelto, Aortic Stenosis,  left body CVA-resolved, Breast CA metastatic FOLLOWS FOR: Pt had recent CT Chest completed and will have PFT on Monday 10-15-16. Pt states she gives out quickly but has not worsened since last visit. Little cough or wheeze. Using albuterol rescue inhaler once or twice daily, Dulera and Spiriva. Her PCP gives pneumonia vaccine and she says she has had "all my shots" Oncology scheduled one-year follow-up of her breast cancer CT chest 10/10/16- 1. Significant improved appearance of the lungs when compared to prior examination. No new or worrisome pulmonary lesions or acute overlying pulmonary process. 2.  Stable emphysematous changes. 3. A prevascular lymph node has slightly enlarged since the prior CT scans. Recommend attention on future scans. 4. No significant upper abdominal findings. Aortic Atherosclerosis (ICD10-I70.0) and Emphysema (ICD10-J43.9).  04/08/17- 83 year old female former smoker followed for dyspnea/COPD, bilateral lung nodules complicated by hypothyroid, depression, HBP, A. Fib/Xarelto, Aortic Stenosis,  left body CVA-resolved, Breast CA  Lmetastatic ----Lung Nodules; Pt has PFT. Continues to have DOE. Pt states weather changes her breathing often as well. Little cough, no phlegm or chest pain.  Using Dulera, Spiriva and occasional albuterol HFA. Oncology and Surgery alternate follow-up after L breast CA/XRT. PFT 10/15/16-moderately severe obstructive airways disease, NR dilator, moderately severe diffusion defect.  FVC 1.78/75%, FEV1 0.91/52%, ratio 0.51, TLC 88%, DLCO 51%  Review of Systems-see HPI  + = pos Constitutional:   No weight loss, night sweats,  Fevers, chills, fatigue, lassitude. HEENT:   No headaches,  Difficulty swallowing,  Tooth/dental problems,  Sore throat,                No sneezing, itching, ear [name], nasal congestion, post nasal drip,  CV:  No chest pain,  Orthopnea, PND, swelling in lower extremities, anasarca, dizziness, palpitations GI  No heartburn, indigestion, abdominal pain, nausea, vomiting,  Resp: + Dyspnea on exertion  No excess mucus, no productive cough,  No non-productive cough,  No coughing up of blood.  No change in color of mucus.  No wheezing.    Skin: no rash or lesions. GU:   MS:  No joint pain or swelling.  Psych:  No change in mood or affect. No depression or anxiety.  No memory loss.    Objective:   Physical Exam General- Alert, Oriented, Affect-appropriate, Distress- none acute, + Smiling looks comfortable Skin- rash-none, lesions- none, excoriation- none Lymphadenopathy- none Head- atraumatic            Eyes- Gross vision  intact, PERRLA, conjunctivae  clear secretions            Ears- Hearing, canals            Nose- Clear, Septal dev, mucus, polyps, erosion, perforation             Throat- Mallampati II , mucosa clear , drainage- none, tonsils- atrophic Neck- flexible , trachea midline, no stridor , thyroid nl, carotid no bruit Chest - symmetrical excursion , unlabored           Heart/CV- RRR again feels regular to palpation , + 1/6 AS murmur , no gallop  , no rub, nl s1 s2                           - JVD- none , edema- none, stasis changes- none, varices- none           Lung- +clear/ distant, unlabored , wheeze- none, cough- none , dullness-none, rub- none           Chest wall-  Abd- Br/ Gen/ Rectal- Not done, not indicated Extrem- cyanosis- none, clubbing, none, atrophy- none, strength- nl Neuro- grossly intact to observation        Patient ID: [name], female    DOB: Jul 04, 1934, 83 y.o.   MRN: [id number]  HPI F former smoker followed for lung nodules, dyspnea, COPD,  Complicated by hypothyroid, depression, HBP, breast Ca female former smoker followed for dyspnea/COPD, bilateral lung nodules complicated by hypothyroid, depression, HBP, A. Fib/Xarelto, Aortic Stenosis,  left body CVA-resolved, Breast CA metastatic CT chest 11/10/2015- new and enlarging nodular opacities in the lungs bilaterally.  Bronchoscopy guided 12/07/15-Dr. Byrum- benign/nondiagnostic on report. All cultures negative CT Super D 12/02/2015-1. Bilateral hypermetabolic pulmonary nodules are concerning for malignancy but cannot exclude infectious process PET scan 11/29/2015- Guided bronchoscopy 12/07/2015/Dr. Byrum- benign CT chest HR 03/26/16- waxing and waning pattern of nodular and mass-like opacities throughout the lungs bilaterally, ---------------------------------------------------------------------------------- 10/11/16- 83 year old female former smoker followed for dyspnea/COPD, bilateral lung nodules complicated by  hypothyroid, depression, HBP,  A. Fib/Xarelto, Aortic Stenosis,  left body CVA-resolved, Breast CA metastatic FOLLOWS FOR: Pt had recent CT Chest completed and will have PFT on Monday 10-15-16. Pt states she gives out quickly but has not worsened since last visit. Little cough or wheeze. Using albuterol rescue inhaler once or twice daily, Dulera and Spiriva. Her PCP gives pneumonia vaccine and she says she has had "all my shots" Oncology scheduled one-year follow-up of her breast cancer CT chest 10/10/16- 1. Significant improved appearance of the lungs when compared to prior examination. No new or worrisome pulmonary lesions or acute overlying pulmonary process. 2. Stable emphysematous changes. 3. A prevascular lymph node has slightly enlarged since the prior CT scans. Recommend attention on future scans. 4. No significant upper abdominal findings. Aortic Atherosclerosis (ICD10-I70.0) and Emphysema (ICD10-J43.9).  04/08/17- 83 year old female former smoker followed for dyspnea/COPD, bilateral lung nodules complicated by hypothyroid, depression, HBP, ----Lung Nodules; Pt has PFT. Continues to have DOE. Pt states weather changes her breathing often as well. PFT 10/15/16-moderately severe obstructive disease, no response to bronchodilator, possible restriction, moderate reduction of diffusion      Review of Systems-see HPI  + = pos Constitutional:   No weight loss, night sweats,  Fevers, chills, fatigue, lassitude. HEENT:   No headaches,  Difficulty swallowing,  Tooth/dental problems,  Sore throat,                No sneezing, itching,  ear [name], nasal congestion, post nasal drip,  CV:  No chest pain,  Orthopnea, PND, swelling in lower extremities, anasarca, dizziness, palpitations GI  No heartburn, indigestion, abdominal pain, nausea, vomiting,  Resp: + Dyspnea on exertion  No excess mucus, no productive cough,  No non-productive cough,  No coughing up of blood.  No change in color of mucus.   No wheezing.    Skin: no rash or lesions. GU:   MS:  No joint pain or swelling.  Psych:  No change in mood or affect. No depression or anxiety.  No memory loss.    Objective:   Physical Exam General- Alert, Oriented, Affect-appropriate, Distress- none acute, + Smiling looks comfortable Skin- rash-none, lesions- none, excoriation- none Lymphadenopathy- none Head- atraumatic            Eyes- Gross vision intact, PERRLA, conjunctivae clear secretions            Ears- Hearing, canals            Nose- Clear, Septal dev, mucus, polyps, erosion, perforation             Throat- Mallampati II , mucosa clear , drainage- none, tonsils- atrophic Neck- flexible , trachea midline, no stridor , thyroid nl, carotid no bruit Chest - symmetrical excursion , unlabored           Heart/CV- RRR again feels regular to palpation , + 1/6 AS murmur , no gallop  , no rub, nl s1 s2                           - JVD- none , edema- none, stasis changes- none, varices- none           Lung- +clear/ distant, unlabored , wheeze- none, cough- none , dullness-none, rub- none           Chest wall-  Abd- Br/ Gen/ Rectal- Not done, not indicated Extrem- cyanosis- none, clubbing, none, atrophy- none, strength- nl Neuro- grossly intact to observation

## 2017-04-08 NOTE — Patient Instructions (Signed)
Sample x 2 Symbicort 160 if available     Inhale 2 puffs, then rinse mouth, twice daily   Try this instead of Dulera  If your oncologist hasn't asked for another chest CT by then, we may decide to schedule it.  Please call if we can help

## 2017-04-29 NOTE — Assessment & Plan Note (Signed)
Inflammatory nodules have mostly resolved.  Watching prevascular lymph node described on CT 10/10/16

## 2017-04-29 NOTE — Assessment & Plan Note (Signed)
Moderately severe obstruction but bronchodilator therapy maximized.  Symptoms controlled.  She may eventually require supplemental oxygen. Plan-continue current meds as discussed.

## 2017-07-05 ENCOUNTER — Telehealth: Payer: Self-pay | Admitting: Internal Medicine

## 2017-07-05 MED ORDER — PREDNISONE 10 MG PO TABS: ORAL_TABLET | ORAL | 0 refills | Status: DC

## 2017-07-05 NOTE — Telephone Encounter (Signed)
Called and spoke with pt stating to her CY stated to continue inhalers and that we were also sending a prednisone script to her pharmacy for her.  Verified pt's pharmacy and sent script in for pt. Nothing further needed at this time.

## 2017-07-05 NOTE — Telephone Encounter (Signed)
Called and spoke with pt who stated for about a week she has been having some trouble with breathing and wanted to know if something could be called in to help with her breathing.  Pt denies any complaints of cough or fever, states that she just has trouble with her breathing.  Dr. Annamaria Boots, please advise on recommendations for pt. Thanks!  Allergies  Allergen Reactions  . Codeine Swelling    SWELLING REACTION UNSPECIFIED   . Penicillins Other (See Comments)    UNSPECIFIED REACTION  Has patient had a PCN reaction causing immediate rash, facial/tongue/throat swelling, SOB or lightheadedness with hypotension:unsure Has patient had a PCN reaction causing severe rash involving mucus membranes or skin necrosis:unsure Has patient had a PCN reaction that required hospitalization:NO Has patient had a PCN reaction occurring within the last 10 years:NO If all of the above answers are "NO", then may proceed with Cephalosporin use.   [name] [Silver Sulfadiazine] Rash     Current Outpatient Medications:  .  acetaminophen (TYLENOL) 325 MG tablet, Take 650 mg by mouth every 6 (six) hours as needed (FOR PAIN.). Reported on 04/20/2015, Disp: , Rfl:  .  albuterol (PROVENTIL HFA;VENTOLIN HFA) 108 (90 Base) MCG/ACT inhaler, Inhale 2 puffs into the lungs every 6 (six) hours as needed for wheezing or shortness of breath., Disp: , Rfl:  .  amiodarone (PACERONE) 200 MG tablet, Take 100 mg by mouth daily. , Disp: , Rfl:  .  amLODipine (NORVASC) 5 MG tablet, Take 5 mg by mouth daily., Disp: , Rfl: 3 .  anastrozole (ARIMIDEX) 1 MG tablet, Take 1 tablet (1 mg total) by mouth daily., Disp: 90 tablet, Rfl: 3 .  atorvastatin (LIPITOR) 10 MG tablet, Take 10 mg by mouth daily. , Disp: , Rfl: 2 .  calcium carbonate (OS-CAL) 600 MG TABS tablet, Take 600 mg by mouth daily with breakfast. Reported on 08/18/2015, Disp: , Rfl:  .  folic acid (FOLVITE) 354 MCG tablet, Take 400 mcg by mouth daily., Disp: , Rfl:  .   furosemide (LASIX) 40 MG tablet, Take 40 mg by mouth daily. Reported on 06/28/2015, Disp: , Rfl:  .  levothyroxine (SYNTHROID, LEVOTHROID) 125 MCG tablet, Take 1 tablet by mouth daily., Disp: , Rfl: 3 .  losartan (COZAAR) 100 MG tablet, Take 100 mg by mouth daily., Disp: , Rfl:  .  magnesium oxide (MAG-OX) 400 MG tablet, Take 400 mg by mouth 2 (two) times daily.  , Disp: , Rfl:  .  mometasone-formoterol (DULERA) 100-5 MCG/ACT AERO, Inhale 2 puffs into the lungs 2 (two) times daily., Disp: 1 Inhaler, Rfl: 6 .  Multiple Vitamin (MULTIVITAMIN) tablet, Take 1 tablet by mouth daily., Disp: , Rfl:  .  rivaroxaban (XARELTO) 20 MG TABS tablet, Take 1 tablet (20 mg total) by mouth daily with supper. Please restart this medication on Friday 12/09/15., Disp: 30 tablet, Rfl: 2 .  spironolactone (ALDACTONE) 25 MG tablet, Take 25 mg by mouth daily. , Disp: , Rfl: 2 .  tiotropium (SPIRIVA) 18 MCG inhalation capsule, Place 1 capsule (18 mcg total) into inhaler and inhale daily., Disp: 10 capsule, Rfl: 0 .  vitamin B-12 (CYANOCOBALAMIN) 1000 MCG tablet, Take 1,000 mcg by mouth daily. Reported on 08/18/2015, Disp: , Rfl:

## 2017-07-05 NOTE — Telephone Encounter (Signed)
Continue routine inhalers  Offer prednisone 10 mg, # 20, 4 X 2 DAYS, 3 X 2 DAYS, 2 X 2 DAYS, 1 X 2 DAYS

## 2017-07-29 ENCOUNTER — Telehealth: Payer: Self-pay | Admitting: Internal Medicine

## 2017-07-29 MED ORDER — PREDNISONE 10 MG PO TABS: ORAL_TABLET | ORAL | 0 refills | Status: DC

## 2017-07-29 NOTE — Telephone Encounter (Signed)
Offer prednisone 10 mg, # 20, 4 X 2 DAYS, 3 X 2 DAYS, 2 X 2 DAYS, 1 X 2 DAYS  

## 2017-07-29 NOTE — Telephone Encounter (Signed)
Called and spoke to patient. Advised patient of CY's recommendations. Patient requested pharmacy to be [facility]. Rx sent. Nothing further needed at this time.

## 2017-07-29 NOTE — Telephone Encounter (Signed)
Called and spoke to patient. Patient states she has been having episodes of shortness of breath and she reports she panics when this happens and then it gets worse. Patient stated she is using her albuterol inhaler 4-5 times a day. Patient stated last time prednisone helped her a lot.  CY please advise.  Allergies  Allergen Reactions  . Codeine Swelling    SWELLING REACTION UNSPECIFIED   . Penicillins Other (See Comments)    UNSPECIFIED REACTION  Has patient had a PCN reaction causing immediate rash, facial/tongue/throat swelling, SOB or lightheadedness with hypotension:unsure Has patient had a PCN reaction causing severe rash involving mucus membranes or skin necrosis:unsure Has patient had a PCN reaction that required hospitalization:NO Has patient had a PCN reaction occurring within the last 10 years:NO If all of the above answers are "NO", then may proceed with Cephalosporin use.   [name] [Silver Sulfadiazine] Rash   Current Outpatient Medications on File Prior to Visit  Medication Sig Dispense Refill  . acetaminophen (TYLENOL) 325 MG tablet Take 650 mg by mouth every 6 (six) hours as needed (FOR PAIN.). Reported on 04/20/2015    . albuterol (PROVENTIL HFA;VENTOLIN HFA) 108 (90 Base) MCG/ACT inhaler Inhale 2 puffs into the lungs every 6 (six) hours as needed for wheezing or shortness of breath.    [name] amiodarone (PACERONE) 200 MG tablet Take 100 mg by mouth daily.     [name] amLODipine (NORVASC) 5 MG tablet Take 5 mg by mouth daily.  3  . anastrozole (ARIMIDEX) 1 MG tablet Take 1 tablet (1 mg total) by mouth daily. 90 tablet 3  . atorvastatin (LIPITOR) 10 MG tablet Take 10 mg by mouth daily.   2  . calcium carbonate (OS-CAL) 600 MG TABS tablet Take 600 mg by mouth daily with breakfast. Reported on 4/78/2956    . folic acid (FOLVITE) 213 MCG tablet Take 400 mcg by mouth daily.    . furosemide (LASIX) 40 MG tablet Take 40 mg by mouth daily. Reported on 06/28/2015    . levothyroxine  (SYNTHROID, LEVOTHROID) 125 MCG tablet Take 1 tablet by mouth daily.  3  . losartan (COZAAR) 100 MG tablet Take 100 mg by mouth daily.    . magnesium oxide (MAG-OX) 400 MG tablet Take 400 mg by mouth 2 (two) times daily.      . mometasone-formoterol (DULERA) 100-5 MCG/ACT AERO Inhale 2 puffs into the lungs 2 (two) times daily. 1 Inhaler 6  . Multiple Vitamin (MULTIVITAMIN) tablet Take 1 tablet by mouth daily.    . predniSONE (DELTASONE) 10 MG tablet Take 4x2days,3x2days,2x2days,1x2days,then stop 20 tablet 0  . rivaroxaban (XARELTO) 20 MG TABS tablet Take 1 tablet (20 mg total) by mouth daily with supper. Please restart this medication on Friday 12/09/15. 30 tablet 2  . spironolactone (ALDACTONE) 25 MG tablet Take 25 mg by mouth daily.   2  . tiotropium (SPIRIVA) 18 MCG inhalation capsule Place 1 capsule (18 mcg total) into inhaler and inhale daily. 10 capsule 0  . vitamin B-12 (CYANOCOBALAMIN) 1000 MCG tablet Take 1,000 mcg by mouth daily. Reported on 08/18/2015    . [DISCONTINUED] digoxin (LANOXIN) 0.125 MG tablet Take 1 tablet by mouth Daily.     No current facility-administered medications on file prior to visit.

## 2017-08-09 ENCOUNTER — Telehealth: Payer: Self-pay | Admitting: Internal Medicine

## 2017-08-09 MED ORDER — PREDNISONE 10 MG PO TABS: ORAL_TABLET | ORAL | 0 refills | Status: DC

## 2017-08-09 NOTE — Telephone Encounter (Signed)
Prednisone 10 mg, # 20, 4 X 2 DAYS, 3 X 2 DAYS, 2 X 2 DAYS, 1 X 2 DAYS  

## 2017-08-09 NOTE — Telephone Encounter (Signed)
Called and spoke with pt letting her know that we were sending script of prednisone into her pharmacy for her.  Pt expressed understanding. Verified pt's pharmacy and sent Rx in for pt.  Nothing further needed.

## 2017-08-09 NOTE — Telephone Encounter (Signed)
Spoke with [name]. States that she is not feeling well. Reports DOE and chest tightness. Denies wheezing or coughing. Symptoms started over 1 week ago. Offered [name] an appointment today with Aaron Edelman but she refused. [name] would like to have a dose of prednisone sent in.  CY - please advise. Thanks.  Allergies  Allergen Reactions  . Codeine Swelling    SWELLING REACTION UNSPECIFIED   . Penicillins Other (See Comments)    UNSPECIFIED REACTION  Has patient had a PCN reaction causing immediate rash, facial/tongue/throat swelling, SOB or lightheadedness with hypotension:unsure Has patient had a PCN reaction causing severe rash involving mucus membranes or skin necrosis:unsure Has patient had a PCN reaction that required hospitalization:NO Has patient had a PCN reaction occurring within the last 10 years:NO If all of the above answers are "NO", then may proceed with Cephalosporin use.   [name] [Silver Sulfadiazine] Rash   Current Outpatient Medications on File Prior to Visit  Medication Sig Dispense Refill  . acetaminophen (TYLENOL) 325 MG tablet Take 650 mg by mouth every 6 (six) hours as needed (FOR PAIN.). Reported on 04/20/2015    . albuterol (PROVENTIL HFA;VENTOLIN HFA) 108 (90 Base) MCG/ACT inhaler Inhale 2 puffs into the lungs every 6 (six) hours as needed for wheezing or shortness of breath.    [name] amiodarone (PACERONE) 200 MG tablet Take 100 mg by mouth daily.     [name] amLODipine (NORVASC) 5 MG tablet Take 5 mg by mouth daily.  3  . anastrozole (ARIMIDEX) 1 MG tablet Take 1 tablet (1 mg total) by mouth daily. 90 tablet 3  . atorvastatin (LIPITOR) 10 MG tablet Take 10 mg by mouth daily.   2  . calcium carbonate (OS-CAL) 600 MG TABS tablet Take 600 mg by mouth daily with breakfast. Reported on 1/88/4166    . folic acid (FOLVITE) 063 MCG tablet Take 400 mcg by mouth daily.    . furosemide (LASIX) 40 MG tablet Take 40 mg by mouth daily. Reported on 06/28/2015    . levothyroxine (SYNTHROID,  LEVOTHROID) 125 MCG tablet Take 1 tablet by mouth daily.  3  . losartan (COZAAR) 100 MG tablet Take 100 mg by mouth daily.    . magnesium oxide (MAG-OX) 400 MG tablet Take 400 mg by mouth 2 (two) times daily.      . mometasone-formoterol (DULERA) 100-5 MCG/ACT AERO Inhale 2 puffs into the lungs 2 (two) times daily. 1 Inhaler 6  . Multiple Vitamin (MULTIVITAMIN) tablet Take 1 tablet by mouth daily.    . predniSONE (DELTASONE) 10 MG tablet Take 4x2days,3x2days,2x2days,1x2days,then stop 20 tablet 0  . rivaroxaban (XARELTO) 20 MG TABS tablet Take 1 tablet (20 mg total) by mouth daily with supper. Please restart this medication on Friday 12/09/15. 30 tablet 2  . spironolactone (ALDACTONE) 25 MG tablet Take 25 mg by mouth daily.   2  . tiotropium (SPIRIVA) 18 MCG inhalation capsule Place 1 capsule (18 mcg total) into inhaler and inhale daily. 10 capsule 0  . vitamin B-12 (CYANOCOBALAMIN) 1000 MCG tablet Take 1,000 mcg by mouth daily. Reported on 08/18/2015    . [DISCONTINUED] digoxin (LANOXIN) 0.125 MG tablet Take 1 tablet by mouth Daily.     No current facility-administered medications on file prior to visit.

## 2017-08-15 ENCOUNTER — Other Ambulatory Visit: Payer: Self-pay

## 2017-08-15 DIAGNOSIS — Z170 Estrogen receptor positive status [ER+]: Principal | ICD-10-CM

## 2017-08-15 DIAGNOSIS — C50312 Malignant neoplasm of lower-inner quadrant of left female breast: Secondary | ICD-10-CM

## 2017-08-15 MED ORDER — ANASTROZOLE 1 MG PO TABS: 1.0000 mg | ORAL_TABLET | Freq: Every day | ORAL | 3 refills | Status: DC

## 2017-08-21 ENCOUNTER — Telehealth: Payer: Self-pay | Admitting: Internal Medicine

## 2017-08-21 NOTE — Telephone Encounter (Signed)
Spoke with pt. States that she is not feeling well. Reports increased DOE. Denies chest tightness, wheezing or coughing. SOB started a few days ago. Pt would like to have a prescription called in. Advised her that she might need an appointment, she wanted her message to be sent to the doctor.  Dr. Lake Bells - please advise as Dr. Annamaria Boots is not available. Thanks.

## 2017-08-21 NOTE — Telephone Encounter (Signed)
Called and spoke to pt and relayed below recommendations.  I have offered pt an acute visit with Wyn Quaker on 08/23/17. Pt declined, as she prefers to see CY. Pt is aware that Dr. Annamaria Boots will be back in office on 08/26/17.  CY please advise. Thanks

## 2017-08-21 NOTE — Telephone Encounter (Signed)
I've reviewed her records.  Since she has had trouble with these spots on her lungs over the years and has a history of breast cancer I think she should come in to be seen in an acute office visit.

## 2017-08-22 ENCOUNTER — Encounter: Payer: Self-pay | Admitting: Pulmonary Disease

## 2017-08-22 ENCOUNTER — Ambulatory Visit: Payer: Medicare Other | Admitting: Pulmonary Disease

## 2017-08-22 ENCOUNTER — Ambulatory Visit (INDEPENDENT_AMBULATORY_CARE_PROVIDER_SITE_OTHER)
Admission: RE | Admit: 2017-08-22 | Discharge: 2017-08-22 | Disposition: A | Discharge status: Home / Self Care | Payer: Medicare Other | Source: Ambulatory Visit | Attending: Pulmonary Disease | Admitting: Pulmonary Disease

## 2017-08-22 ENCOUNTER — Other Ambulatory Visit (INDEPENDENT_AMBULATORY_CARE_PROVIDER_SITE_OTHER): Payer: Medicare Other

## 2017-08-22 VITALS — BP 102/60 | HR 56 | Ht 63.0 in

## 2017-08-22 DIAGNOSIS — R0602 Shortness of breath: Secondary | ICD-10-CM

## 2017-08-22 DIAGNOSIS — I481 Persistent atrial fibrillation: Secondary | ICD-10-CM

## 2017-08-22 DIAGNOSIS — I4819 Other persistent atrial fibrillation: Secondary | ICD-10-CM

## 2017-08-22 NOTE — Patient Instructions (Signed)
Blood work for thyroid and fluid. Get back on Lasix 40 mg once daily.  I discussed with Dr. Allene Pyo will review your echocardiogram for aortic valve stenosis  We will assess for oxygen  We will hold off on prednisone call if you develop coughing or wheezing Chest x-ray today

## 2017-08-22 NOTE — Telephone Encounter (Signed)
Pt is calling back [phone]

## 2017-08-22 NOTE — Assessment & Plan Note (Signed)
Check chest x-ray and TSH to look for amiodarone toxicity

## 2017-08-22 NOTE — Progress Notes (Signed)
Subjective:    Patient ID: [name], female    DOB: 1934-07-22, 83 y.o.   MRN: [id number]  HPI  83 year old ex-smoker with severe COPD presents for evaluation of dyspnea. She follows with my partner Dr. Annamaria Boots. She also has a history of aortic stenosis and atrial fibrillation maintained on anticoagulation and amiodarone.  She had multiple lung nodules which on follow-up are presumed to be benign, negative biopsy in the past  She has been dyspneic over the last 3 to 4 weeks  I note that prednisone has been called in 3 times for her.  She denies wheezing or sputum production.  She denies preceding URI symptoms.  She had stopped taking her Lasix pills for at least the last 3 weeks. She is also stopped taking Synthroid but has gotten back on it about a week ago when she developed diplopia and saw her ophthalmologist.  Diplopia is persisted especially in looking to her left  There is no history of pedal edema, orthopnea paroxysmal nocturnal dyspnea.  She is dyspneic on routine activities  Oxygen saturation was 91% at rest and dropped to 85% on walking 1 lap around the office and she was noted to be significantly dyspneic   Significant tests/ events reviewed   PFT 10/15/16-moderately severe obstructive airways disease, NR dilator, moderately severe diffusion defect.  FVC 1.78/75%, FEV1 0.91/52%, ratio 0.51, TLC 88%, DLCO 51%  Echo 2014 AVA 0.7 cm2   Past Medical History:  Diagnosis Date  . A-fib ([place])    takes Amiodarone and Xarelto daily  . Allergy history unknown   . Anxiety    takes Ativan daily as needed  . Aortic stenosis    mild 02/2015 (Dr. Terrence Dupont)  . Arthritis   . Breast cancer ([place])   . Breast cancer of lower-inner quadrant of left female breast ([place]) 02/09/2015  . Bruises easily   . COPD (chronic obstructive pulmonary disease) ([facility])    ALdactone and Spirva daily.Dulera daily.Albuterol daily as needed  . COPD, severe   . Depression   . Emphysema (subcutaneous)  (surgical) resulting from a procedure   . Fibromyalgia   . Fibromyalgia   . History of bronchitis 2-3 yrs ago  . History of colon polyps    benign  . Hyperlipidemia    takes Atorvastatin daily  . Hypertension    takes Losartan daily  . Hypothyroidism    takes Synthroid daily  . IBS (irritable bowel syndrome)   . Joint pain   . Osteoarthritis   . Peripheral edema    takes Furosemide and Spirolactone daily  . Peripheral neuropathy   . Pneumonia    hx of-9 yrs ago  . Radiation 05/05/15-07/07/15   left breast 50.4 Gy, left breast boost 14 Gy, left axilla/SCF 50.4 Gy  . Radiation 05/05/15-07/07/15   left breast 50.4 Gy boosted to 14 gy  . Shortness of breath dyspnea    with exertion  . Sinus problem   . Stroke [facility])    affected left leg    Review of Systems neg for any significant sore throat, dysphagia, itching, sneezing, nasal congestion or excess/ purulent secretions, fever, chills, sweats, unintended wt loss, pleuritic or exertional cp, hempoptysis, orthopnea pnd or leg swelling.   Also denies presyncope, palpitations, heartburn, abdominal pain, nausea, vomiting, diarrhea or change in bowel or urinary habits, dysuria,hematuria, rash, arthralgias, visual complaints, headache, numbness weakness or ataxia.     Objective:   Physical Exam  Gen. Pleasant, well-nourished, in no distress, normal affect  ENT - no thrush, no post nasal drip Neck: No JVD, no thyromegaly, no carotid bruits Lungs: no use of accessory muscles, no dullness to percussion, clear without rales or rhonchi  Cardiovascular: Rhythm brady, heart sounds  normal, ESM 3/6 at base, no peripheral edema Abdomen: soft and non-tender, no hepatosplenomegaly, BS normal. Musculoskeletal: No deformities, no cyanosis or clubbing Neuro:  alert, non focal       Assessment & Plan:

## 2017-08-22 NOTE — Assessment & Plan Note (Signed)
Discussed with Dr. Terrence Dupont, aortic valve area was 0.7 cm and echo in 2014 but his recent echo showed aortic valve area of 1.4, he will review echo images

## 2017-08-22 NOTE — Telephone Encounter (Signed)
Called and spoke with pt and she is aware of appt today with RA.  Nothing further is needed.

## 2017-08-22 NOTE — Assessment & Plan Note (Addendum)
Unclear cause of acute respiratory failure, O2 saturation is 91% today.  Does not appear to be obvious COPD exacerbation, no bronchospasm or coughing. Does not appear to be in overt heart failure either. Will check BNP for completion  We will provide her with oxygen to use during ambulation and sleep I offered her hospitalization but she refused

## 2017-08-23 ENCOUNTER — Other Ambulatory Visit: Payer: Self-pay

## 2017-08-23 ENCOUNTER — Ambulatory Visit (INDEPENDENT_AMBULATORY_CARE_PROVIDER_SITE_OTHER)
Admission: RE | Admit: 2017-08-23 | Discharge: 2017-08-23 | Disposition: A | Discharge status: Home / Self Care | Payer: Medicare Other | Source: Ambulatory Visit | Attending: Pulmonary Disease | Admitting: Pulmonary Disease

## 2017-08-23 ENCOUNTER — Telehealth: Payer: Self-pay | Admitting: Internal Medicine

## 2017-08-23 DIAGNOSIS — J9811 Atelectasis: Secondary | ICD-10-CM | POA: Diagnosis not present

## 2017-08-23 LAB — BASIC METABOLIC PANEL
BUN: 18 mg/dL (ref 6–23)
CHLORIDE: 96 meq/L (ref 96–112)
CO2: 23 meq/L (ref 19–32)
CREATININE: 0.79 mg/dL (ref 0.40–1.20)
Calcium: 9.2 mg/dL (ref 8.4–10.5)
GFR: 74.19 mL/min (ref >60.00)
GLUCOSE: 99 mg/dL (ref 70–99)
Potassium: 4.6 mEq/L (ref 3.5–5.1)
Sodium: 133 mEq/L — ABNORMAL LOW (ref 135–145)

## 2017-08-23 LAB — TSH: TSH: 7.42 u[IU]/mL — ABNORMAL HIGH (ref 0.35–4.50)

## 2017-08-23 LAB — BRAIN NATRIURETIC PEPTIDE: Pro B Natriuretic peptide (BNP): 60 pg/mL (ref 0.0–100.0)

## 2017-08-23 MED ORDER — IOPAMIDOL (ISOVUE-300) INJECTION 61%
80.0000 mL | Freq: Once | INTRAVENOUS | Status: AC | PRN
  Administered 2017-08-23: 80 mL via INTRAVENOUS

## 2017-08-23 NOTE — Telephone Encounter (Signed)
rec'd call from [facility] with [facility] for call report She states pt had CT on Chest showing large left plural effusion, and possible underlying of neo plastic involvement.  She is wanting CY to review the results in pt's chart as soon as possible.   CY please advise.

## 2017-08-24 ENCOUNTER — Encounter (HOSPITAL_COMMUNITY): Payer: Self-pay | Admitting: Emergency Medicine

## 2017-08-24 ENCOUNTER — Emergency Department (HOSPITAL_COMMUNITY): Payer: Medicare Other

## 2017-08-24 ENCOUNTER — Inpatient Hospital Stay (HOSPITAL_COMMUNITY)
Admission: EM | Admit: 2017-08-24 | Discharge: 2017-08-25 | DRG: 186 | Disposition: A | Discharge status: Home / Self Care | Payer: Medicare Other | Attending: Internal Medicine | Admitting: Internal Medicine

## 2017-08-24 ENCOUNTER — Other Ambulatory Visit: Payer: Self-pay

## 2017-08-24 DIAGNOSIS — I4891 Unspecified atrial fibrillation: Secondary | ICD-10-CM | POA: Diagnosis present

## 2017-08-24 DIAGNOSIS — Z853 Personal history of malignant neoplasm of breast: Secondary | ICD-10-CM | POA: Diagnosis not present

## 2017-08-24 DIAGNOSIS — Z7901 Long term (current) use of anticoagulants: Secondary | ICD-10-CM | POA: Diagnosis not present

## 2017-08-24 DIAGNOSIS — J441 Chronic obstructive pulmonary disease with (acute) exacerbation: Secondary | ICD-10-CM | POA: Diagnosis not present

## 2017-08-24 DIAGNOSIS — E785 Hyperlipidemia, unspecified: Secondary | ICD-10-CM | POA: Diagnosis present

## 2017-08-24 DIAGNOSIS — I700 Atherosclerosis of aorta: Secondary | ICD-10-CM | POA: Diagnosis present

## 2017-08-24 DIAGNOSIS — Z923 Personal history of irradiation: Secondary | ICD-10-CM | POA: Diagnosis not present

## 2017-08-24 DIAGNOSIS — J90 Pleural effusion, not elsewhere classified: Secondary | ICD-10-CM | POA: Diagnosis present

## 2017-08-24 DIAGNOSIS — Z79811 Long term (current) use of aromatase inhibitors: Secondary | ICD-10-CM

## 2017-08-24 DIAGNOSIS — E039 Hypothyroidism, unspecified: Secondary | ICD-10-CM | POA: Diagnosis present

## 2017-08-24 DIAGNOSIS — F419 Anxiety disorder, unspecified: Secondary | ICD-10-CM | POA: Diagnosis present

## 2017-08-24 DIAGNOSIS — Z8673 Personal history of transient ischemic attack (TIA), and cerebral infarction without residual deficits: Secondary | ICD-10-CM

## 2017-08-24 DIAGNOSIS — J9621 Acute and chronic respiratory failure with hypoxia: Secondary | ICD-10-CM | POA: Diagnosis present

## 2017-08-24 DIAGNOSIS — J439 Emphysema, unspecified: Secondary | ICD-10-CM | POA: Diagnosis present

## 2017-08-24 DIAGNOSIS — R918 Other nonspecific abnormal finding of lung field: Secondary | ICD-10-CM | POA: Diagnosis present

## 2017-08-24 DIAGNOSIS — I350 Nonrheumatic aortic (valve) stenosis: Secondary | ICD-10-CM | POA: Diagnosis present

## 2017-08-24 DIAGNOSIS — I482 Chronic atrial fibrillation: Secondary | ICD-10-CM | POA: Diagnosis not present

## 2017-08-24 DIAGNOSIS — Z9981 Dependence on supplemental oxygen: Secondary | ICD-10-CM | POA: Diagnosis not present

## 2017-08-24 DIAGNOSIS — Z888 Allergy status to other drugs, medicaments and biological substances status: Secondary | ICD-10-CM

## 2017-08-24 DIAGNOSIS — I10 Essential (primary) hypertension: Secondary | ICD-10-CM | POA: Diagnosis present

## 2017-08-24 DIAGNOSIS — R0902 Hypoxemia: Secondary | ICD-10-CM

## 2017-08-24 DIAGNOSIS — Z885 Allergy status to narcotic agent status: Secondary | ICD-10-CM

## 2017-08-24 DIAGNOSIS — J9600 Acute respiratory failure, unspecified whether with hypoxia or hypercapnia: Secondary | ICD-10-CM

## 2017-08-24 DIAGNOSIS — N63 Unspecified lump in unspecified breast: Secondary | ICD-10-CM

## 2017-08-24 DIAGNOSIS — J449 Chronic obstructive pulmonary disease, unspecified: Secondary | ICD-10-CM

## 2017-08-24 DIAGNOSIS — Z87891 Personal history of nicotine dependence: Secondary | ICD-10-CM

## 2017-08-24 DIAGNOSIS — F329 Major depressive disorder, single episode, unspecified: Secondary | ICD-10-CM | POA: Diagnosis present

## 2017-08-24 DIAGNOSIS — Z9889 Other specified postprocedural states: Secondary | ICD-10-CM

## 2017-08-24 DIAGNOSIS — G629 Polyneuropathy, unspecified: Secondary | ICD-10-CM | POA: Diagnosis present

## 2017-08-24 DIAGNOSIS — Z7951 Long term (current) use of inhaled steroids: Secondary | ICD-10-CM

## 2017-08-24 DIAGNOSIS — M1990 Unspecified osteoarthritis, unspecified site: Secondary | ICD-10-CM | POA: Diagnosis present

## 2017-08-24 DIAGNOSIS — Z79899 Other long term (current) drug therapy: Secondary | ICD-10-CM

## 2017-08-24 DIAGNOSIS — M797 Fibromyalgia: Secondary | ICD-10-CM | POA: Diagnosis present

## 2017-08-24 DIAGNOSIS — Z880 Allergy status to penicillin: Secondary | ICD-10-CM

## 2017-08-24 LAB — BASIC METABOLIC PANEL
Anion gap: 12 (ref 5–15)
BUN: 20 mg/dL (ref 8–23)
CALCIUM: 9 mg/dL (ref 8.9–10.3)
CO2: 28 mmol/L (ref 22–32)
Chloride: 96 mmol/L — ABNORMAL LOW (ref 98–111)
Creatinine, Ser: 0.83 mg/dL (ref 0.44–1.00)
GFR calc non Af Amer: >60 mL/min (ref >60)
Glucose, Bld: 107 mg/dL — ABNORMAL HIGH (ref 70–99)
Potassium: 3.8 mmol/L (ref 3.5–5.1)
Sodium: 136 mmol/L (ref 135–145)

## 2017-08-24 LAB — CBC
HCT: 43.6 % (ref 36.0–46.0)
Hemoglobin: 14.6 g/dL (ref 12.0–15.0)
MCH: 34.5 pg — AB (ref 26.0–34.0)
MCHC: 33.5 g/dL (ref 30.0–36.0)
MCV: 103.1 fL — ABNORMAL HIGH (ref 78.0–100.0)
Platelets: 378 10*3/uL (ref 150–400)
RBC: 4.23 MIL/uL (ref 3.87–5.11)
RDW: 14.1 % (ref 11.5–15.5)
WBC: 11.1 10*3/uL — ABNORMAL HIGH (ref 4.0–10.5)

## 2017-08-24 MED ORDER — ARFORMOTEROL TARTRATE 15 MCG/2ML IN NEBU
15.0000 ug | INHALATION_SOLUTION | Freq: Two times a day (BID) | RESPIRATORY_TRACT | Status: DC
  Administered 2017-08-24 – 2017-08-25 (×2): 15 ug via RESPIRATORY_TRACT
  Filled 2017-08-24 (×2): qty 2

## 2017-08-24 MED ORDER — ACETAMINOPHEN 650 MG RE SUPP: 650.0000 mg | Freq: Four times a day (QID) | RECTAL | Status: DC | PRN

## 2017-08-24 MED ORDER — AMIODARONE HCL 100 MG PO TABS
100.0000 mg | ORAL_TABLET | Freq: Every day | ORAL | Status: DC
  Administered 2017-08-24 – 2017-08-25 (×2): 100 mg via ORAL
  Filled 2017-08-24 (×3): qty 1

## 2017-08-24 MED ORDER — ATORVASTATIN CALCIUM 10 MG PO TABS
10.0000 mg | ORAL_TABLET | Freq: Every day | ORAL | Status: DC
  Administered 2017-08-24: 10 mg via ORAL
  Filled 2017-08-24: qty 1

## 2017-08-24 MED ORDER — ANASTROZOLE 1 MG PO TABS
1.0000 mg | ORAL_TABLET | Freq: Every day | ORAL | Status: DC
  Administered 2017-08-24 – 2017-08-25 (×2): 1 mg via ORAL
  Filled 2017-08-24 (×2): qty 1

## 2017-08-24 MED ORDER — ACETAMINOPHEN 325 MG PO TABS
650.0000 mg | ORAL_TABLET | Freq: Four times a day (QID) | ORAL | Status: DC | PRN
  Administered 2017-08-24 – 2017-08-25 (×3): 650 mg via ORAL
  Filled 2017-08-24 (×3): qty 2

## 2017-08-24 MED ORDER — ONDANSETRON HCL 4 MG/2ML IJ SOLN: 4.0000 mg | Freq: Four times a day (QID) | INTRAMUSCULAR | Status: DC | PRN

## 2017-08-24 MED ORDER — SPIRONOLACTONE 25 MG PO TABS
25.0000 mg | ORAL_TABLET | Freq: Every day | ORAL | Status: DC
  Administered 2017-08-24 – 2017-08-25 (×2): 25 mg via ORAL
  Filled 2017-08-24 (×2): qty 1

## 2017-08-24 MED ORDER — LOPERAMIDE HCL 2 MG PO CAPS
2.0000 mg | ORAL_CAPSULE | Freq: Once | ORAL | Status: AC
  Administered 2017-08-24: 2 mg via ORAL
  Filled 2017-08-24: qty 1

## 2017-08-24 MED ORDER — FUROSEMIDE 10 MG/ML IJ SOLN
20.0000 mg | Freq: Two times a day (BID) | INTRAMUSCULAR | Status: DC
Start: 2017-08-24 — End: 2017-08-25
  Administered 2017-08-24 – 2017-08-25 (×2): 20 mg via INTRAVENOUS
  Filled 2017-08-24 (×2): qty 2

## 2017-08-24 MED ORDER — SERTRALINE HCL 50 MG PO TABS
25.0000 mg | ORAL_TABLET | Freq: Every day | ORAL | Status: DC
  Administered 2017-08-24 – 2017-08-25 (×2): 25 mg via ORAL
  Filled 2017-08-24 (×2): qty 1

## 2017-08-24 MED ORDER — BUDESONIDE 0.5 MG/2ML IN SUSP
0.5000 mg | Freq: Two times a day (BID) | RESPIRATORY_TRACT | Status: DC
  Administered 2017-08-24 – 2017-08-25 (×2): 0.5 mg via RESPIRATORY_TRACT
  Filled 2017-08-24 (×2): qty 2

## 2017-08-24 MED ORDER — ONDANSETRON HCL 4 MG PO TABS: 4.0000 mg | ORAL_TABLET | Freq: Four times a day (QID) | ORAL | Status: DC | PRN

## 2017-08-24 MED ORDER — LEVOTHYROXINE SODIUM 125 MCG PO TABS
125.0000 ug | ORAL_TABLET | Freq: Every day | ORAL | Status: DC
  Administered 2017-08-25: 125 ug via ORAL
  Filled 2017-08-24: qty 1

## 2017-08-24 MED ORDER — MAGNESIUM OXIDE 400 (241.3 MG) MG PO TABS
400.0000 mg | ORAL_TABLET | Freq: Two times a day (BID) | ORAL | Status: DC
  Administered 2017-08-24 – 2017-08-25 (×2): 400 mg via ORAL
  Filled 2017-08-24 (×2): qty 1

## 2017-08-24 MED ORDER — ALBUTEROL SULFATE (2.5 MG/3ML) 0.083% IN NEBU
5.0000 mg | INHALATION_SOLUTION | Freq: Once | RESPIRATORY_TRACT | Status: AC
  Administered 2017-08-24: 5 mg via RESPIRATORY_TRACT
  Filled 2017-08-24: qty 6

## 2017-08-24 MED ORDER — SODIUM CHLORIDE 0.9 % IV SOLN: 250.0000 mL | INTRAVENOUS | Status: DC | PRN

## 2017-08-24 MED ORDER — TIOTROPIUM BROMIDE MONOHYDRATE 18 MCG IN CAPS
18.0000 ug | ORAL_CAPSULE | Freq: Every day | RESPIRATORY_TRACT | Status: DC
  Administered 2017-08-25: 18 ug via RESPIRATORY_TRACT
  Filled 2017-08-24: qty 5

## 2017-08-24 MED ORDER — SENNOSIDES-DOCUSATE SODIUM 8.6-50 MG PO TABS: 1.0000 | ORAL_TABLET | Freq: Every evening | ORAL | Status: DC | PRN

## 2017-08-24 MED ORDER — LOSARTAN POTASSIUM 50 MG PO TABS
100.0000 mg | ORAL_TABLET | Freq: Every day | ORAL | Status: DC
  Administered 2017-08-24 – 2017-08-25 (×2): 100 mg via ORAL
  Filled 2017-08-24 (×2): qty 2

## 2017-08-24 MED ORDER — AMLODIPINE BESYLATE 5 MG PO TABS
5.0000 mg | ORAL_TABLET | Freq: Every day | ORAL | Status: DC
  Administered 2017-08-24 – 2017-08-25 (×2): 5 mg via ORAL
  Filled 2017-08-24 (×2): qty 1

## 2017-08-24 MED ORDER — SODIUM CHLORIDE 0.9% FLUSH: 3.0000 mL | INTRAVENOUS | Status: DC | PRN

## 2017-08-24 MED ORDER — IBUPROFEN 200 MG PO TABS
400.0000 mg | ORAL_TABLET | Freq: Once | ORAL | Status: AC
  Administered 2017-08-24: 400 mg via ORAL
  Filled 2017-08-24: qty 2

## 2017-08-24 MED ORDER — SODIUM CHLORIDE 0.9% FLUSH
3.0000 mL | Freq: Two times a day (BID) | INTRAVENOUS | Status: DC
  Administered 2017-08-24 – 2017-08-25 (×3): 3 mL via INTRAVENOUS

## 2017-08-24 NOTE — ED Provider Notes (Signed)
[facility] DEPT Provider Note   CSN: [id number] Arrival date & time: 08/24/17  1120     History   Chief Complaint Chief Complaint  Patient presents with  . Shortness of Breath    HPI [name] is a 83 y.o. female.  HPI   She presents for evaluation of shortness of breath with dyspnea on exertion, for several days.  Recently evaluated for same by her pulmonologist, had outpatient evaluation, initially with chest x-ray, and CT scan of the chest.  He recommended admission, 2 days ago when seen, but she declined.  He started oxygen yesterday at home.  She is using the oxygen without significant relief and feels worse.  She is taking her usual medicines including albuterol inhaler without relief.  She denies cough, fever, chills, nausea, vomiting, chest pain, back pain, focal weakness or paresthesia.  There are no other known modifying factors.  Past Medical History:  Diagnosis Date  . A-fib ([place])    takes Amiodarone and Xarelto daily  . Allergy history unknown   . Anxiety    takes Ativan daily as needed  . Aortic stenosis    mild 02/2015 (Dr. Terrence Dupont)  . Arthritis   . Breast cancer ([place])   . Breast cancer of lower-inner quadrant of left female breast ([place]) 02/09/2015  . Bruises easily   . COPD (chronic obstructive pulmonary disease) ([facility])    ALdactone and Spirva daily.Dulera daily.Albuterol daily as needed  . COPD, severe   . Depression   . Emphysema (subcutaneous) (surgical) resulting from a procedure   . Fibromyalgia   . Fibromyalgia   . History of bronchitis 2-3 yrs ago  . History of colon polyps    benign  . Hyperlipidemia    takes Atorvastatin daily  . Hypertension    takes Losartan daily  . Hypothyroidism    takes Synthroid daily  . IBS (irritable bowel syndrome)   . Joint pain   . Osteoarthritis   . Peripheral edema    takes Furosemide and Spirolactone daily  . Peripheral neuropathy   . Pneumonia    hx of-9 yrs ago  .  Radiation 05/05/15-07/07/15   left breast 50.4 Gy, left breast boost 14 Gy, left axilla/SCF 50.4 Gy  . Radiation 05/05/15-07/07/15   left breast 50.4 Gy boosted to 14 gy  . Shortness of breath dyspnea    with exertion  . Sinus problem   . Stroke [facility])    affected left leg    Patient Active Problem List   Diagnosis Date Noted  . Aortic stenosis 04/12/2016  . Multiple lung nodules on CT 11/23/2015  . Breast cancer of lower-inner quadrant of left female breast ([place]) 02/09/2015  . CVA (cerebrovascular accident) ([place]) 03/25/2012  . A-fib ([place]) 03/25/2012  . Bradycardia 08/02/2010  . DYSPNEA 12/01/2009  . HYPOTHYROIDISM 11/01/2009  . HYPERLIPIDEMIA 11/01/2009  . DEPRESSION 11/01/2009  . COPD mixed type ([place]) 11/01/2009  . OSTEOARTHRITIS 11/01/2009  . HYPERTENSION 10/27/2009    Past Surgical History:  Procedure Laterality Date  . APPENDECTOMY    . blephorplasty Bilateral   . BREAST LUMPECTOMY    . BREAST LUMPECTOMY WITH RADIOACTIVE SEED AND AXILLARY LYMPH NODE DISSECTION Left 03/22/2015   Procedure: BREAST LUMPECTOMY WITH RADIOACTIVE SEED AND AXILLARY LYMPH NODE DISSECTION;  Surgeon: Alphonsa Overall, MD;  Location: [facility];  Service: General;  Laterality: Left;  Left axilla and Lyph node excision  . cataract surgery Bilateral   . CHOLECYSTECTOMY    . COLONOSCOPY    .  HEMORRHOID SURGERY    . TONSILLECTOMY    . TUBAL LIGATION    . VIDEO BRONCHOSCOPY WITH ENDOBRONCHIAL NAVIGATION N/A 12/07/2015   Procedure: VIDEO BRONCHOSCOPY WITH ENDOBRONCHIAL NAVIGATION;  Surgeon: Collene Gobble, MD;  Location: [facility];  Service: Thoracic;  Laterality: N/A;     OB History   None      Home Medications    Prior to Admission medications   Medication Sig Start Date End Date Taking? Authorizing Provider  acetaminophen (TYLENOL) 325 MG tablet Take 650 mg by mouth every 4 (four) hours as needed (FOR PAIN.). Reported on 04/20/2015   Yes [provider]  albuterol (PROVENTIL HFA;VENTOLIN HFA) 108 (90  Base) MCG/ACT inhaler Inhale 2 puffs into the lungs every 6 (six) hours as needed for wheezing or shortness of breath.   Yes [provider]  amiodarone (PACERONE) 200 MG tablet Take 100 mg by mouth daily.  07/18/10  Yes [provider]  amLODipine (NORVASC) 5 MG tablet Take 5 mg by mouth daily. 11/24/15  Yes [provider]  anastrozole (ARIMIDEX) 1 MG tablet Take 1 tablet (1 mg total) by mouth daily. 08/15/17  Yes Nicholas Lose, MD  atorvastatin (LIPITOR) 10 MG tablet Take 10 mg by mouth daily.  08/29/14  Yes [provider]  BIOTIN PO Take 1 tablet by mouth daily.   Yes [provider]  calcium carbonate (OS-CAL) 600 MG TABS tablet Take 600 mg by mouth daily with breakfast. Reported on 08/18/2015   Yes [provider]  folic acid (FOLVITE) 585 MCG tablet Take 400 mcg by mouth daily.   Yes [provider]  furosemide (LASIX) 40 MG tablet Take 40 mg by mouth daily. Reported on 06/28/2015 04/10/11  Yes [provider]  IRON PO Take 1 tablet by mouth daily.   Yes [provider]  levothyroxine (SYNTHROID, LEVOTHROID) 125 MCG tablet Take 1 tablet by mouth daily. 09/27/16  Yes [provider]  LORazepam (ATIVAN) 0.5 MG tablet Take 0.5 mg by mouth 2 (two) times daily as needed for anxiety. 08/19/17  Yes [provider]  losartan (COZAAR) 100 MG tablet Take 100 mg by mouth daily.   Yes [provider]  magnesium oxide (MAG-OX) 400 MG tablet Take 400 mg by mouth 2 (two) times daily.     Yes [provider]  mometasone-formoterol (DULERA) 100-5 MCG/ACT AERO Inhale 2 puffs into the lungs 2 (two) times daily. 03/17/13  Yes Young, Tarri Fuller D, MD  Multiple Vitamin (MULTIVITAMIN) tablet Take 1 tablet by mouth daily.   Yes [provider]  rivaroxaban (XARELTO) 20 MG TABS tablet Take 1 tablet (20 mg total) by mouth daily with supper. Please restart this medication on Friday 12/09/15. 12/07/15  Yes  Collene Gobble, MD  sertraline (ZOLOFT) 25 MG tablet Take 25 mg by mouth daily. 06/13/17  Yes [provider]  spironolactone (ALDACTONE) 25 MG tablet Take 25 mg by mouth daily.  11/27/14  Yes [provider]  tiotropium (SPIRIVA) 18 MCG inhalation capsule Place 1 capsule (18 mcg total) into inhaler and inhale daily. 11/05/12  Yes Young, Tarri Fuller D, MD  vitamin B-12 (CYANOCOBALAMIN) 1000 MCG tablet Take 1,000 mcg by mouth daily. Reported on 08/18/2015   Yes [provider]  digoxin (LANOXIN) 0.125 MG tablet Take 1 tablet by mouth Daily. 07/18/10 04/08/17  [provider]    Family History Family History  Adopted: Yes    Social History Social History   Tobacco Use  .  Smoking status: Former Smoker    Packs/day: 0.50    Last attempt to quit: 02/27/2003    Years since quitting: 14.4  . Smokeless tobacco: Never Used  . Tobacco comment: quit smoking 13 yrs ago  Substance Use Topics  . Alcohol use: Yes    Alcohol/week: 0.0 oz    Comment: occasionally beer  . Drug use: No     Allergies   Codeine; Penicillins; and Silvadene [silver sulfadiazine]   Review of Systems Review of Systems  All other systems reviewed and are negative.    Physical Exam Updated Vital Signs BP 139/80   Pulse 72   Temp 98.2 F (36.8 C) (Oral)   Resp (!) 26   SpO2 96%   Physical Exam  Constitutional: She is oriented to person, place, and time. She appears well-developed. She does not appear ill. She appears distressed.  Elderly, frail, uncomfortable.  HENT:  Head: Normocephalic and atraumatic.  Eyes: Pupils are equal, round, and reactive to light. Conjunctivae and EOM are normal.  Neck: Normal range of motion and phonation normal. Neck supple.  Cardiovascular: Normal rate and regular rhythm.  Pulmonary/Chest: She is in respiratory distress (Increased work of breathing). She exhibits no tenderness.  Decreased air movement left greater than right.  No audible wheezing.    Abdominal: Soft. She exhibits no distension. There is no tenderness. There is no guarding.  Musculoskeletal: Normal range of motion. She exhibits no edema or deformity.  Neurological: She is alert and oriented to person, place, and time. She exhibits normal muscle tone.  Skin: Skin is warm and dry.  Psychiatric: She has a normal mood and affect. Her behavior is normal. Judgment and thought content normal.  Nursing note and vitals reviewed.    ED Treatments / Results  Labs (all labs ordered are listed, but only abnormal results are displayed) Labs Reviewed  BASIC METABOLIC PANEL - Abnormal; Notable for the following components:      Result Value   Chloride 96 (*)    Glucose, Bld 107 (*)    All other components within normal limits  CBC - Abnormal; Notable for the following components:   WBC 11.1 (*)    MCV 103.1 (*)    MCH 34.5 (*)    All other components within normal limits    EKG EKG Interpretation  Date/Time:  Saturday August 24 2017 11:37:25 EDT Ventricular Rate:  63 PR Interval:    QRS Duration: 85 QT Interval:  405 QTC Calculation: 415 R Axis:   54 Text Interpretation:  Sinus rhythm Nonspecific T abnormalities, lateral leads since last tracing no significant change Confirmed by Daleen Bo [phone]) on 08/24/2017 12:21:04 PM   Radiology Dg Chest 2 View  Result Date: 08/23/2017 CLINICAL DATA:  Increased shortness of breath for the past 2 weeks. EXAM: CHEST - 2 VIEW COMPARISON:  CT chest dated October 10, 2016. Chest x-ray dated December 07, 2015. FINDINGS: The heart size and mediastinal contours are within normal limits. Normal pulmonary vascularity. The lungs remain hyperinflated. New small to moderate left pleural effusion with adjacent left lower lobe opacities. No pneumothorax. No acute osseous abnormality. IMPRESSION: 1. New small to moderate left pleural effusion with adjacent left lower lobe atelectasis versus infiltrate. Electronically Signed   By: Titus Dubin  M.D.   On: 08/23/2017 08:52   Ct Chest W Contrast  Result Date: 08/23/2017 CLINICAL DATA:  Several days of increasing shortness of breath with known enlarging left pleural effusion, history of breast carcinoma and  previous inflammatory lung nodules. EXAM: CT CHEST WITH CONTRAST TECHNIQUE: Multidetector CT imaging of the chest was performed during intravenous contrast administration. CONTRAST:  77mL ISOVUE-300 IOPAMIDOL (ISOVUE-300) INJECTION 61% COMPARISON:  Chest x-ray from the previous day, CT of the chest from 10/10/2016 as well as PET-CT from 11/29/2015. FINDINGS: Cardiovascular: Thoracic aorta demonstrates atherosclerotic calcifications without aneurysmal dilatation or dissection. No cardiac enlargement is seen. Mild coronary calcifications are noted. The pulmonary artery shows no large central pulmonary embolus. Mediastinum/Nodes: Thoracic inlet is within normal limits. There are scattered mediastinal lymph nodes identified. One of these is seen on image number 38 of series 2 measuring 9.6 mm roughly stable from the previous exam. There is however a larger node complex which measures approximately 11 by 2.3 cm in dimensions and was not seen on the prior exam. Some left hilar/AP window nodes are noted which were not seen on the prior exam measuring 12 mm in short axis. Few small left hilar nodes are noted as well. No significant subcarinal or right hilar adenopathy is noted. A stable small right hilar lymph node is noted measuring 10 mm in short axis. The esophagus is within normal limits. Lungs/Pleura: Large right-sided pleural effusion is identified. No definitive enhancement to suggest pleural involvement is seen. Mild emphysematous changes are identified within the right lung the. No sizable infiltrate or nodule is noted within the right lung. Emphysematous changes are also noted in the left lung. There are persistent changes in the medial aspect of the left upper lobe which have increased when compared  with the prior exam consistent with some underlying soft tissue change superimposed over the previously seen apical scarring. This is noticeable in the medial apex where there is soft tissue density measuring at least 2.6 x 1.7 cm best seen on image number 25 of series 2. A focal nodule with central decreased attenuation is noted on image number 29 of series 2 measuring 1.7 x 1.2 cm the previously seen pleural based density anteriorly is again identified and relatively stable at approximately 9 mm. Some more or inferior pleural based density is noted anteriorly measuring 12 mm with some increased spiculation. Additional nodularity is noted measuring 12 mm best seen on image number 62 of series 2 also within the left lower lobe inferiorly. Some smaller pleural based nodules are noted more inferiorly within the lingula. Left lower lobe shows no significant mass lesion. A small stable subpleural nodule is noted best seen on image number 72 of series 3. Upper Abdomen: Gallbladder has been surgically removed. No other focal abnormality in the upper abdomen is noted. Musculoskeletal: Degenerative changes of the thoracic spine are seen. No definitive metastatic disease is seen. No compression deformities are noted. Changes consistent with prior biopsy and resection in the left breast are noted. Some slight increased density in the left breast is noted although may simply be related to postoperative change. No axillary adenopathy is seen. IMPRESSION: Changes in the left upper lobe with increase in both size and number of soft tissue lesions as described with associated large left pleural effusion. Additionally significant increase in mediastinal and left hilar adenopathy is noted. These changes are consistent with underlying neoplastic involvement. Although this may be primary and pulmonary in etiology the possibility of metastatic disease would deserve consideration. PET-CT and subsequent tissue sampling is recommended for  further evaluation. Thoracentesis may also be helpful to assess for any cytology. Slight increase in density within the left breast in the area of prior surgery. This is of uncertain  significance. Mammography may be helpful for further evaluation. Aortic Atherosclerosis (ICD10-I70.0) and Emphysema (ICD10-J43.9). These results will be called to the ordering clinician or representative by the Radiologist Assistant, and communication documented in the PACS or zVision Dashboard. Electronically Signed   By: Inez Catalina M.D.   On: 08/23/2017 16:00    Procedures .Critical Care Performed by: Daleen Bo, MD Authorized by: Daleen Bo, MD   Critical care provider statement:    Critical care time (minutes):  35   Critical care start time:  08/24/2017 11:41 AM   Critical care end time:  08/24/2017 2:04 PM   Critical care time was exclusive of:  Separately billable procedures and treating other patients   Critical care was necessary to treat or prevent imminent or life-threatening deterioration of the following conditions:  Respiratory failure   Critical care was time spent personally by me on the following activities:  Blood draw for specimens, development of treatment plan with patient or surrogate, discussions with consultants, evaluation of patient's response to treatment, examination of patient, obtaining history from patient or surrogate, ordering and performing treatments and interventions, ordering and review of laboratory studies, pulse oximetry, re-evaluation of patient's condition, review of old charts and ordering and review of radiographic studies   (including critical care time)  Medications Ordered in ED Medications  albuterol (PROVENTIL) (2.5 MG/3ML) 0.083% nebulizer solution 5 mg (5 mg Nebulization Given 08/24/17 1150)     Initial Impression / Assessment and Plan / ED Course  I have reviewed the triage vital signs and the nursing notes.  Pertinent labs & imaging results that were  available during my care of the patient were reviewed by me and considered in my medical decision making (see chart for details).  Clinical Course as of Aug 25 1402  Sat Aug 24, 2017  1235 Normal except chloride low and glucose high  Basic metabolic panel(!) [EW]  2878 CBC(!) [EW]  1235 Normal except white count high, MCV high  WBC(!): 11.1 [EW]  1341 Persistent pleural effusion, not much worse than yesterday when had CT imaging.  Images reviewed by me  DG Chest 2 View [EW]  6767 Call placed to pulmonary to assist with management, disposition, possible admission.   [EW]  2094 Case discussed with Dr. Debbora Dus, pulmonary critical care, who recommends hospitalist admission, and they will follow as consultants.  He agrees with IR drainage of pleural effusion for assessment of effusion contents.   [EW]    Clinical Course User Index [EW] Daleen Bo, MD     Patient Vitals for the past 24 hrs:  BP Temp Temp src Pulse Resp SpO2  08/24/17 1300 139/80 - - 72 (!) 26 96 %  08/24/17 1126 (!) 159/80 98.2 F (36.8 C) Oral 68 18 94 %    1:42 PM Reevaluation with update and discussion. After initial assessment and treatment, an updated evaluation reveals she is more comfortable with less chest tightness after the nebulizer treatment.  Findings discussed with the patient and all questions were answered. Daleen Bo   Medical Decision Making: Respiratory distress secondary to pleural effusion, improved after treatment with nebulizer and oxygen therapy here in the ED.  Patient with likely new metastatic illness, with large pleural effusion, requiring evaluation and treatment.  She will require admission for expedient management and control of symptoms.  CRITICAL CARE-yes Performed by: Daleen Bo  Nursing Notes Reviewed/ Care Coordinated Applicable Imaging Reviewed Interpretation of Laboratory Data incorporated into ED treatment   2:04 PM-Consult complete with hospitalist.  Patient case  explained and discussed.  He agrees to admit patient for further evaluation and treatment. Call ended at she is 2:20 PM  Plan: Admit  Final Clinical Impressions(s) / ED Diagnoses   Final diagnoses:  Pleural effusion  Lung mass  Breast mass  Hypoxia  Chronic obstructive pulmonary disease, unspecified COPD type [facility])    ED Discharge Orders    None       Daleen Bo, MD 08/24/17 1429

## 2017-08-24 NOTE — ED Triage Notes (Signed)
Per pt chest tightness, SOB, heaviness in arms, unable to take deep breaths, seems more confused today. CT yesterday showed fluid in her lungs. Has hx of breast cancer and COPD.

## 2017-08-24 NOTE — ED Notes (Signed)
ED TO INPATIENT HANDOFF REPORT  Name/Age/Gender [name] 83 y.o. female  Code Status    Code Status Orders  (From admission, onward)        Start     Ordered   08/24/17 1453  Full code  Continuous     08/24/17 1454    Code Status History    Date Active Date Inactive Code Status Order ID Comments User Context   03/22/2015 1623 03/23/2015 1249 Full Code [id number]  Alphonsa Overall, MD Inpatient   03/25/2012 2008 03/27/2012 2230 Full Code [id number]  Joycelyn Man, RN Inpatient    Advance Directive Documentation     Most Recent Value  Type of Advance Directive  Healthcare Power of Attorney, Living will, Out of facility DNR (pink MOST or yellow form)  Pre-existing out of facility DNR order (yellow form or pink MOST form)  Yellow form placed in chart (order not valid for inpatient use), Physician notified to receive inpatient order  "MOST" Form in Place?  -      Home/SNF/Other Home  Chief Complaint shob/fluid on lungs  Level of Care/Admitting Diagnosis ED Disposition    ED Disposition Condition [place]: [place] [[id number]]  Level of Care: Med-Surg [16]  Diagnosis: Pleural effusion [[id number]]  Admitting Physician: Patrecia Pour, EDWIN [[id number]]  Attending Physician: Patrecia Pour, EDWIN [[id number]]  Estimated length of stay: past midnight tomorrow  Certification:: I certify this patient will need inpatient services for at least 2 midnights  PT Class (Do Not Modify): Inpatient [101]  PT Acc Code (Do Not Modify): Private [1]       Medical History Past Medical History:  Diagnosis Date  . A-fib ([place])    takes Amiodarone and Xarelto daily  . Allergy history unknown   . Anxiety    takes Ativan daily as needed  . Aortic stenosis    mild 02/2015 (Dr. Terrence Dupont)  . Arthritis   . Breast cancer ([place])   . Breast cancer of lower-inner quadrant of left female breast ([place]) 02/09/2015  . Bruises easily   . COPD (chronic  obstructive pulmonary disease) ([facility])    ALdactone and Spirva daily.Dulera daily.Albuterol daily as needed  . COPD, severe   . Depression   . Emphysema (subcutaneous) (surgical) resulting from a procedure   . Fibromyalgia   . Fibromyalgia   . History of bronchitis 2-3 yrs ago  . History of colon polyps    benign  . Hyperlipidemia    takes Atorvastatin daily  . Hypertension    takes Losartan daily  . Hypothyroidism    takes Synthroid daily  . IBS (irritable bowel syndrome)   . Joint pain   . Osteoarthritis   . Peripheral edema    takes Furosemide and Spirolactone daily  . Peripheral neuropathy   . Pneumonia    hx of-9 yrs ago  . Radiation 05/05/15-07/07/15   left breast 50.4 Gy, left breast boost 14 Gy, left axilla/SCF 50.4 Gy  . Radiation 05/05/15-07/07/15   left breast 50.4 Gy boosted to 14 gy  . Shortness of breath dyspnea    with exertion  . Sinus problem   . Stroke [facility])    affected left leg    Allergies Allergies  Allergen Reactions  . Codeine Swelling    SWELLING REACTION UNSPECIFIED   . Penicillins Other (See Comments)    UNSPECIFIED REACTION  Has patient had a PCN reaction causing immediate rash, facial/tongue/throat swelling, SOB or lightheadedness with  hypotension:unsure Has patient had a PCN reaction causing severe rash involving mucus membranes or skin necrosis:unsure Has patient had a PCN reaction that required hospitalization:NO Has patient had a PCN reaction occurring within the last 10 years:NO If all of the above answers are "NO", then may proceed with Cephalosporin use.   [name] [Silver Sulfadiazine] Rash    IV Location/Drains/Wounds Patient Lines/Drains/Airways Status   Active Line/Drains/Airways    Name:   Placement date:   Placement time:   Site:   Days:   Peripheral IV 08/24/17 Right Wrist   08/24/17    1150    Wrist   less than 1   Closed System Drain 1 Left;Inferior Breast Bulb (JP) 19 Fr.   03/22/15    1229    Breast   886   Incision  (Closed) 03/22/15 Abdomen Left   03/22/15    1244     886   Incision (Closed) 03/22/15 Axilla Left   03/22/15    -     886   Incision (Closed) Breast Left   -    -        Incision (Closed) 12/07/15 N/A Other (Comment)   12/07/15    0847     626          Labs/Imaging Results for orders placed or performed during the hospital encounter of 08/24/17 (from the past 48 hour(s))  Basic metabolic panel     Status: Abnormal   Collection Time: 08/24/17 11:35 AM  Result Value Ref Range   Sodium 136 135 - 145 mmol/L   Potassium 3.8 3.5 - 5.1 mmol/L   Chloride 96 (L) 98 - 111 mmol/L    Comment: Please note change in reference range.   CO2 28 22 - 32 mmol/L   Glucose, Bld 107 (H) 70 - 99 mg/dL    Comment: Please note change in reference range.   BUN 20 8 - 23 mg/dL    Comment: Please note change in reference range.   Creatinine, Ser 0.83 0.44 - 1.00 mg/dL   Calcium 9.0 8.9 - 10.3 mg/dL   GFR calc non Af Amer >60 >60 mL/min   GFR calc Af Amer >60 >60 mL/min    Comment: (NOTE) The eGFR has been calculated using the CKD EPI equation. This calculation has not been validated in all clinical situations. eGFR's persistently <60 mL/min signify possible Chronic Kidney Disease.    Anion gap 12 5 - 15    Comment: Performed at [facility], [place] [address] [place], [place] [address]  CBC     Status: Abnormal   Collection Time: 08/24/17 11:35 AM  Result Value Ref Range   WBC 11.1 (H) 4.0 - 10.5 K/uL   RBC 4.23 3.87 - 5.11 MIL/uL   Hemoglobin 14.6 12.0 - 15.0 g/dL   HCT 43.6 36.0 - 46.0 %   MCV 103.1 (H) 78.0 - 100.0 fL   MCH 34.5 (H) 26.0 - 34.0 pg   MCHC 33.5 30.0 - 36.0 g/dL   RDW 14.1 11.5 - 15.5 %   Platelets 378 150 - 400 K/uL    Comment: Performed at [facility], [place] [address] [place], [place] [address]   Dg Chest 2 View  Result Date: 08/24/2017 CLINICAL DATA:  Chest tightness and shortness of breath. EXAM: CHEST - 2 VIEW COMPARISON:  Chest CT  August 23, 2017.  Chest x-ray August 22, 2017. FINDINGS: The left-sided pleural effusion and underlying atelectasis or infiltrate  is similar in the interval. The soft tissue nodularity in the medial right upper lobe was better seen on recent CT imaging but presents as some suprahilar fullness on today's study. No pneumothorax. No other changes. IMPRESSION: The left-sided pleural effusion with underlying opacity is similar in the interval. Known nodularity in the medial left upper lobe with fullness in the left suprahilar region. This was better assessed on yesterday's CT scan. Electronically Signed   By: Dorise Bullion III M.D   On: 08/24/2017 13:00   Dg Chest 2 View  Result Date: 08/23/2017 CLINICAL DATA:  Increased shortness of breath for the past 2 weeks. EXAM: CHEST - 2 VIEW COMPARISON:  CT chest dated October 10, 2016. Chest x-ray dated December 07, 2015. FINDINGS: The heart size and mediastinal contours are within normal limits. Normal pulmonary vascularity. The lungs remain hyperinflated. New small to moderate left pleural effusion with adjacent left lower lobe opacities. No pneumothorax. No acute osseous abnormality. IMPRESSION: 1. New small to moderate left pleural effusion with adjacent left lower lobe atelectasis versus infiltrate. Electronically Signed   By: Titus Dubin M.D.   On: 08/23/2017 08:52   Ct Chest W Contrast  Result Date: 08/23/2017 CLINICAL DATA:  Several days of increasing shortness of breath with known enlarging left pleural effusion, history of breast carcinoma and previous inflammatory lung nodules. EXAM: CT CHEST WITH CONTRAST TECHNIQUE: Multidetector CT imaging of the chest was performed during intravenous contrast administration. CONTRAST:  69m ISOVUE-300 IOPAMIDOL (ISOVUE-300) INJECTION 61% COMPARISON:  Chest x-ray from the previous day, CT of the chest from 10/10/2016 as well as PET-CT from 11/29/2015. FINDINGS: Cardiovascular: Thoracic aorta demonstrates atherosclerotic  calcifications without aneurysmal dilatation or dissection. No cardiac enlargement is seen. Mild coronary calcifications are noted. The pulmonary artery shows no large central pulmonary embolus. Mediastinum/Nodes: Thoracic inlet is within normal limits. There are scattered mediastinal lymph nodes identified. One of these is seen on image number 38 of series 2 measuring 9.6 mm roughly stable from the previous exam. There is however a larger node complex which measures approximately 11 by 2.3 cm in dimensions and was not seen on the prior exam. Some left hilar/AP window nodes are noted which were not seen on the prior exam measuring 12 mm in short axis. Few small left hilar nodes are noted as well. No significant subcarinal or right hilar adenopathy is noted. A stable small right hilar lymph node is noted measuring 10 mm in short axis. The esophagus is within normal limits. Lungs/Pleura: Large right-sided pleural effusion is identified. No definitive enhancement to suggest pleural involvement is seen. Mild emphysematous changes are identified within the right lung the. No sizable infiltrate or nodule is noted within the right lung. Emphysematous changes are also noted in the left lung. There are persistent changes in the medial aspect of the left upper lobe which have increased when compared with the prior exam consistent with some underlying soft tissue change superimposed over the previously seen apical scarring. This is noticeable in the medial apex where there is soft tissue density measuring at least 2.6 x 1.7 cm best seen on image number 25 of series 2. A focal nodule with central decreased attenuation is noted on image number 29 of series 2 measuring 1.7 x 1.2 cm the previously seen pleural based density anteriorly is again identified and relatively stable at approximately 9 mm. Some more or inferior pleural based density is noted anteriorly measuring 12 mm with some increased spiculation. Additional nodularity  is noted measuring  12 mm best seen on image number 62 of series 2 also within the left lower lobe inferiorly. Some smaller pleural based nodules are noted more inferiorly within the lingula. Left lower lobe shows no significant mass lesion. A small stable subpleural nodule is noted best seen on image number 72 of series 3. Upper Abdomen: Gallbladder has been surgically removed. No other focal abnormality in the upper abdomen is noted. Musculoskeletal: Degenerative changes of the thoracic spine are seen. No definitive metastatic disease is seen. No compression deformities are noted. Changes consistent with prior biopsy and resection in the left breast are noted. Some slight increased density in the left breast is noted although may simply be related to postoperative change. No axillary adenopathy is seen. IMPRESSION: Changes in the left upper lobe with increase in both size and number of soft tissue lesions as described with associated large left pleural effusion. Additionally significant increase in mediastinal and left hilar adenopathy is noted. These changes are consistent with underlying neoplastic involvement. Although this may be primary and pulmonary in etiology the possibility of metastatic disease would deserve consideration. PET-CT and subsequent tissue sampling is recommended for further evaluation. Thoracentesis may also be helpful to assess for any cytology. Slight increase in density within the left breast in the area of prior surgery. This is of uncertain significance. Mammography may be helpful for further evaluation. Aortic Atherosclerosis (ICD10-I70.0) and Emphysema (ICD10-J43.9). These results will be called to the ordering clinician or representative by the Radiologist Assistant, and communication documented in the PACS or zVision Dashboard. Electronically Signed   By: Inez Catalina M.D.   On: 08/23/2017 16:00    Pending Labs Unresulted Labs (From admission, onward)   Start     Ordered    08/25/17 0500  CBC  Tomorrow morning,   R     08/24/17 1454   08/25/17 0500  Comprehensive metabolic panel  Tomorrow morning,   R     08/24/17 1454   08/25/17 0500  Lactate dehydrogenase  Tomorrow morning,   R     08/24/17 1454   08/25/17 0500  TSH  Tomorrow morning,   R     08/24/17 1558   08/25/17 0500  T4, free  Tomorrow morning,   R     08/24/17 1558      Vitals/Pain Today's Vitals   08/24/17 1126 08/24/17 1132 08/24/17 1300 08/24/17 1330  BP: (!) 159/80  139/80 (!) 154/74  Pulse: 68  72 72  Resp: 18  (!) 26 (!) 26  Temp: 98.2 F (36.8 C)     TempSrc: Oral     SpO2: 94%  96% 96%  PainSc:  0-No pain      Isolation Precautions No active isolations  Medications Medications  amiodarone (PACERONE) tablet 100 mg (has no administration in time range)  amLODipine (NORVASC) tablet 5 mg (has no administration in time range)  anastrozole (ARIMIDEX) tablet 1 mg (has no administration in time range)  atorvastatin (LIPITOR) tablet 10 mg (has no administration in time range)  levothyroxine (SYNTHROID, LEVOTHROID) tablet 125 mcg (has no administration in time range)  losartan (COZAAR) tablet 100 mg (has no administration in time range)  magnesium oxide (MAG-OX) tablet 400 mg (has no administration in time range)  sertraline (ZOLOFT) tablet 25 mg (has no administration in time range)  spironolactone (ALDACTONE) tablet 25 mg (has no administration in time range)  tiotropium (SPIRIVA) inhalation capsule 18 mcg (18 mcg Inhalation Not Given 08/24/17 1545)  furosemide (LASIX) injection 20  mg (has no administration in time range)  arformoterol (BROVANA) nebulizer solution 15 mcg (has no administration in time range)  budesonide (PULMICORT) nebulizer solution 0.5 mg (has no administration in time range)  sodium chloride flush (NS) 0.9 % injection 3 mL (has no administration in time range)  0.9 %  sodium chloride infusion (has no administration in time range)  sodium chloride flush (NS) 0.9 %  injection 3 mL (has no administration in time range)  ondansetron (ZOFRAN) tablet 4 mg (has no administration in time range)    Or  ondansetron (ZOFRAN) injection 4 mg (has no administration in time range)  acetaminophen (TYLENOL) tablet 650 mg (has no administration in time range)    Or  acetaminophen (TYLENOL) suppository 650 mg (has no administration in time range)  senna-docusate (Senokot-S) tablet 1 tablet (has no administration in time range)  albuterol (PROVENTIL) (2.5 MG/3ML) 0.083% nebulizer solution 5 mg (5 mg Nebulization Given 08/24/17 1150)    Mobility Steady

## 2017-08-24 NOTE — H&P (Addendum)
History and Physical    [name]:[id number] DOB: 1934-04-17  DOA: 08/24/2017 PCP: [name], MD  Patient coming from: Home   Chief Complaint: SOB   HPI: [name] is a 83 y.o. female with medical history significant of breast CA on remission, lung nodules followed by pulmonology (biopsy benign on 11/2015), severe COPD oxygen dependent, hypertension, A. fib on Xarelto, aortic stenosis, depression and hypothyroidism presented to the ED complaining of acute worsening of shortness of breath.  Patient report having dyspnea for a while, was seen by pulmonologist on 6/26 who ordered a CT scan of the chest and started on home oxygen.  Patient was doing okay until this morning were she wake up with severe shortness of breath, unable to speak in full sentences and unable to work.  Associated symptoms included anxiety and chest tightness.  Denies cough, palpitations, fever or chills.  CT chest performed on 6/28 shows left large pleural effusion, with soft tissue lesions changes with increasing size and number.  Left upper lobe lesions also increasing side.  Patient have significant increasing mediastinal and left hillier adenopathy.  Changes consistent with possible neoplastic process.  ED Course: Labs unremarkable.  O2 saturation 94 on 3 L nasal cannula, hemodynamically stable, chest x-ray with persistent left pleural effusion.  WBC 11.1.  Treated with nebulizer and improved.  [facility] called for admission  Review of Systems:   All ROS reviewed and negative.    Past Medical History:  Diagnosis Date  . A-fib ([place])    takes Amiodarone and Xarelto daily  . Allergy history unknown   . Anxiety    takes Ativan daily as needed  . Aortic stenosis    mild 02/2015 (Dr. Terrence Dupont)  . Arthritis   . Breast cancer ([place])   . Breast cancer of lower-inner quadrant of left female breast ([place]) 02/09/2015  . Bruises easily   . COPD (chronic obstructive pulmonary disease) ([facility])    ALdactone and Spirva  daily.Dulera daily.Albuterol daily as needed  . COPD, severe   . Depression   . Emphysema (subcutaneous) (surgical) resulting from a procedure   . Fibromyalgia   . Fibromyalgia   . History of bronchitis 2-3 yrs ago  . History of colon polyps    benign  . Hyperlipidemia    takes Atorvastatin daily  . Hypertension    takes Losartan daily  . Hypothyroidism    takes Synthroid daily  . IBS (irritable bowel syndrome)   . Joint pain   . Osteoarthritis   . Peripheral edema    takes Furosemide and Spirolactone daily  . Peripheral neuropathy   . Pneumonia    hx of-9 yrs ago  . Radiation 05/05/15-07/07/15   left breast 50.4 Gy, left breast boost 14 Gy, left axilla/SCF 50.4 Gy  . Radiation 05/05/15-07/07/15   left breast 50.4 Gy boosted to 14 gy  . Shortness of breath dyspnea    with exertion  . Sinus problem   . Stroke [facility])    affected left leg    Past Surgical History:  Procedure Laterality Date  . APPENDECTOMY    . blephorplasty Bilateral   . BREAST LUMPECTOMY    . BREAST LUMPECTOMY WITH RADIOACTIVE SEED AND AXILLARY LYMPH NODE DISSECTION Left 03/22/2015   Procedure: BREAST LUMPECTOMY WITH RADIOACTIVE SEED AND AXILLARY LYMPH NODE DISSECTION;  Surgeon: Alphonsa Overall, MD;  Location: [facility];  Service: General;  Laterality: Left;  Left axilla and Lyph node excision  . cataract surgery Bilateral   .  CHOLECYSTECTOMY    . COLONOSCOPY    . HEMORRHOID SURGERY    . TONSILLECTOMY    . TUBAL LIGATION    . VIDEO BRONCHOSCOPY WITH ENDOBRONCHIAL NAVIGATION N/A 12/07/2015   Procedure: VIDEO BRONCHOSCOPY WITH ENDOBRONCHIAL NAVIGATION;  Surgeon: Collene Gobble, MD;  Location: [place] [facility];  Service: Thoracic;  Laterality: N/A;     reports that she quit smoking about 14 years ago. She smoked 0.50 packs per day. She has never used smokeless tobacco. She reports that she drinks alcohol. She reports that she does not use drugs.  Allergies  Allergen Reactions  . Codeine Swelling    SWELLING REACTION  UNSPECIFIED   . Penicillins Other (See Comments)    UNSPECIFIED REACTION  Has patient had a PCN reaction causing immediate rash, facial/tongue/throat swelling, SOB or lightheadedness with hypotension:unsure Has patient had a PCN reaction causing severe rash involving mucus membranes or skin necrosis:unsure Has patient had a PCN reaction that required hospitalization:NO Has patient had a PCN reaction occurring within the last 10 years:NO If all of the above answers are "NO", then may proceed with Cephalosporin use.   [name] [Silver Sulfadiazine] Rash    Family History  Adopted: Yes    Prior to Admission medications   Medication Sig Start Date End Date Taking? Authorizing Provider  acetaminophen (TYLENOL) 325 MG tablet Take 650 mg by mouth every 4 (four) hours as needed (FOR PAIN.). Reported on 04/20/2015   Yes [provider]  albuterol (PROVENTIL HFA;VENTOLIN HFA) 108 (90 Base) MCG/ACT inhaler Inhale 2 puffs into the lungs every 6 (six) hours as needed for wheezing or shortness of breath.   Yes [provider]  amiodarone (PACERONE) 200 MG tablet Take 100 mg by mouth daily.  07/18/10  Yes [provider]  amLODipine (NORVASC) 5 MG tablet Take 5 mg by mouth daily. 11/24/15  Yes [provider]  anastrozole (ARIMIDEX) 1 MG tablet Take 1 tablet (1 mg total) by mouth daily. 08/15/17  Yes Nicholas Lose, MD  atorvastatin (LIPITOR) 10 MG tablet Take 10 mg by mouth daily.  08/29/14  Yes [provider]  BIOTIN PO Take 1 tablet by mouth daily.   Yes [provider]  calcium carbonate (OS-CAL) 600 MG TABS tablet Take 600 mg by mouth daily with breakfast. Reported on 08/18/2015   Yes [provider]  folic acid (FOLVITE) 696 MCG tablet Take 400 mcg by mouth daily.   Yes [provider]  furosemide (LASIX) 40 MG tablet Take 40 mg by mouth daily. Reported on 06/28/2015 04/10/11  Yes [provider]  IRON PO Take 1 tablet by  mouth daily.   Yes [provider]  levothyroxine (SYNTHROID, LEVOTHROID) 125 MCG tablet Take 1 tablet by mouth daily. 09/27/16  Yes [provider]  LORazepam (ATIVAN) 0.5 MG tablet Take 0.5 mg by mouth 2 (two) times daily as needed for anxiety. 08/19/17  Yes [provider]  losartan (COZAAR) 100 MG tablet Take 100 mg by mouth daily.   Yes [provider]  magnesium oxide (MAG-OX) 400 MG tablet Take 400 mg by mouth 2 (two) times daily.     Yes [provider]  mometasone-formoterol (DULERA) 100-5 MCG/ACT AERO Inhale 2 puffs into the lungs 2 (two) times daily. 03/17/13  Yes Young, Tarri Fuller D, MD  Multiple Vitamin (MULTIVITAMIN) tablet Take 1 tablet by mouth daily.   Yes [provider]  rivaroxaban (XARELTO) 20 MG TABS tablet Take 1 tablet (20 mg total) by  mouth daily with supper. Please restart this medication on Friday 12/09/15. 12/07/15  Yes Collene Gobble, MD  sertraline (ZOLOFT) 25 MG tablet Take 25 mg by mouth daily. 06/13/17  Yes [provider]  spironolactone (ALDACTONE) 25 MG tablet Take 25 mg by mouth daily.  11/27/14  Yes [provider]  tiotropium (SPIRIVA) 18 MCG inhalation capsule Place 1 capsule (18 mcg total) into inhaler and inhale daily. 11/05/12  Yes Young, Tarri Fuller D, MD  vitamin B-12 (CYANOCOBALAMIN) 1000 MCG tablet Take 1,000 mcg by mouth daily. Reported on 08/18/2015   Yes [provider]  digoxin (LANOXIN) 0.125 MG tablet Take 1 tablet by mouth Daily. 07/18/10 04/08/17  [provider]    Physical Exam: Vitals:   08/24/17 1126 08/24/17 1300 08/24/17 1330  BP: (!) 159/80 139/80 (!) 154/74  Pulse: 68 72 72  Resp: 18 (!) 26 (!) 26  Temp: 98.2 F (36.8 C)    TempSrc: Oral    SpO2: 94% 96% 96%     Constitutional: NAD, calm, comfortable Eyes: PERRL, lids and conjunctivae normal ENMT: Mucous membranes are moist. Posterior pharynx clear of any exudate or lesions. Neck: normal, supple, no  masses, no thyromegaly Respiratory: Decreased breath sound on the left up to mid field.  Decreased breath sounds right lower base, mild diffuse crackles.  No obvious wheezing.  Normal respiratory effort Cardiovascular: Regular rate and rhythm, + systolic murmur. No extremity edema. 2+ pedal pulses.  Abdomen: no tenderness, no masses palpated. No hepatosplenomegaly. Bowel sounds positive.  Musculoskeletal: no clubbing / cyanosis. No joint deformity upper and lower extremities. Good ROM,  Skin: no rashes, lesions, ulcers. No induration Neurologic: CN 2-12 grossly intact.  Psychiatric: Normal judgment and insight. Alert and oriented x 3. Normal mood.   Labs on Admission: I have personally reviewed following labs and imaging studies  CBC: Recent Labs  Lab 08/24/17 1135  WBC 11.1*  HGB 14.6  HCT 43.6  MCV 103.1*  PLT 850   Basic Metabolic Panel: Recent Labs  Lab 08/22/17 1716 08/24/17 1135  NA 133* 136  K 4.6 3.8  CL 96 96*  CO2 23 28  GLUCOSE 99 107*  BUN 18 20  CREATININE 0.79 0.83  CALCIUM 9.2 9.0   GFR: CrCl cannot be calculated (Unknown ideal weight.). Liver Function Tests: No results for input(s): AST, ALT, ALKPHOS, BILITOT, PROT, ALBUMIN in the last 168 hours. No results for input(s): LIPASE, AMYLASE in the last 168 hours. No results for input(s): AMMONIA in the last 168 hours. Coagulation Profile: No results for input(s): INR, PROTIME in the last 168 hours. Cardiac Enzymes: No results for input(s): CKTOTAL, CKMB, CKMBINDEX, TROPONINI in the last 168 hours. BNP (last 3 results) Recent Labs    08/22/17 1716  PROBNP 60.0   HbA1C: No results for input(s): HGBA1C in the last 72 hours. CBG: No results for input(s): GLUCAP in the last 168 hours. Lipid Profile: No results for input(s): CHOL, HDL, LDLCALC, TRIG, CHOLHDL, LDLDIRECT in the last 72 hours. Thyroid Function Tests: Recent Labs    08/22/17 1716  TSH 7.42*   Anemia Panel: No results for input(s):  VITAMINB12, FOLATE, FERRITIN, TIBC, IRON, RETICCTPCT in the last 72 hours. Urine analysis:    Component Value Date/Time   COLORURINE YELLOW 03/25/2012 [place] 03/25/2012 1507   LABSPEC 1.013 03/25/2012 1507   PHURINE 6.0 03/25/2012 1507   GLUCOSEU NEGATIVE 03/25/2012 1507   HGBUR NEGATIVE 03/25/2012 1507   BILIRUBINUR NEGATIVE 03/25/2012 1507  KETONESUR NEGATIVE 03/25/2012 1507   PROTEINUR NEGATIVE 03/25/2012 1507   UROBILINOGEN 0.2 03/25/2012 1507   NITRITE NEGATIVE 03/25/2012 1507   LEUKOCYTESUR NEGATIVE 03/25/2012 1507   Sepsis Labs: !!!!!!!!!!!!!!!!!!!!!!!!!!!!!!!!!!!!!!!!!!!! @LABRCNTIP (procalcitonin:4,lacticidven:4) )No results found for this or any previous visit (from the past 240 hour(s)).   Radiological Exams on Admission: Dg Chest 2 View  Result Date: 08/24/2017 CLINICAL DATA:  Chest tightness and shortness of breath. EXAM: CHEST - 2 VIEW COMPARISON:  Chest CT August 23, 2017.  Chest x-ray August 22, 2017. FINDINGS: The left-sided pleural effusion and underlying atelectasis or infiltrate is similar in the interval. The soft tissue nodularity in the medial right upper lobe was better seen on recent CT imaging but presents as some suprahilar fullness on today's study. No pneumothorax. No other changes. IMPRESSION: The left-sided pleural effusion with underlying opacity is similar in the interval. Known nodularity in the medial left upper lobe with fullness in the left suprahilar region. This was better assessed on yesterday's CT scan. Electronically Signed   By: Dorise Bullion III M.D   On: 08/24/2017 13:00   Dg Chest 2 View  Result Date: 08/23/2017 CLINICAL DATA:  Increased shortness of breath for the past 2 weeks. EXAM: CHEST - 2 VIEW COMPARISON:  CT chest dated October 10, 2016. Chest x-ray dated December 07, 2015. FINDINGS: The heart size and mediastinal contours are within normal limits. Normal pulmonary vascularity. The lungs remain hyperinflated. New small to  moderate left pleural effusion with adjacent left lower lobe opacities. No pneumothorax. No acute osseous abnormality. IMPRESSION: 1. New small to moderate left pleural effusion with adjacent left lower lobe atelectasis versus infiltrate. Electronically Signed   By: Titus Dubin M.D.   On: 08/23/2017 08:52   Ct Chest W Contrast  Result Date: 08/23/2017 CLINICAL DATA:  Several days of increasing shortness of breath with known enlarging left pleural effusion, history of breast carcinoma and previous inflammatory lung nodules. EXAM: CT CHEST WITH CONTRAST TECHNIQUE: Multidetector CT imaging of the chest was performed during intravenous contrast administration. CONTRAST:  43mL ISOVUE-300 IOPAMIDOL (ISOVUE-300) INJECTION 61% COMPARISON:  Chest x-ray from the previous day, CT of the chest from 10/10/2016 as well as PET-CT from 11/29/2015. FINDINGS: Cardiovascular: Thoracic aorta demonstrates atherosclerotic calcifications without aneurysmal dilatation or dissection. No cardiac enlargement is seen. Mild coronary calcifications are noted. The pulmonary artery shows no large central pulmonary embolus. Mediastinum/Nodes: Thoracic inlet is within normal limits. There are scattered mediastinal lymph nodes identified. One of these is seen on image number 38 of series 2 measuring 9.6 mm roughly stable from the previous exam. There is however a larger node complex which measures approximately 11 by 2.3 cm in dimensions and was not seen on the prior exam. Some left hilar/AP window nodes are noted which were not seen on the prior exam measuring 12 mm in short axis. Few small left hilar nodes are noted as well. No significant subcarinal or right hilar adenopathy is noted. A stable small right hilar lymph node is noted measuring 10 mm in short axis. The esophagus is within normal limits. Lungs/Pleura: Large right-sided pleural effusion is identified. No definitive enhancement to suggest pleural involvement is seen. Mild  emphysematous changes are identified within the right lung the. No sizable infiltrate or nodule is noted within the right lung. Emphysematous changes are also noted in the left lung. There are persistent changes in the medial aspect of the left upper lobe which have increased when compared with the prior exam consistent with some underlying  soft tissue change superimposed over the previously seen apical scarring. This is noticeable in the medial apex where there is soft tissue density measuring at least 2.6 x 1.7 cm best seen on image number 25 of series 2. A focal nodule with central decreased attenuation is noted on image number 29 of series 2 measuring 1.7 x 1.2 cm the previously seen pleural based density anteriorly is again identified and relatively stable at approximately 9 mm. Some more or inferior pleural based density is noted anteriorly measuring 12 mm with some increased spiculation. Additional nodularity is noted measuring 12 mm best seen on image number 62 of series 2 also within the left lower lobe inferiorly. Some smaller pleural based nodules are noted more inferiorly within the lingula. Left lower lobe shows no significant mass lesion. A small stable subpleural nodule is noted best seen on image number 72 of series 3. Upper Abdomen: Gallbladder has been surgically removed. No other focal abnormality in the upper abdomen is noted. Musculoskeletal: Degenerative changes of the thoracic spine are seen. No definitive metastatic disease is seen. No compression deformities are noted. Changes consistent with prior biopsy and resection in the left breast are noted. Some slight increased density in the left breast is noted although may simply be related to postoperative change. No axillary adenopathy is seen. IMPRESSION: Changes in the left upper lobe with increase in both size and number of soft tissue lesions as described with associated large left pleural effusion. Additionally significant increase in  mediastinal and left hilar adenopathy is noted. These changes are consistent with underlying neoplastic involvement. Although this may be primary and pulmonary in etiology the possibility of metastatic disease would deserve consideration. PET-CT and subsequent tissue sampling is recommended for further evaluation. Thoracentesis may also be helpful to assess for any cytology. Slight increase in density within the left breast in the area of prior surgery. This is of uncertain significance. Mammography may be helpful for further evaluation. Aortic Atherosclerosis (ICD10-I70.0) and Emphysema (ICD10-J43.9). These results will be called to the ordering clinician or representative by the Radiologist Assistant, and communication documented in the PACS or zVision Dashboard. Electronically Signed   By: Inez Catalina M.D.   On: 08/23/2017 16:00    EKG: Independently reviewed.  Normal sinus rhythm  Assessment/Plan Left large pleural effusion Unclear etiology at this time, patient has history of lung nodules however previous work-up was negative for malignancy.  Patient with history of breast cancer, will need to repeat Ca work-up.  Will admit to [place], IR consulted for thoracentesis cytology and lab work-up ordered. Hold Xarelto. DDx include CHF pt not taking her home Lasix vs scar tissue vs infectious. Will start on Lasix IV 20 mg twice daily. If cytology negative will need biopsy. Hold abx for now no significant findings for infectious process. Monitor BMP while on Lasix.   Acute on chronic respiratory failure Multifactorial due to pleural effusion and COPD. Treat underlying causes  COPD exacerbation O2 dependent Likely due to pleural effusion, recently started on O2  Will continue nebulizer treatment, start Pulmicort and Brovana  Keep O2 sat > 89%   A fib  HR stable, resume amiodarone  Patient on Xarelto, will hold due to thoracentesis  Continue to monitor   HTN  BP stable, continue home medications    Monitor    Hypothyroidism  Resent TSH 7.42, unclear if medication was adjusted  Will repeat TSH and get Free T4. Resume home dose synthroid and adjust as needed   Hx of  breast Ca  On remission, taking anastrozole, will continue  DVT prophylaxis: SCDs holding Xarelto Code Status: Full code Family Communication: Son and daughter at bedside Disposition Plan: Anticipate discharge to previous home environment.  Consults called: IR Admission status: MedSurg/inpatient   Chipper Oman MD [facility] Pager: Text Page via [email or web address]  [phone]  If 7PM-7AM, please contact night-coverage [email or web address] Password [facility]  08/24/2017, 2:54 PM

## 2017-08-25 ENCOUNTER — Inpatient Hospital Stay (HOSPITAL_COMMUNITY): Payer: Medicare Other

## 2017-08-25 DIAGNOSIS — I10 Essential (primary) hypertension: Secondary | ICD-10-CM

## 2017-08-25 DIAGNOSIS — E039 Hypothyroidism, unspecified: Secondary | ICD-10-CM

## 2017-08-25 DIAGNOSIS — J90 Pleural effusion, not elsewhere classified: Principal | ICD-10-CM

## 2017-08-25 DIAGNOSIS — J449 Chronic obstructive pulmonary disease, unspecified: Secondary | ICD-10-CM

## 2017-08-25 LAB — LACTATE DEHYDROGENASE, PLEURAL OR PERITONEAL FLUID: LD, Fluid: 152 U/L — ABNORMAL HIGH (ref 3–23)

## 2017-08-25 LAB — COMPREHENSIVE METABOLIC PANEL
ALBUMIN: 3.6 g/dL (ref 3.5–5.0)
ALK PHOS: 53 U/L (ref 38–126)
ALT: 19 U/L (ref 0–44)
ANION GAP: 12 (ref 5–15)
AST: 35 U/L (ref 15–41)
BILIRUBIN TOTAL: 0.6 mg/dL (ref 0.3–1.2)
BUN: 24 mg/dL — AB (ref 8–23)
CALCIUM: 9.1 mg/dL (ref 8.9–10.3)
CO2: 32 mmol/L (ref 22–32)
CREATININE: 1.07 mg/dL — AB (ref 0.44–1.00)
Chloride: 91 mmol/L — ABNORMAL LOW (ref 98–111)
GFR calc Af Amer: 55 mL/min — ABNORMAL LOW (ref >60)
GFR calc non Af Amer: 47 mL/min — ABNORMAL LOW (ref >60)
GLUCOSE: 107 mg/dL — AB (ref 70–99)
Potassium: 4.3 mmol/L (ref 3.5–5.1)
Sodium: 135 mmol/L (ref 135–145)
TOTAL PROTEIN: 7 g/dL (ref 6.5–8.1)

## 2017-08-25 LAB — CBC
HEMATOCRIT: 42.1 % (ref 36.0–46.0)
Hemoglobin: 13.3 g/dL (ref 12.0–15.0)
MCH: 32.8 pg (ref 26.0–34.0)
MCHC: 31.6 g/dL (ref 30.0–36.0)
MCV: 103.7 fL — ABNORMAL HIGH (ref 78.0–100.0)
Platelets: 411 10*3/uL — ABNORMAL HIGH (ref 150–400)
RBC: 4.06 MIL/uL (ref 3.87–5.11)
RDW: 14.4 % (ref 11.5–15.5)
WBC: 9.4 10*3/uL (ref 4.0–10.5)

## 2017-08-25 LAB — GLUCOSE, PLEURAL OR PERITONEAL FLUID: GLUCOSE FL: 107 mg/dL

## 2017-08-25 LAB — GRAM STAIN: Gram Stain: NONE SEEN

## 2017-08-25 LAB — T4, FREE: FREE T4: 1.84 ng/dL — AB (ref 0.82–1.77)

## 2017-08-25 LAB — TSH: TSH: 8.778 u[IU]/mL — ABNORMAL HIGH (ref 0.350–4.500)

## 2017-08-25 LAB — LACTATE DEHYDROGENASE: LDH: 169 U/L (ref 98–192)

## 2017-08-25 LAB — AMYLASE, PLEURAL OR PERITONEAL FLUID: Amylase, Fluid: 26 U/L

## 2017-08-25 LAB — PROTEIN, PLEURAL OR PERITONEAL FLUID: TOTAL PROTEIN, FLUID: 4.6 g/dL

## 2017-08-25 MED ORDER — LIDOCAINE HCL 1 % IJ SOLN
INTRAMUSCULAR | Status: AC
  Filled 2017-08-25: qty 20

## 2017-08-25 NOTE — Progress Notes (Signed)
Patient discharged to home, all discharge medications and instructions reviewed and questions answered.  Patient to be assisted to vehicle by wheelchair.  

## 2017-08-25 NOTE — Discharge Summary (Signed)
Physician Discharge Summary  [name]:[id number] DOB: Feb 29, 1934 DOA: 08/24/2017  PCP: Renee Panda, MD  Admit date: 08/24/2017 Discharge date: 08/25/2017  Time spent: 45 minutes  Recommendations for Outpatient Follow-up:  Patient will be discharged to home.  Patient will need to follow up with primary care provider within one week of discharge, discuss thyroid function tests. Follow up with pulmonology for results of thoracentesis.  Patient should continue medications as prescribed.  Patient should follow a heart healthy diet.  Discharge Diagnoses:  Left large pleural effusion Acute on chronic respiratory failure COPD exacerbation on chronic oxygen Atrial fibrillation Essential hypertension Hypothyroidism History of breast cancer  Discharge Condition: Stable  Diet recommendation: heart healthy  Filed Weights   08/24/17 1630  Weight: 62.4 kg (137 lb 9.1 oz)    History of present illness:  On 08/24/2017 by Dr. Chipper [name] [name] is a 83 y.o. female with medical history significant of breast CA on remission, lung nodules followed by pulmonology (biopsy benign on 11/2015), severe COPD oxygen dependent, hypertension, A. fib on Xarelto, aortic stenosis, depression and hypothyroidism presented to the ED complaining of acute worsening of shortness of breath.  Patient report having dyspnea for a while, was seen by pulmonologist on 6/26 who ordered a CT scan of the chest and started on home oxygen.  Patient was doing okay until this morning were she wake up with severe shortness of breath, unable to speak in full sentences and unable to work.  Associated symptoms included anxiety and chest tightness.  Denies cough, palpitations, fever or chills.  CT chest performed on 6/28 shows left large pleural effusion, with soft tissue lesions changes with increasing size and number.  Left upper lobe lesions also increasing side.  Patient have significant increasing mediastinal and left  hillier adenopathy.  Changes consistent with possible neoplastic process.  Hospital Course:  Left large pleural effusion -Unknown etiology, suspect possible malignancy as patient has history of breast cancer -Status post ultrasound-guided thoracentesis yielding 800 cc  -Cytology and labs currently pending and can be followed up on as an outpatient  Acute on chronic respiratory failure -Patient with history of COPD -Improved since thoracentesis  COPD on chronic oxygen -Suspect made worse by pleural effusion -Currently does not appear to be with an exacerbation -Patient was placed on nebulizer treatments -Continue supplemental oxygen upon discharge -Improved status post thoracentesis  -Continue albuterol, spiriva, and dulera on discharge  Atrial fibrillation -Continue amiodarone and Xarelto  Essential hypertension -Continue Cozaar, Aldactone, amlodipine, lasix  Hypothyroidism -Resume Synthroid -TSH 8.778, FT4 1.84 (TSH 7.42 was 6/27)- not sure if her synthroid dose was changed  -Would follow-up with PCP for repeat TSH and T4, if Synthroid needs to be adjusted -patient admit to not taking synthroid correctly and missed a few doses   History of breast cancer -Continue anastrozole  Depression -Continue Zoloft  Procedures: Ultrasound-guided thoracentesis  Consultations: IR  Discharge Exam: Vitals:   08/25/17 1046 08/25/17 1049  BP:    Pulse:    Resp:    Temp:    SpO2: 98% 98%   Patient is feeling better after the thoracentesis and wants to go home. Denies current chest pain, shortness of breath, abdominal pain, N/V/D/C.    General: Well developed, well nourished, NAD, appears stated age  83: NCAT, mucous membranes moist.  Neck: Supple  Cardiovascular: S1 S2 auscultated, RRR, no murmurs  Respiratory: Clear to auscultation bilaterally with equal chest rise  Abdomen: Soft, nontender, nondistended, + bowel sounds  Extremities: warm dry without cyanosis  clubbing or edema  Neuro: AAOx3, Nonfocal   Psych: Normal affect and demeanor with intact judgement and insight  Discharge Instructions Discharge Instructions    Discharge instructions   Complete by:  As directed    Patient will be discharged to home.  Patient will need to follow up with primary care provider within one week of discharge, discuss thyroid function tests. Follow up with pulmonology for results of thoracentesis.  Patient should continue medications as prescribed.  Patient should follow a heart healthy diet.     Allergies as of 08/25/2017      Reactions   Codeine Swelling   SWELLING REACTION UNSPECIFIED   Penicillins Other (See Comments)   UNSPECIFIED REACTION  Has patient had a PCN reaction causing immediate rash, facial/tongue/throat swelling, SOB or lightheadedness with hypotension:unsure Has patient had a PCN reaction causing severe rash involving mucus membranes or skin necrosis:unsure Has patient had a PCN reaction that required hospitalization:NO Has patient had a PCN reaction occurring within the last 10 years:NO If all of the above answers are "NO", then may proceed with Cephalosporin use.   Silvadene [silver Sulfadiazine] Rash      Medication List    TAKE these medications   acetaminophen 325 MG tablet Commonly known as:  TYLENOL Take 650 mg by mouth every 4 (four) hours as needed (FOR PAIN.). Reported on 04/20/2015   albuterol 108 (90 Base) MCG/ACT inhaler Commonly known as:  PROVENTIL HFA;VENTOLIN HFA Inhale 2 puffs into the lungs every 6 (six) hours as needed for wheezing or shortness of breath.   amiodarone 200 MG tablet Commonly known as:  PACERONE Take 100 mg by mouth daily.   amLODipine 5 MG tablet Commonly known as:  NORVASC Take 5 mg by mouth daily.   anastrozole 1 MG tablet Commonly known as:  ARIMIDEX Take 1 tablet (1 mg total) by mouth daily.   atorvastatin 10 MG tablet Commonly known as:  LIPITOR Take 10 mg by mouth daily.     BIOTIN PO Take 1 tablet by mouth daily.   calcium carbonate 600 MG Tabs tablet Commonly known as:  OS-CAL Take 600 mg by mouth daily with breakfast. Reported on 6/37/8588   folic acid 502 MCG tablet Commonly known as:  FOLVITE Take 400 mcg by mouth daily.   furosemide 40 MG tablet Commonly known as:  LASIX Take 40 mg by mouth daily. Reported on 06/28/2015   IRON PO Take 1 tablet by mouth daily.   levothyroxine 125 MCG tablet Commonly known as:  SYNTHROID, LEVOTHROID Take 1 tablet by mouth daily.   LORazepam 0.5 MG tablet Commonly known as:  ATIVAN Take 0.5 mg by mouth 2 (two) times daily as needed for anxiety.   losartan 100 MG tablet Commonly known as:  COZAAR Take 100 mg by mouth daily.   magnesium oxide 400 MG tablet Commonly known as:  MAG-OX Take 400 mg by mouth 2 (two) times daily.   mometasone-formoterol 100-5 MCG/ACT Aero Commonly known as:  DULERA Inhale 2 puffs into the lungs 2 (two) times daily.   multivitamin tablet Take 1 tablet by mouth daily.   rivaroxaban 20 MG Tabs tablet Commonly known as:  XARELTO Take 1 tablet (20 mg total) by mouth daily with supper. Please restart this medication on Friday 12/09/15.   sertraline 25 MG tablet Commonly known as:  ZOLOFT Take 25 mg by mouth daily.   spironolactone 25 MG tablet Commonly known as:  ALDACTONE Take 25 mg  by mouth daily.   tiotropium 18 MCG inhalation capsule Commonly known as:  SPIRIVA Place 1 capsule (18 mcg total) into inhaler and inhale daily.   vitamin B-12 1000 MCG tablet Commonly known as:  CYANOCOBALAMIN Take 1,000 mcg by mouth daily. Reported on 08/18/2015      Allergies  Allergen Reactions  . Codeine Swelling    SWELLING REACTION UNSPECIFIED   . Penicillins Other (See Comments)    UNSPECIFIED REACTION  Has patient had a PCN reaction causing immediate rash, facial/tongue/throat swelling, SOB or lightheadedness with hypotension:unsure Has patient had a PCN reaction causing  severe rash involving mucus membranes or skin necrosis:unsure Has patient had a PCN reaction that required hospitalization:NO Has patient had a PCN reaction occurring within the last 10 years:NO If all of the above answers are "NO", then may proceed with Cephalosporin use.   [name] [Silver Sulfadiazine] Rash   Follow-up Information    Renee Panda, MD. Schedule an appointment as soon as possible for a visit in 1 week(s).   Specialty:  Internal Medicine Why:  Hospital follow up Contact information: [address] [place] [address] [phone]            The results of significant diagnostics from this hospitalization (including imaging, microbiology, ancillary and laboratory) are listed below for reference.    Significant Diagnostic Studies: Dg Chest 1 View  Result Date: 08/25/2017 CLINICAL DATA:  Status post left thoracentesis EXAM: CHEST  1 VIEW COMPARISON:  August 24, 2017 FINDINGS: The patient is status post left thoracentesis. The left-sided pleural effusion and underlying opacity are much smaller in the interval. No pneumothorax identified. No other changes. IMPRESSION: The left-sided pleural effusion is smaller after thoracentesis. No pneumothorax. No other change. Electronically Signed   By: Dorise Bullion III M.D   On: 08/25/2017 10:14   Dg Chest 2 View  Result Date: 08/24/2017 CLINICAL DATA:  Chest tightness and shortness of breath. EXAM: CHEST - 2 VIEW COMPARISON:  Chest CT August 23, 2017.  Chest x-ray August 22, 2017. FINDINGS: The left-sided pleural effusion and underlying atelectasis or infiltrate is similar in the interval. The soft tissue nodularity in the medial right upper lobe was better seen on recent CT imaging but presents as some suprahilar fullness on today's study. No pneumothorax. No other changes. IMPRESSION: The left-sided pleural effusion with underlying opacity is similar in the interval. Known nodularity in the medial left upper lobe with fullness  in the left suprahilar region. This was better assessed on yesterday's CT scan. Electronically Signed   By: Dorise Bullion III M.D   On: 08/24/2017 13:00   Dg Chest 2 View  Result Date: 08/23/2017 CLINICAL DATA:  Increased shortness of breath for the past 2 weeks. EXAM: CHEST - 2 VIEW COMPARISON:  CT chest dated October 10, 2016. Chest x-ray dated December 07, 2015. FINDINGS: The heart size and mediastinal contours are within normal limits. Normal pulmonary vascularity. The lungs remain hyperinflated. New small to moderate left pleural effusion with adjacent left lower lobe opacities. No pneumothorax. No acute osseous abnormality. IMPRESSION: 1. New small to moderate left pleural effusion with adjacent left lower lobe atelectasis versus infiltrate. Electronically Signed   By: Titus Dubin M.D.   On: 08/23/2017 08:52   Ct Chest W Contrast  Result Date: 08/23/2017 CLINICAL DATA:  Several days of increasing shortness of breath with known enlarging left pleural effusion, history of breast carcinoma and previous inflammatory lung nodules. EXAM: CT CHEST WITH CONTRAST TECHNIQUE: Multidetector CT  imaging of the chest was performed during intravenous contrast administration. CONTRAST:  37mL ISOVUE-300 IOPAMIDOL (ISOVUE-300) INJECTION 61% COMPARISON:  Chest x-ray from the previous day, CT of the chest from 10/10/2016 as well as PET-CT from 11/29/2015. FINDINGS: Cardiovascular: Thoracic aorta demonstrates atherosclerotic calcifications without aneurysmal dilatation or dissection. No cardiac enlargement is seen. Mild coronary calcifications are noted. The pulmonary artery shows no large central pulmonary embolus. Mediastinum/Nodes: Thoracic inlet is within normal limits. There are scattered mediastinal lymph nodes identified. One of these is seen on image number 38 of series 2 measuring 9.6 mm roughly stable from the previous exam. There is however a larger node complex which measures approximately 11 by 2.3 cm in  dimensions and was not seen on the prior exam. Some left hilar/AP window nodes are noted which were not seen on the prior exam measuring 12 mm in short axis. Few small left hilar nodes are noted as well. No significant subcarinal or right hilar adenopathy is noted. A stable small right hilar lymph node is noted measuring 10 mm in short axis. The esophagus is within normal limits. Lungs/Pleura: Large right-sided pleural effusion is identified. No definitive enhancement to suggest pleural involvement is seen. Mild emphysematous changes are identified within the right lung the. No sizable infiltrate or nodule is noted within the right lung. Emphysematous changes are also noted in the left lung. There are persistent changes in the medial aspect of the left upper lobe which have increased when compared with the prior exam consistent with some underlying soft tissue change superimposed over the previously seen apical scarring. This is noticeable in the medial apex where there is soft tissue density measuring at least 2.6 x 1.7 cm best seen on image number 25 of series 2. A focal nodule with central decreased attenuation is noted on image number 29 of series 2 measuring 1.7 x 1.2 cm the previously seen pleural based density anteriorly is again identified and relatively stable at approximately 9 mm. Some more or inferior pleural based density is noted anteriorly measuring 12 mm with some increased spiculation. Additional nodularity is noted measuring 12 mm best seen on image number 62 of series 2 also within the left lower lobe inferiorly. Some smaller pleural based nodules are noted more inferiorly within the lingula. Left lower lobe shows no significant mass lesion. A small stable subpleural nodule is noted best seen on image number 72 of series 3. Upper Abdomen: Gallbladder has been surgically removed. No other focal abnormality in the upper abdomen is noted. Musculoskeletal: Degenerative changes of the thoracic spine  are seen. No definitive metastatic disease is seen. No compression deformities are noted. Changes consistent with prior biopsy and resection in the left breast are noted. Some slight increased density in the left breast is noted although may simply be related to postoperative change. No axillary adenopathy is seen. IMPRESSION: Changes in the left upper lobe with increase in both size and number of soft tissue lesions as described with associated large left pleural effusion. Additionally significant increase in mediastinal and left hilar adenopathy is noted. These changes are consistent with underlying neoplastic involvement. Although this may be primary and pulmonary in etiology the possibility of metastatic disease would deserve consideration. PET-CT and subsequent tissue sampling is recommended for further evaluation. Thoracentesis may also be helpful to assess for any cytology. Slight increase in density within the left breast in the area of prior surgery. This is of uncertain significance. Mammography may be helpful for further evaluation. Aortic Atherosclerosis (ICD10-I70.0) and  Emphysema (ICD10-J43.9). These results will be called to the ordering clinician or representative by the Radiologist Assistant, and communication documented in the PACS or zVision Dashboard. Electronically Signed   By: Inez Catalina M.D.   On: 08/23/2017 16:00   US Thoracentesis Asp Pleural Space W/img Guide  Result Date: 08/25/2017 INDICATION: Patient with left pleural effusion. Request is made for diagnostic and therapeutic thoracentesis. EXAM: ULTRASOUND GUIDED DIAGNOSTIC AND THERAPEUTIC LEFT THORACENTESIS MEDICATIONS: 10 mL 1% lidocaine COMPLICATIONS: None immediate. PROCEDURE: An ultrasound guided thoracentesis was thoroughly discussed with the patient and questions answered. The benefits, risks, alternatives and complications were also discussed. The patient understands and wishes to proceed with the procedure. Written consent  was obtained. Ultrasound was performed to localize and mark an adequate pocket of fluid in the left chest. The area was then prepped and draped in the normal sterile fashion. 1% Lidocaine was used for local anesthesia. Under ultrasound guidance a 6 Fr Safe-T-Centesis catheter was introduced. Thoracentesis was performed. The catheter was removed and a dressing applied. FINDINGS: A total of approximately 800 mL of amber fluid was removed. Samples were sent to the laboratory as requested by the clinical team. IMPRESSION: Successful ultrasound guided diagnostic and therapeutic left thoracentesis yielding 800 mL of pleural fluid. Read by: Brynda Greathouse PA-C Electronically Signed   By: Fidela Salisbury M.D.   On: 08/25/2017 10:22    Microbiology: No results found for this or any previous visit (from the past 240 hour(s)).   Labs: Basic Metabolic Panel: Recent Labs  Lab 08/22/17 1716 08/24/17 1135 08/25/17 0517  NA 133* 136 135  K 4.6 3.8 4.3  CL 96 96* 91*  CO2 23 28 32  GLUCOSE 99 107* 107*  BUN 18 20 24*  CREATININE 0.79 0.83 1.07*  CALCIUM 9.2 9.0 9.1   Liver Function Tests: Recent Labs  Lab 08/25/17 0517  AST 35  ALT 19  ALKPHOS 53  BILITOT 0.6  PROT 7.0  ALBUMIN 3.6   No results for input(s): LIPASE, AMYLASE in the last 168 hours. No results for input(s): AMMONIA in the last 168 hours. CBC: Recent Labs  Lab 08/24/17 1135 08/25/17 0517  WBC 11.1* 9.4  HGB 14.6 13.3  HCT 43.6 42.1  MCV 103.1* 103.7*  PLT 378 411*   Cardiac Enzymes: No results for input(s): CKTOTAL, CKMB, CKMBINDEX, TROPONINI in the last 168 hours. BNP: BNP (last 3 results) No results for input(s): BNP in the last 8760 hours.  ProBNP (last 3 results) Recent Labs    08/22/17 1716  PROBNP 60.0    CBG: No results for input(s): GLUCAP in the last 168 hours.     Signed:  Zakaiya Lares  [facility] 08/25/2017, 11:56 AM

## 2017-08-25 NOTE — Procedures (Signed)
PROCEDURE SUMMARY:  Successful US guided diagnostic and therapeutic left thoracentesis. Yielded 800 mL of amber fluid. Pt tolerated procedure well. No immediate complications.  Specimen was sent for labs. CXR ordered.  Docia Barrier PA-C 08/25/2017 9:35 AM

## 2017-08-26 NOTE — Telephone Encounter (Signed)
Scheduled to see her tomorrow

## 2017-08-27 ENCOUNTER — Ambulatory Visit (INDEPENDENT_AMBULATORY_CARE_PROVIDER_SITE_OTHER)
Admission: RE | Admit: 2017-08-27 | Discharge: 2017-08-27 | Disposition: A | Discharge status: Home / Self Care | Payer: Medicare Other | Source: Ambulatory Visit | Attending: Internal Medicine | Admitting: Internal Medicine

## 2017-08-27 ENCOUNTER — Ambulatory Visit: Payer: Medicare Other | Admitting: Internal Medicine

## 2017-08-27 ENCOUNTER — Encounter: Payer: Self-pay | Admitting: Internal Medicine

## 2017-08-27 VITALS — BP 110/62 | HR 70 | Ht 63.0 in | Wt 134.2 lb

## 2017-08-27 DIAGNOSIS — J449 Chronic obstructive pulmonary disease, unspecified: Secondary | ICD-10-CM

## 2017-08-27 DIAGNOSIS — I4891 Unspecified atrial fibrillation: Secondary | ICD-10-CM | POA: Diagnosis not present

## 2017-08-27 DIAGNOSIS — J90 Pleural effusion, not elsewhere classified: Secondary | ICD-10-CM

## 2017-08-27 NOTE — Progress Notes (Signed)
Patient ID: [name], female    DOB: 24-Dec-1934, 83 y.o.   MRN: [id number]  HPI F former smoker followed for lung nodules, dyspnea, COPD,  Complicated by hypothyroid, depression, HBP, breast Ca female former smoker followed for dyspnea/COPD, bilateral lung nodules complicated by hypothyroid, depression, HBP, A. Fib/Xarelto, Aortic Stenosis,  left body CVA-resolved, Breast CA metastatic CT chest 11/10/2015- new and enlarging nodular opacities in the lungs bilaterally.  Bronchoscopy guided 12/07/15-Dr. Byrum- benign/nondiagnostic on report. All cultures negative CT Super D 12/02/2015-1. Bilateral hypermetabolic pulmonary nodules are concerning for malignancy but cannot exclude infectious process PET scan 11/29/2015- Guided bronchoscopy 12/07/2015/Dr. Byrum- benign CT chest HR 03/26/16- waxing and waning pattern of nodular and mass-like opacities throughout the lungs bilaterally, PFT 10/15/16-moderately severe obstructive airways disease, NR dilator, moderately severe diffusion defect.  FVC 1.78/75%, FEV1 0.91/52%, ratio 0.51, TLC 88%, DLCO 51% ---------------------------------------------------------------------------------- 04/08/17- 83 year old female former smoker followed for dyspnea/COPD, bilateral lung nodules complicated by hypothyroid, depression, HBP, A. Fib/Xarelto, Aortic Stenosis,  left body CVA-resolved, Breast CA  Lmetastatic. Little cough, no phlegm or chest pain.  Using Dulera, Spiriva and occasional albuterol HFA. Oncology and Surgery alternate follow-up after L breast CA/XRT. PFT 10/15/16-moderately severe obstructive airways disease, NR dilator, moderately severe diffusion defect.  FVC 1.78/75%, FEV1 0.91/52%, ratio 0.51, TLC 88%, DLCO 51%  08/27/2017- 83 year old female former smoker followed for dyspnea/COPD, bilateral lung nodules complicated by hypothyroid, depression, HBP, A. Fib/Xarelto, Aortic Stenosis,  left body CVA-resolved, Breast CA  Lmetastatic. -----Follow up based  on CT chest results;recent Thoracentesis on 08-15-17 Continues Arimidex for history breast CA.      Son is with hertoday Dulera 100, Spiriva, albuterol   Xarelto Lasix 40, spironolactone 25 mg daily L thoracentesis through ED visit 6/30: Cytology still pending. Exudate TP 4.6/ 7.0= 0.65 , LDH 152 Says she is breathing some better after thoracentesis, feels weak but not dizzy and appetite is good.  Addendum 08/28/17-  Cytology + undifferentiated CA  L pleural fluid. Patient and Dr Lindi Adie being notified.   Review of Systems-see HPI  + = positive Constitutional:   No weight loss, night sweats,  Fevers, chills, fatigue, lassitude. HEENT:   No headaches,  Difficulty swallowing,  Tooth/dental problems,  Sore throat,                No sneezing, itching, ear ache, nasal congestion, post nasal drip,  CV:  No chest pain,  Orthopnea, PND, swelling in lower extremities, anasarca, dizziness, palpitations GI  No heartburn, indigestion, abdominal pain, nausea, vomiting,  Resp: + Dyspnea on exertion  No excess mucus, no productive cough,  No non-productive cough,  No coughing up of blood.  No change in color of mucus.  No wheezing.    Skin: no rash or lesions. GU:   MS:  No joint pain or swelling.  Psych:  No change in mood or affect. No depression or anxiety.  No memory loss.    Objective:   Physical Exam     O2, wheelchair General- Alert, Oriented, Affect-appropriate, Distress- none acute, + sunglasses Skin- rash-none, lesions- none, excoriation- none Lymphadenopathy- none Head- atraumatic            Eyes- Gross vision intact, PERRLA, conjunctivae clear secretions            Ears- Hearing, canals            Nose- Clear, Septal dev, mucus, polyps, erosion, perforation             Throat- Mallampati  II , mucosa clear , drainage- none, tonsils- atrophic Neck- flexible , trachea midline, no stridor , thyroid nl, carotid no bruit Chest - symmetrical excursion , unlabored           Heart/CV- RRR again  feels regular to palpation , + 1/6 AS murmur , no gallop  , no rub, nl s1 s2                           - JVD- none , edema- none, stasis changes- none, varices- none           Lung- +clear/ distant, unlabored , wheeze- none, cough- none , dullness+ L base, rub- none           Chest wall-  Abd- Br/ Gen/ Rectal- Not done, not indicated Extrem- cyanosis- none, clubbing, none, atrophy- none, strength- nl Neuro- grossly intact to observation

## 2017-08-27 NOTE — Patient Instructions (Addendum)
Keep appointment August 6 with Dr Lindi Adie and with Dr Annamaria Boots Pulmonary August 15  Continue O2 at [facility]  Please call as needed

## 2017-08-28 ENCOUNTER — Telehealth: Payer: Self-pay | Admitting: Internal Medicine

## 2017-08-28 NOTE — Assessment & Plan Note (Signed)
Immuno studies pending may clarify breast vs lung or other primary. She is not significantly symptomatic from effusion right now, so she will be directed to Oncology to guide further management.  Plan- CXR, referral back to Dr Lindi Adie

## 2017-08-28 NOTE — Assessment & Plan Note (Signed)
Inhalers appropriate Plan CXR

## 2017-08-28 NOTE — Telephone Encounter (Signed)
I notified [name] that the pleural fluid cytology does show cancer cells, and that I had sent the information to Dr Lindi Adie so he could guide her.  She was upset, but acknowledged.

## 2017-08-28 NOTE — Assessment & Plan Note (Signed)
VR well controlled at this visit.  Followed by cardiology

## 2017-08-28 NOTE — Telephone Encounter (Signed)
May, Please get her in to see me next week Thanks Jalie Eiland

## 2017-08-30 ENCOUNTER — Telehealth: Payer: Self-pay

## 2017-08-30 LAB — CULTURE, BODY FLUID W GRAM STAIN -BOTTLE: Culture: NO GROWTH

## 2017-08-30 NOTE — Telephone Encounter (Signed)
Per Dr.Gudena, appt set for pt next Monday to discuss cytology results. Pt confirmed time/date for next Monday.

## 2017-09-02 ENCOUNTER — Telehealth: Payer: Self-pay

## 2017-09-02 ENCOUNTER — Inpatient Hospital Stay: Payer: Medicare Other | Attending: Hematology and Oncology | Admitting: Hematology and Oncology

## 2017-09-02 ENCOUNTER — Other Ambulatory Visit: Payer: Self-pay

## 2017-09-02 DIAGNOSIS — Z923 Personal history of irradiation: Secondary | ICD-10-CM | POA: Insufficient documentation

## 2017-09-02 DIAGNOSIS — I10 Essential (primary) hypertension: Secondary | ICD-10-CM | POA: Insufficient documentation

## 2017-09-02 DIAGNOSIS — J449 Chronic obstructive pulmonary disease, unspecified: Secondary | ICD-10-CM | POA: Diagnosis not present

## 2017-09-02 DIAGNOSIS — F329 Major depressive disorder, single episode, unspecified: Secondary | ICD-10-CM | POA: Diagnosis not present

## 2017-09-02 DIAGNOSIS — E785 Hyperlipidemia, unspecified: Secondary | ICD-10-CM | POA: Insufficient documentation

## 2017-09-02 DIAGNOSIS — Z8673 Personal history of transient ischemic attack (TIA), and cerebral infarction without residual deficits: Secondary | ICD-10-CM | POA: Diagnosis not present

## 2017-09-02 DIAGNOSIS — E039 Hypothyroidism, unspecified: Secondary | ICD-10-CM | POA: Insufficient documentation

## 2017-09-02 DIAGNOSIS — Z9981 Dependence on supplemental oxygen: Secondary | ICD-10-CM | POA: Diagnosis not present

## 2017-09-02 DIAGNOSIS — G629 Polyneuropathy, unspecified: Secondary | ICD-10-CM | POA: Diagnosis not present

## 2017-09-02 DIAGNOSIS — M1990 Unspecified osteoarthritis, unspecified site: Secondary | ICD-10-CM | POA: Insufficient documentation

## 2017-09-02 DIAGNOSIS — C3490 Malignant neoplasm of unspecified part of unspecified bronchus or lung: Secondary | ICD-10-CM

## 2017-09-02 DIAGNOSIS — Z79899 Other long term (current) drug therapy: Secondary | ICD-10-CM | POA: Insufficient documentation

## 2017-09-02 DIAGNOSIS — K589 Irritable bowel syndrome without diarrhea: Secondary | ICD-10-CM | POA: Diagnosis not present

## 2017-09-02 DIAGNOSIS — C4441 Basal cell carcinoma of skin of scalp and neck: Secondary | ICD-10-CM | POA: Diagnosis not present

## 2017-09-02 DIAGNOSIS — C50312 Malignant neoplasm of lower-inner quadrant of left female breast: Secondary | ICD-10-CM | POA: Insufficient documentation

## 2017-09-02 DIAGNOSIS — C773 Secondary and unspecified malignant neoplasm of axilla and upper limb lymph nodes: Secondary | ICD-10-CM | POA: Insufficient documentation

## 2017-09-02 DIAGNOSIS — H532 Diplopia: Secondary | ICD-10-CM | POA: Diagnosis not present

## 2017-09-02 DIAGNOSIS — Z170 Estrogen receptor positive status [ER+]: Secondary | ICD-10-CM

## 2017-09-02 DIAGNOSIS — J910 Malignant pleural effusion: Secondary | ICD-10-CM | POA: Insufficient documentation

## 2017-09-02 DIAGNOSIS — R630 Anorexia: Secondary | ICD-10-CM | POA: Diagnosis not present

## 2017-09-02 DIAGNOSIS — Z79811 Long term (current) use of aromatase inhibitors: Secondary | ICD-10-CM | POA: Diagnosis not present

## 2017-09-02 DIAGNOSIS — C3492 Malignant neoplasm of unspecified part of left bronchus or lung: Secondary | ICD-10-CM | POA: Insufficient documentation

## 2017-09-02 DIAGNOSIS — Z87891 Personal history of nicotine dependence: Secondary | ICD-10-CM | POA: Insufficient documentation

## 2017-09-02 DIAGNOSIS — R5383 Other fatigue: Secondary | ICD-10-CM | POA: Insufficient documentation

## 2017-09-02 DIAGNOSIS — M797 Fibromyalgia: Secondary | ICD-10-CM | POA: Diagnosis not present

## 2017-09-02 NOTE — Telephone Encounter (Signed)
Spoke with Suanne Marker from pathology.  Order placed for PDL1 per Dr. Lindi Adie.  Accession number [id number].  Verbal orders given for MRI brain with and without contrast d/t double vision.  Orders placed.

## 2017-09-02 NOTE — Assessment & Plan Note (Signed)
Left lumpectomy 03/22/2015: Invasive and in situ lobular carcinoma grade 2, 6 cm, LVI present, posterior lateral and anterior margins broadly involved, 12/12 LN positive with ECE, ER 95%, PR 60%, HER-2 neg ratio 1.31, Ki-67 15%, T3 N3 (stage 3C) Adjuvant radiation therapy 05/06/2015 - 07/07/2015 Left Breast: 50.4 Gy in 28 fractions Left Breast Boost: 14 Gy in 7 fractions (64.4) Left Axilla/SCF: 50.4 Gy in 28 fractions CT chest 11/10/2015: Increase in the number and size of numerous nodular opacities in the lungs bilaterally largest left lower lobe 13 x 28 mm, right lower lobe 18 x 16 mm, no lymphadenopathy Biopsy lung 12/07/2015: Right lower lobe, right middle lobe, left lower lobe: Benign lung tissue with reactive bronchial epithelium  Hospitalization 08/23/2017: Large left pleural effusion.  Status post a thoracentesis Pathology review: Consistent with poorly differentiated squamous cell carcinoma  Recommendation: 1.  Whole-body PET/CT scan for further evaluation I suspect that the tumor may be coming from the head and neck area 2. patient has bad COPD and somewhat poor performance status.  We can discuss treatment options.  Chemotherapy may need to be performed if she has recurrent symptoms.

## 2017-09-02 NOTE — Progress Notes (Signed)
Patient Care Team: [name], MD as PCP - General (Internal Medicine) Alphonsa Overall, MD as Consulting Physician (General Surgery) Nicholas Lose, MD as Consulting Physician (Hematology and Oncology) Gery Pray, MD as Consulting Physician (Radiation Oncology) Sylvan Cheese, NP as Nurse Practitioner (Hematology and Oncology)  DIAGNOSIS:  Encounter Diagnoses  Name Primary?  . Malignant neoplasm of lower-inner quadrant of left breast in female, estrogen receptor positive ([place])   . Malignant neoplasm of unspecified part of unspecified bronchus or lung ([place])   . Squamous cell lung cancer, left ([place])     SUMMARY OF ONCOLOGIC HISTORY:   Breast cancer of lower-inner quadrant of left female breast ([place])   01/24/2015 Mammogram    Area of palpable concern left breast lower inner quadrant focal skin thickening with architectural distortion, ultrasound showed 8:30: 1.1 x 1.7 x 0.9 cm abnormality, left axilla to pathologic axillary lymph nodes cortical thickening 1.3 cm, 9 mm      02/07/2015 Initial Diagnosis    Left breast biopsy 8:00: IDC with DCIS grade 2-3, ER 95%, PR 60%, Ki-67 15%, HER-2 negative ratio 1.31; left axillary lymph node biopsy positive for metastatic carcinoma with extracapsular extension      03/22/2015 Surgery    Left lumpectomy: Invasive and in situ lobular carcinoma grade 2, 2 cm, LVI present, posterior lateral and anterior margins broadly involved, 12/12 LN positive with ECE, ER 95%, PR 60%, HER-2 neg ratio 1.31, Ki-67 15%, T3 N3 (stage 3C)      03/31/2015 Imaging    CT CAP and bone scan Postsurgical changes suspected residual enhancing soft tissue in the anterior/lateral left breast worrisome for residual tumor , associated left supraclavicular, subpectoral and left axillary LN, small hilar LN, small lung nodules      05/06/2015 - 07/07/2015 Radiation Therapy    Adjuvant radiation therapy with Dr. Sondra Come. Left Breast: 50.4 Gy in 28 fractions, Left Breast  Boost: 14 Gy in 7 fractions (64.4) Left Axilla/SCF: 50.4 Gy in 28 fractions      07/28/2015 -  Anti-estrogen oral therapy    Anastrozole 1 mg daily      11/10/2015 Imaging    Increase in the number and size of numerous nodular opacities in the lungs bilaterally largest left lower lobe 13 x 28 mm, right lower lobe 18 x 16 mm, no lymphadenopathy       12/07/2015 Procedure    Lung biopsy right lower lobe: Benign; right middle lobe biopsy benign, left lower lobe biopsy benign with reactive changes      08/25/2017 Pathology Results    Left pleural fluid: Poorly differentiated squamous cell carcinoma.  Tumor cells are positive for CK 5 and 6, CK 903, p63 and P 16 consistent with poorly differentiated squamous cell carcinoma       CHIEF COMPLIANT: Malignant pleural effusion  INTERVAL HISTORY: [name] is a 83 year old with above-mentioned history of malignant pleural effusion who was admitted to the hospital and underwent thoracentesis.  The cells are positive for poorly differentiated squamous cell carcinoma.  She is here today accompanied by her son to discuss her treatment plan.  Her performance status is poor.  REVIEW OF SYSTEMS:   Constitutional: Denies fevers, chills or abnormal weight loss Eyes: Diplopia and blurring of vision Ears, nose, mouth, throat, and face: Denies mucositis or sore throat Respiratory: Chronic shortness of breath Cardiovascular: Denies palpitation, chest discomfort Gastrointestinal:  Denies nausea, heartburn or change in bowel habits Skin: Denies abnormal skin rashes Lymphatics: Denies new lymphadenopathy or  easy bruising Neurological:Denies numbness, tingling or new weaknesses Behavioral/Psych: Mood is stable, no new changes  Extremities: No lower extremity edema  All other systems were reviewed with the patient and are negative.  I have reviewed the past medical history, past surgical history, social history and family history with the patient and they  are unchanged from previous note.  ALLERGIES:  is allergic to codeine; penicillins; and silvadene [silver sulfadiazine].  MEDICATIONS:  Current Outpatient Medications  Medication Sig Dispense Refill  . acetaminophen (TYLENOL) 325 MG tablet Take 650 mg by mouth every 4 (four) hours as needed (FOR PAIN.). Reported on 04/20/2015    . albuterol (PROVENTIL HFA;VENTOLIN HFA) 108 (90 Base) MCG/ACT inhaler Inhale 2 puffs into the lungs every 6 (six) hours as needed for wheezing or shortness of breath.    [name] amiodarone (PACERONE) 200 MG tablet Take 100 mg by mouth daily.     [name] amLODipine (NORVASC) 5 MG tablet Take 5 mg by mouth daily.  3  . anastrozole (ARIMIDEX) 1 MG tablet Take 1 tablet (1 mg total) by mouth daily. 90 tablet 3  . atorvastatin (LIPITOR) 10 MG tablet Take 10 mg by mouth daily.   2  . BIOTIN PO Take 1 tablet by mouth daily.    . calcium carbonate (OS-CAL) 600 MG TABS tablet Take 600 mg by mouth daily with breakfast. Reported on 08/22/9483    . folic acid (FOLVITE) 462 MCG tablet Take 400 mcg by mouth daily.    . furosemide (LASIX) 40 MG tablet Take 40 mg by mouth daily. Reported on 06/28/2015    . IRON PO Take 1 tablet by mouth daily.    [name] levothyroxine (SYNTHROID, LEVOTHROID) 125 MCG tablet Take 1 tablet by mouth daily.  3  . LORazepam (ATIVAN) 0.5 MG tablet Take 0.5 mg by mouth 2 (two) times daily as needed for anxiety.    [name] losartan (COZAAR) 100 MG tablet Take 100 mg by mouth daily.    . magnesium oxide (MAG-OX) 400 MG tablet Take 400 mg by mouth 2 (two) times daily.      . mometasone-formoterol (DULERA) 100-5 MCG/ACT AERO Inhale 2 puffs into the lungs 2 (two) times daily. 1 Inhaler 6  . Multiple Vitamin (MULTIVITAMIN) tablet Take 1 tablet by mouth daily.    . rivaroxaban (XARELTO) 20 MG TABS tablet Take 1 tablet (20 mg total) by mouth daily with supper. Please restart this medication on Friday 12/09/15. 30 tablet 2  . sertraline (ZOLOFT) 25 MG tablet Take 25 mg by mouth daily.  3  .  spironolactone (ALDACTONE) 25 MG tablet Take 25 mg by mouth daily.   2  . tiotropium (SPIRIVA) 18 MCG inhalation capsule Place 1 capsule (18 mcg total) into inhaler and inhale daily. 10 capsule 0  . vitamin B-12 (CYANOCOBALAMIN) 1000 MCG tablet Take 1,000 mcg by mouth daily. Reported on 08/18/2015     No current facility-administered medications for this visit.     PHYSICAL EXAMINATION: ECOG PERFORMANCE STATUS: 1 - Symptomatic but completely ambulatory  Vitals:   09/02/17 1515  BP: 118/61  Pulse: 65  Resp: 17  Temp: 97.7 F (36.5 C)  SpO2: 97%   Filed Weights   09/02/17 1515  Weight: 136 lb 9.6 oz (62 kg)    GENERAL:alert, no distress and comfortable SKIN: skin color, texture, turgor are normal, no rashes or significant lesions EYES: normal, Conjunctiva are pink and non-injected, sclera clear OROPHARYNX:no exudate, no erythema and lips, buccal mucosa, and tongue normal  NECK: supple, thyroid normal size, non-tender, without nodularity LYMPH:  no palpable lymphadenopathy in the cervical, axillary or inguinal LUNGS: Decreased breath sounds at the bases HEART: regular rate & rhythm and no murmurs and no lower extremity edema ABDOMEN:abdomen soft, non-tender and normal bowel sounds MUSCULOSKELETAL:no cyanosis of digits and no clubbing  NEURO: alert & oriented x 3 with fluent speech, no focal motor/sensory deficits EXTREMITIES: No lower extremity edema   LABORATORY DATA:  I have reviewed the data as listed CMP Latest Ref Rng & Units 08/25/2017 08/24/2017 08/22/2017  Glucose 70 - 99 mg/dL 107(H) 107(H) 99  BUN 8 - 23 mg/dL 24(H) 20 18  Creatinine 0.44 - 1.00 mg/dL 1.07(H) 0.83 0.79  Sodium 135 - 145 mmol/L 135 136 133(L)  Potassium 3.5 - 5.1 mmol/L 4.3 3.8 4.6  Chloride 98 - 111 mmol/L 91(L) 96(L) 96  CO2 22 - 32 mmol/L 32 28 23  Calcium 8.9 - 10.3 mg/dL 9.1 9.0 9.2  Total Protein 6.5 - 8.1 g/dL 7.0 - -  Total Bilirubin 0.3 - 1.2 mg/dL 0.6 - -  Alkaline Phos 38 - 126 U/L 53 -  -  AST 15 - 41 U/L 35 - -  ALT 0 - 44 U/L 19 - -    Lab Results  Component Value Date   WBC 9.4 08/25/2017   HGB 13.3 08/25/2017   HCT 42.1 08/25/2017   MCV 103.7 (H) 08/25/2017   PLT 411 (H) 08/25/2017   NEUTROABS 8.2 (H) 11/10/2015    ASSESSMENT & PLAN:  Breast cancer of lower-inner quadrant of left female breast ([place]) Left lumpectomy 03/22/2015: Invasive and in situ lobular carcinoma grade 2, 6 cm, LVI present, posterior lateral and anterior margins broadly involved, 12/12 LN positive with ECE, ER 95%, PR 60%, HER-2 neg ratio 1.31, Ki-67 15%, T3 N3 (stage 3C) Adjuvant radiation therapy 05/06/2015 - 07/07/2015 Left Breast: 50.4 Gy in 28 fractions Left Breast Boost: 14 Gy in 7 fractions (64.4) Left Axilla/SCF: 50.4 Gy in 28 fractions CT chest 11/10/2015: Increase in the number and size of numerous nodular opacities in the lungs bilaterally largest left lower lobe 13 x 28 mm, right lower lobe 18 x 16 mm, no lymphadenopathy Biopsy lung 12/07/2015: Right lower lobe, right middle lobe, left lower lobe: Benign lung tissue with reactive bronchial epithelium  Poorly differentiated squamous cell lung cancer: Hospitalization 08/23/2017: Large left pleural effusion.  Status post a thoracentesis Pathology review: Consistent with poorly differentiated squamous cell carcinoma  Recommendation: 1.  Whole-body PET/CT scan for further evaluation I suspect that the tumor may be coming from the head and neck area 2. patient has bad COPD and somewhat poor performance status.   3. MRI Brain: To evaluate the cause of diplopia Patient is not in any good shape to tolerate systemic chemotherapy.  However she may be able to tolerate immunotherapy if her PDL 1 is greater than 1%. I provided her with literature on pembrolizumab immunotherapy. Return to clinic after the scans to discuss the results and follow-up.  Patient expressed her wishes that she would like to pursue immunotherapy if it is going to help  her live longer.  Orders Placed This Encounter  Procedures  . NM PET Image Initial (PI) Skull Base To Thigh    Standing Status:   Future    Standing Expiration Date:   09/02/2018    Order Specific Question:   ** REASON FOR EXAM (FREE TEXT)    Answer:   Malignant pleural effusion    Order  Specific Question:   If indicated for the ordered procedure, I authorize the administration of a radiopharmaceutical per Radiology protocol    Answer:   Yes    Order Specific Question:   Preferred imaging location?    Answer:   [facility]    Order Specific Question:   Radiology Contrast Protocol - do NOT remove file path    Answer:   [email or web address]   The patient has a good understanding of the overall plan. she agrees with it. she will call with any problems that may develop before the next visit here.   Harriette Ohara, MD 09/02/17

## 2017-09-05 ENCOUNTER — Ambulatory Visit (HOSPITAL_COMMUNITY)
Admission: RE | Admit: 2017-09-05 | Discharge: 2017-09-05 | Disposition: A | Discharge status: Home / Self Care | Payer: Medicare Other | Source: Ambulatory Visit | Attending: Hematology and Oncology | Admitting: Hematology and Oncology

## 2017-09-05 DIAGNOSIS — H532 Diplopia: Secondary | ICD-10-CM | POA: Diagnosis present

## 2017-09-05 DIAGNOSIS — G319 Degenerative disease of nervous system, unspecified: Secondary | ICD-10-CM | POA: Diagnosis not present

## 2017-09-05 DIAGNOSIS — C3490 Malignant neoplasm of unspecified part of unspecified bronchus or lung: Secondary | ICD-10-CM

## 2017-09-05 MED ORDER — GADOBENATE DIMEGLUMINE 529 MG/ML IV SOLN
15.0000 mL | Freq: Once | INTRAVENOUS | Status: AC | PRN
  Administered 2017-09-05: 12 mL via INTRAVENOUS

## 2017-09-06 ENCOUNTER — Ambulatory Visit (HOSPITAL_COMMUNITY)
Admission: RE | Admit: 2017-09-06 | Discharge: 2017-09-06 | Disposition: A | Discharge status: Home / Self Care | Payer: Medicare Other | Source: Ambulatory Visit | Attending: Hematology and Oncology | Admitting: Hematology and Oncology

## 2017-09-06 DIAGNOSIS — C50819 Malignant neoplasm of overlapping sites of unspecified female breast: Secondary | ICD-10-CM | POA: Diagnosis not present

## 2017-09-06 DIAGNOSIS — Z7901 Long term (current) use of anticoagulants: Secondary | ICD-10-CM | POA: Insufficient documentation

## 2017-09-06 DIAGNOSIS — R918 Other nonspecific abnormal finding of lung field: Secondary | ICD-10-CM | POA: Diagnosis not present

## 2017-09-06 DIAGNOSIS — J90 Pleural effusion, not elsewhere classified: Secondary | ICD-10-CM | POA: Insufficient documentation

## 2017-09-06 DIAGNOSIS — Z79890 Hormone replacement therapy (postmenopausal): Secondary | ICD-10-CM | POA: Diagnosis not present

## 2017-09-06 DIAGNOSIS — Z79899 Other long term (current) drug therapy: Secondary | ICD-10-CM | POA: Insufficient documentation

## 2017-09-06 DIAGNOSIS — C3490 Malignant neoplasm of unspecified part of unspecified bronchus or lung: Secondary | ICD-10-CM | POA: Insufficient documentation

## 2017-09-06 LAB — GLUCOSE, CAPILLARY: Glucose-Capillary: 96 mg/dL (ref 70–99)

## 2017-09-06 MED ORDER — FLUDEOXYGLUCOSE F - 18 (FDG) INJECTION
6.9000 | Freq: Once | INTRAVENOUS | Status: AC | PRN
  Administered 2017-09-06: 6.9 via INTRAVENOUS

## 2017-09-13 ENCOUNTER — Telehealth: Payer: Self-pay

## 2017-09-13 NOTE — Telephone Encounter (Signed)
Returned pt's call regarding recent PET and MRI results. I let pt know that Dr Lindi Adie was not in the office but that the providers here can review the results and someone from our office will call her back.  No other needs per pt at this time. Results given to Renee Bihari NP and she is calling pt back.

## 2017-09-13 NOTE — Telephone Encounter (Signed)
Called patient and reviewed her scan results with her and her son.  Explained that Dr. Lindi Adie is out of town until 7/29 and that no recommendation on treatment change could be made until he returned.  Reviewed that if she develops shortness of breath or pain in her lung that can indicate her pleural effusion is re accumulating and may need to be drained.  She will call us if she needs [place] to see her for her symptoms.    Wilber Bihari, NP

## 2017-09-17 ENCOUNTER — Other Ambulatory Visit: Payer: Self-pay | Admitting: Medical

## 2017-09-17 ENCOUNTER — Encounter: Payer: Medicare Other | Admitting: Medical

## 2017-09-17 ENCOUNTER — Telehealth: Payer: Self-pay

## 2017-09-17 ENCOUNTER — Encounter: Payer: Self-pay | Admitting: *Deleted

## 2017-09-17 ENCOUNTER — Inpatient Hospital Stay (HOSPITAL_BASED_OUTPATIENT_CLINIC_OR_DEPARTMENT_OTHER): Payer: Medicare Other | Admitting: Medical

## 2017-09-17 ENCOUNTER — Ambulatory Visit (HOSPITAL_COMMUNITY)
Admission: RE | Admit: 2017-09-17 | Discharge: 2017-09-17 | Disposition: A | Discharge status: Home / Self Care | Payer: Medicare Other | Source: Ambulatory Visit | Attending: Medical | Admitting: Medical

## 2017-09-17 ENCOUNTER — Encounter: Payer: Self-pay | Admitting: General Practice

## 2017-09-17 ENCOUNTER — Inpatient Hospital Stay: Payer: Medicare Other

## 2017-09-17 VITALS — BP 145/77 | HR 64 | Temp 97.6°F | Resp 18 | Ht 63.0 in | Wt 134.3 lb

## 2017-09-17 DIAGNOSIS — C4441 Basal cell carcinoma of skin of scalp and neck: Secondary | ICD-10-CM

## 2017-09-17 DIAGNOSIS — Z79811 Long term (current) use of aromatase inhibitors: Secondary | ICD-10-CM | POA: Diagnosis not present

## 2017-09-17 DIAGNOSIS — J90 Pleural effusion, not elsewhere classified: Secondary | ICD-10-CM | POA: Diagnosis not present

## 2017-09-17 DIAGNOSIS — M797 Fibromyalgia: Secondary | ICD-10-CM

## 2017-09-17 DIAGNOSIS — H532 Diplopia: Secondary | ICD-10-CM

## 2017-09-17 DIAGNOSIS — J910 Malignant pleural effusion: Secondary | ICD-10-CM

## 2017-09-17 DIAGNOSIS — R918 Other nonspecific abnormal finding of lung field: Secondary | ICD-10-CM

## 2017-09-17 DIAGNOSIS — K589 Irritable bowel syndrome without diarrhea: Secondary | ICD-10-CM

## 2017-09-17 DIAGNOSIS — C3492 Malignant neoplasm of unspecified part of left bronchus or lung: Secondary | ICD-10-CM

## 2017-09-17 DIAGNOSIS — C50312 Malignant neoplasm of lower-inner quadrant of left female breast: Secondary | ICD-10-CM

## 2017-09-17 DIAGNOSIS — F329 Major depressive disorder, single episode, unspecified: Secondary | ICD-10-CM

## 2017-09-17 DIAGNOSIS — R630 Anorexia: Secondary | ICD-10-CM

## 2017-09-17 DIAGNOSIS — Z170 Estrogen receptor positive status [ER+]: Principal | ICD-10-CM

## 2017-09-17 DIAGNOSIS — E785 Hyperlipidemia, unspecified: Secondary | ICD-10-CM

## 2017-09-17 DIAGNOSIS — Z9981 Dependence on supplemental oxygen: Secondary | ICD-10-CM

## 2017-09-17 DIAGNOSIS — E039 Hypothyroidism, unspecified: Secondary | ICD-10-CM

## 2017-09-17 DIAGNOSIS — C773 Secondary and unspecified malignant neoplasm of axilla and upper limb lymph nodes: Secondary | ICD-10-CM | POA: Diagnosis not present

## 2017-09-17 DIAGNOSIS — G629 Polyneuropathy, unspecified: Secondary | ICD-10-CM

## 2017-09-17 DIAGNOSIS — R5383 Other fatigue: Secondary | ICD-10-CM

## 2017-09-17 DIAGNOSIS — F321 Major depressive disorder, single episode, moderate: Secondary | ICD-10-CM

## 2017-09-17 DIAGNOSIS — C50919 Malignant neoplasm of unspecified site of unspecified female breast: Secondary | ICD-10-CM

## 2017-09-17 DIAGNOSIS — M1990 Unspecified osteoarthritis, unspecified site: Secondary | ICD-10-CM

## 2017-09-17 DIAGNOSIS — I10 Essential (primary) hypertension: Secondary | ICD-10-CM

## 2017-09-17 DIAGNOSIS — Z8673 Personal history of transient ischemic attack (TIA), and cerebral infarction without residual deficits: Secondary | ICD-10-CM

## 2017-09-17 DIAGNOSIS — J449 Chronic obstructive pulmonary disease, unspecified: Secondary | ICD-10-CM

## 2017-09-17 DIAGNOSIS — Z923 Personal history of irradiation: Secondary | ICD-10-CM

## 2017-09-17 DIAGNOSIS — Z87891 Personal history of nicotine dependence: Secondary | ICD-10-CM

## 2017-09-17 DIAGNOSIS — Z79899 Other long term (current) drug therapy: Secondary | ICD-10-CM

## 2017-09-17 LAB — CBC WITH DIFFERENTIAL (CANCER CENTER ONLY)
Basophils Absolute: 0.1 10*3/uL (ref 0.0–0.1)
Basophils Relative: 1 %
EOS ABS: 0.1 10*3/uL (ref 0.0–0.5)
Eosinophils Relative: 1 %
HCT: 42 % (ref 34.8–46.6)
Hemoglobin: 13.4 g/dL (ref 11.6–15.9)
LYMPHS ABS: 0.8 10*3/uL — AB (ref 0.9–3.3)
LYMPHS PCT: 8 %
MCH: 33.4 pg (ref 25.1–34.0)
MCHC: 31.9 g/dL (ref 31.5–36.0)
MCV: 104.7 fL — AB (ref 79.5–101.0)
MONOS PCT: 9 %
Monocytes Absolute: 1 10*3/uL — ABNORMAL HIGH (ref 0.1–0.9)
Neutro Abs: 9 10*3/uL — ABNORMAL HIGH (ref 1.5–6.5)
Neutrophils Relative %: 81 %
PLATELETS: 360 10*3/uL (ref 145–400)
RBC: 4.01 MIL/uL (ref 3.70–5.45)
RDW: 13.1 % (ref 11.2–14.5)
WBC Count: 10.8 10*3/uL — ABNORMAL HIGH (ref 3.9–10.3)

## 2017-09-17 LAB — CMP (CANCER CENTER ONLY)
ALBUMIN: 3.6 g/dL (ref 3.5–5.0)
ALT: 13 U/L (ref 0–44)
AST: 25 U/L (ref 15–41)
Alkaline Phosphatase: 60 U/L (ref 38–126)
Anion gap: 13 (ref 5–15)
BUN: 26 mg/dL — ABNORMAL HIGH (ref 8–23)
CHLORIDE: 91 mmol/L — AB (ref 98–111)
CO2: 33 mmol/L — ABNORMAL HIGH (ref 22–32)
CREATININE: 0.85 mg/dL (ref 0.44–1.00)
Calcium: 9.6 mg/dL (ref 8.9–10.3)
GFR, Est AFR Am: >60 mL/min (ref >60)
GLUCOSE: 102 mg/dL — AB (ref 70–99)
Potassium: 3.7 mmol/L (ref 3.5–5.1)
Sodium: 137 mmol/L (ref 135–145)
Total Bilirubin: 0.3 mg/dL (ref 0.3–1.2)
Total Protein: 7.4 g/dL (ref 6.5–8.1)

## 2017-09-17 MED ORDER — MIRTAZAPINE 15 MG PO TABS: 15.0000 mg | ORAL_TABLET | Freq: Every day | ORAL | 2 refills | Status: AC

## 2017-09-17 NOTE — Patient Instructions (Signed)
Thoracentesis, Care After Refer to this sheet in the next few weeks. These instructions provide you with information about caring for yourself after your procedure. Your health care provider may also give you more specific instructions. Your treatment has been planned according to current medical practices, but problems sometimes occur. Call your health care provider if you have any problems or questions after your procedure. What can I expect after the procedure? After your procedure, it is common to have pain at the puncture site. Follow these instructions at home:  Take medicines only as directed by your health care provider.  You may return to your normal diet and normal activities as directed by your health care provider.  Drink enough fluid to keep your urine clear or pale yellow.  Do not take baths, swim, or use a hot tub until your health care provider approves.  Follow your health care provider's instructions about: ? Puncture site care. ? Bandage (dressing) changes and removal.  Check your puncture site every day for signs of infection. Watch for: ? Redness, swelling, or pain. ? Fluid, blood, or pus.  Keep all follow-up visits as directed by your health care provider. This is important. Contact a health care provider if:  You have redness, swelling, or pain at your puncture site.  You have fluid, blood, or pus coming from your puncture site.  You have a fever.  You have chills.  You have nausea or vomiting.  You have trouble breathing.  You develop a worsening cough. Get help right away if:  You have extreme shortness of breath.  You develop chest pain.  You faint or feel light-headed. This information is not intended to replace advice given to you by your health care provider. Make sure you discuss any questions you have with your health care provider. Document Released: 03/05/2014 Document Revised: 10/15/2015 Document Reviewed: 11/24/2013 Elsevier  Interactive Patient Education  [employer].

## 2017-09-17 NOTE — Progress Notes (Signed)
[place] Spiritual Care Note  Referred by Claudette Head for spiritual/emotional support for Ms Borgen and DIL Bonnita Nasuti re feelings of depression, discouragement, and general overwhelm since PET scan. Accompanied Alfredia Client to Ms Outman's appt today, normalizing feelings and introducing Spiritual Care as part of their care team. Deferred time to Abigail Elmore/LCSW to address practical needs/questions; by mutual agreement, I will f/u with Ms Calvillo by phone this week, while Bonnita Nasuti plans to call me for caregiver support. Ms Mattila and I plan to address grief associated with major life changes (health status/mets, new use of oxygen, etc) and anticipatory grief as she processes her mortality and prognosis.    [place], [place], [facility] Pager [phone] Voicemail [phone]

## 2017-09-17 NOTE — Telephone Encounter (Signed)
Received call from pt's daughter in law, Renee Nasuti- we have permission to speak with her . She reports pt has been exhausted and breathing had improved and she has home O2.  However, when she spoke to patient this morning, she reported she was worried about fluid accumulating in lungs again.  Notified Sandi Mealy PA and was able to get appt for 1pm today (lab at 12:30/ Chest xray before on arrival). Daughter in law has contacted pt and will bring her.  Daughter in law verbalized they are concerned about recent PET scan results.  Renee Bihari NP spoke with patient last week regarding results.  Daughter in law reported she has noticed pt has been feeling down recently.  Daughter in law would like to speak to social worker about any options with assistance at home. At present, she reports patient lives alone and family has been discussing possibly patient moving in with one of her sons. Will notify social worker [name] or [name] and spiritual care [name], so they may see pt when she comes today.  No other needs per daughter in law at this time.

## 2017-09-17 NOTE — Progress Notes (Signed)
[place] Work  Holiday representative received referral from Therapist, sports for support and resource information.  CSW met with patient and patients daughter in-law in the exam room to offer support and assess for needs.  Patient currently lives alone and has started to require additional support/assistance at home.  Patients 2 sons and daughter in-law are positive support and provide support at the home when they are available.  CSW, patient, and family discussed home health and home care options.  Patient and family were open and agreeable to a home health assessment.  APP submitted the order for a home health assessment.  CSW also encouraged patient and family to contact insurance provider customer service (phone number on the back of insurance card) to determine if policy support home care or transportation benefits.  Patient/family plan to call and will contact CSW if they have any difficulties.  CSW provided contact information and information on the support team and support services at [facility].  CSW encouraged patient and family to call with questions or concerns.  Johnnye Lana, MSW, LCSW, OSW-C Clinical Social Worker [facility] [phone]

## 2017-09-17 NOTE — Progress Notes (Signed)
Pt presents today with inc fatigue and DOE.  Reports not sleeping well and general feelings of depression without SI.  Daughter states that there is no one to help the pt in the mornings and that the family cannot be with her until 3 pm.  Pt denies CP or N/V/D.  Increase incontinence recently.  Social work and Publishing copy visiting with pt/family today.  PA Lucianne Lei aware.

## 2017-09-18 ENCOUNTER — Ambulatory Visit (HOSPITAL_COMMUNITY)
Admission: RE | Admit: 2017-09-18 | Discharge: 2017-09-18 | Disposition: A | Discharge status: Home / Self Care | Payer: Medicare Other | Source: Ambulatory Visit | Attending: Medical | Admitting: Medical

## 2017-09-18 ENCOUNTER — Ambulatory Visit (HOSPITAL_COMMUNITY)
Admission: RE | Admit: 2017-09-18 | Discharge: 2017-09-18 | Disposition: A | Discharge status: Home / Self Care | Payer: Medicare Other | Source: Ambulatory Visit | Attending: Physician Assistant | Admitting: Physician Assistant

## 2017-09-18 DIAGNOSIS — Z170 Estrogen receptor positive status [ER+]: Secondary | ICD-10-CM | POA: Diagnosis not present

## 2017-09-18 DIAGNOSIS — C50312 Malignant neoplasm of lower-inner quadrant of left female breast: Secondary | ICD-10-CM | POA: Insufficient documentation

## 2017-09-18 DIAGNOSIS — J90 Pleural effusion, not elsewhere classified: Secondary | ICD-10-CM | POA: Diagnosis not present

## 2017-09-18 DIAGNOSIS — C50919 Malignant neoplasm of unspecified site of unspecified female breast: Secondary | ICD-10-CM

## 2017-09-18 MED ORDER — LIDOCAINE HCL 1 % IJ SOLN
INTRAMUSCULAR | Status: AC
  Filled 2017-09-18: qty 10

## 2017-09-18 NOTE — Procedures (Signed)
PROCEDURE SUMMARY:  Successful US guided left thoracentesis. Yielded 950 mL of clear amber fluid. Patient tolerated procedure well. No immediate complications.  Post procedure chest X-ray reveals no pneumothorax  Artyom Stencel S Jermario Kalmar PA-C 09/18/2017 4:06 PM

## 2017-09-19 NOTE — Progress Notes (Signed)
[facility] Progress Note   [name] [id number] [birth year] 83 y.o.  [name] is managed by Dr. Nicholas Lose  Actively treated with chemotherapy/immunotherapy: no  Assessment: Plan:    Pleural effusion, left - Plan: Ambulatory referral to [place], US Thoracentesis Asp Pleural space w/IMG guide, CANCELED: IR THORACENTESIS ASP PLEURAL SPACE W/IMG GUIDE  Malignant neoplasm of lower-inner quadrant of left breast in female, estrogen receptor positive ([place]) - Plan: Ambulatory referral to [place], US Thoracentesis Asp Pleural space w/IMG guide  Metastatic breast cancer ([place]) - Plan: Ambulatory referral to [place], US Thoracentesis Asp Pleural space w/IMG guide  Basal cell carcinoma (BCC) of scalp - Plan: Ambulatory referral to Dermatology  Anorexia - Plan: mirtazapine (REMERON) 15 MG tablet  Current moderate episode of major depressive disorder, unspecified whether recurrent ([place]) - Plan: mirtazapine (REMERON) 15 MG tablet   Recurrent metastatic breast cancer with a history of a left pleural effusion: The patient was referred for a chest x-ray which returned showing a moderate left layering pleural effusion.  The patient has been referred for a thoracentesis for tomorrow.  A referral has also been made to home health per the request of the patient's family.   Basal cell carcinoma of the scalp: The patient has a lesion behind her left ear which she has had for an extended period and has been increasing in size.  The area appears to be a basal cell carcinoma.  She has been referred to dermatology for management.  Anorexia and current moderate major depressive disorder: The patient has been placed on Remeron 15 mg p.o. nightly.  Please see After Visit Summary for patient specific instructions.  Future Appointments  Date Time Provider [place]  10/01/2017  3:00 PM Nicholas Lose, MD [facility] None  10/10/2017  3:00 PM Deneise Lever, MD [facility]  None    Orders Placed This Encounter  Procedures  . US Thoracentesis Asp Pleural space w/IMG guide  . Ambulatory referral to Home Health  . Ambulatory referral to Dermatology       Subjective:   Patient ID:  [name] is a 83 y.o. (DOB 1934-12-19) female.  Chief Complaint:  Chief Complaint  Patient presents with  . Fatigue    HPI [name] is an 83 year old female with a history of a recurrent metastatic breast cancer who is seen by Dr. Lindi Adie.  She has a history of a left pleural effusion and presents to the clinic today with her daughter-in-law and son.  She has been having increasing fatigue and shortness of breath.  She is using home oxygen.  She has status post a recent left thoracentesis.  A chest x-ray was completed today which shows a moderate layering left pleural effusion.  She will be referred to have a thoracentesis tomorrow.  She reports that she is fatigued, does not sleep much, has increasing shortness of breath despite being on home O2, is depressed and has anorexia.  She also has an area behind her left ear which she has had for an extended and for which she has not been seen by another provider.  Medications: I have reviewed the patient's current medications.  Allergies:  Allergies  Allergen Reactions  . Codeine Swelling    SWELLING REACTION UNSPECIFIED   . Penicillins Other (See Comments)    UNSPECIFIED REACTION  Has patient had a PCN reaction causing immediate rash, facial/tongue/throat swelling, SOB or lightheadedness with hypotension:unsure Has patient had a PCN reaction causing  severe rash involving mucus membranes or skin necrosis:unsure Has patient had a PCN reaction that required hospitalization:NO Has patient had a PCN reaction occurring within the last 10 years:NO If all of the above answers are "NO", then may proceed with Cephalosporin use.   [name] [Silver Sulfadiazine] Rash    Past Medical History:  Diagnosis Date  . A-fib ([place])     takes Amiodarone and Xarelto daily  . Allergy history unknown   . Anxiety    takes Ativan daily as needed  . Aortic stenosis    mild 02/2015 (Dr. Terrence Dupont)  . Arthritis   . Breast cancer ([place])   . Breast cancer of lower-inner quadrant of left female breast ([place]) 02/09/2015  . Bruises easily   . COPD (chronic obstructive pulmonary disease) ([facility])    ALdactone and Spirva daily.Dulera daily.Albuterol daily as needed  . COPD, severe   . Depression   . Emphysema (subcutaneous) (surgical) resulting from a procedure   . Fibromyalgia   . Fibromyalgia   . History of bronchitis 2-3 yrs ago  . History of colon polyps    benign  . Hyperlipidemia    takes Atorvastatin daily  . Hypertension    takes Losartan daily  . Hypothyroidism    takes Synthroid daily  . IBS (irritable bowel syndrome)   . Joint pain   . Osteoarthritis   . Peripheral edema    takes Furosemide and Spirolactone daily  . Peripheral neuropathy   . Pneumonia    hx of-9 yrs ago  . Radiation 05/05/15-07/07/15   left breast 50.4 Gy, left breast boost 14 Gy, left axilla/SCF 50.4 Gy  . Radiation 05/05/15-07/07/15   left breast 50.4 Gy boosted to 14 gy  . Shortness of breath dyspnea    with exertion  . Sinus problem   . Stroke [facility])    affected left leg    Past Surgical History:  Procedure Laterality Date  . APPENDECTOMY    . blephorplasty Bilateral   . BREAST LUMPECTOMY    . BREAST LUMPECTOMY WITH RADIOACTIVE SEED AND AXILLARY LYMPH NODE DISSECTION Left 03/22/2015   Procedure: BREAST LUMPECTOMY WITH RADIOACTIVE SEED AND AXILLARY LYMPH NODE DISSECTION;  Surgeon: Alphonsa Overall, MD;  Location: [place];  Service: General;  Laterality: Left;  Left axilla and Lyph node excision  . cataract surgery Bilateral   . CHOLECYSTECTOMY    . COLONOSCOPY    . HEMORRHOID SURGERY    . TONSILLECTOMY    . TUBAL LIGATION    . VIDEO BRONCHOSCOPY WITH ENDOBRONCHIAL NAVIGATION N/A 12/07/2015   Procedure: VIDEO BRONCHOSCOPY WITH ENDOBRONCHIAL  NAVIGATION;  Surgeon: Collene Gobble, MD;  Location: [place];  Service: Thoracic;  Laterality: N/A;    Family History  Adopted: Yes    Social History   Socioeconomic History  . Marital status: Single    Spouse name: Not on file  . Number of children: 3  . Years of education: Not on file  . Highest education level: Not on file  Occupational History  . Occupation: retired  Scientific laboratory technician  . Financial resource strain: Not on file  . Food insecurity:    Worry: Not on file    Inability: Not on file  . Transportation needs:    Medical: Not on file    Non-medical: Not on file  Tobacco Use  . Smoking status: Former Smoker    Packs/day: 0.50    Last attempt to quit: 02/27/2003    Years since quitting: 14.5  .  Smokeless tobacco: Never Used  . Tobacco comment: quit smoking 13 yrs ago  Substance and Sexual Activity  . Alcohol use: Yes    Alcohol/week: 0.0 oz    Comment: occasionally beer  . Drug use: No  . Sexual activity: Not on file  Lifestyle  . Physical activity:    Days per week: Not on file    Minutes per session: Not on file  . Stress: Not on file  Relationships  . Social connections:    Talks on phone: Not on file    Gets together: Not on file    Attends religious service: Not on file    Active member of club or organization: Not on file    Attends meetings of clubs or organizations: Not on file    Relationship status: Not on file  . Intimate partner violence:    Fear of current or ex partner: Not on file    Emotionally abused: Not on file    Physically abused: Not on file    Forced sexual activity: Not on file  Other Topics Concern  . Not on file  Social History Narrative  . Not on file    Past Medical History, Surgical history, Social history, and Family history were reviewed and updated as appropriate.   Please see review of systems for further details on the patient's review from today.   Review of Systems:  Review of Systems  Constitutional: Positive for  activity change, appetite change and fatigue. Negative for chills, diaphoresis and fever.  HENT: Negative for congestion, postnasal drip, rhinorrhea and sore throat.   Respiratory: Positive for shortness of breath. Negative for cough and wheezing.   Cardiovascular: Negative for palpitations.  Gastrointestinal: Negative for constipation, diarrhea, nausea and vomiting.  Neurological: Negative for headaches.  Psychiatric/Behavioral: Positive for dysphoric mood and sleep disturbance.    Objective:   Physical Exam:  BP (!) 145/77 (BP Location: Right Arm, Patient Position: Sitting)   Pulse 64   Temp 97.6 F (36.4 C) (Oral)   Resp 18   Ht 5\' 3"  (1.6 m)   Wt 134 lb 4.8 oz (60.9 kg)   SpO2 99%   BMI 23.79 kg/m  ECOG: 1  Physical Exam  Constitutional: No distress.  The patient is an elderly female who appears to be chronically ill and is receiving O2 via nasal cannula.  HENT:  Head: Normocephalic and atraumatic.    Eyes: Right eye exhibits no discharge. Left eye exhibits no discharge. No scleral icterus.  Cardiovascular: Normal rate, regular rhythm and normal heart sounds. Exam reveals no gallop and no friction rub.  No murmur heard. Pulmonary/Chest: Effort normal. No stridor. No respiratory distress. She has decreased breath sounds in the left middle field and the left lower field. She has no wheezes. She has no rales.      Musculoskeletal: She exhibits no edema.  Neurological: She is alert. Coordination (The patient is ambulating with the use of a wheelchair.) abnormal.  Skin: Skin is warm and dry. She is not diaphoretic. No erythema. No pallor.  Psychiatric:  The patient appears to be depressed.    Lab Review:     Component Value Date/Time   NA 137 09/17/2017 1222   NA 140 11/10/2015 1342   K 3.7 09/17/2017 1222   K 4.7 11/10/2015 1342   CL 91 (L) 09/17/2017 1222   CO2 33 (H) 09/17/2017 1222   CO2 27 11/10/2015 1342   GLUCOSE 102 (H) 09/17/2017 1222   GLUCOSE 93  11/10/2015 1342   BUN 26 (H) 09/17/2017 1222   BUN 27.3 (H) 11/10/2015 1342   CREATININE 0.85 09/17/2017 1222   CREATININE 0.9 11/10/2015 1342   CALCIUM 9.6 09/17/2017 1222   CALCIUM 9.8 11/10/2015 1342   PROT 7.4 09/17/2017 1222   PROT 7.9 11/10/2015 1342   ALBUMIN 3.6 09/17/2017 1222   ALBUMIN 3.5 11/10/2015 1342   AST 25 09/17/2017 1222   AST 14 11/10/2015 1342   ALT 13 09/17/2017 1222   ALT 13 11/10/2015 1342   ALKPHOS 60 09/17/2017 1222   ALKPHOS 73 11/10/2015 1342   BILITOT 0.3 09/17/2017 1222   BILITOT <0.30 11/10/2015 1342   GFRNONAA >60 09/17/2017 1222   GFRAA >60 09/17/2017 1222       Component Value Date/Time   WBC 10.8 (H) 09/17/2017 1222   WBC 9.4 08/25/2017 0517   RBC 4.01 09/17/2017 1222   HGB 13.4 09/17/2017 1222   HGB 12.3 11/10/2015 1342   HCT 42.0 09/17/2017 1222   HCT 39.3 11/10/2015 1342   PLT 360 09/17/2017 1222   PLT 396 11/10/2015 1342   MCV 104.7 (H) 09/17/2017 1222   MCV 93.1 11/10/2015 1342   MCH 33.4 09/17/2017 1222   MCHC 31.9 09/17/2017 1222   RDW 13.1 09/17/2017 1222   RDW 14.5 11/10/2015 1342   LYMPHSABS 0.8 (L) 09/17/2017 1222   LYMPHSABS 1.2 11/10/2015 1342   MONOABS 1.0 (H) 09/17/2017 1222   MONOABS 0.8 11/10/2015 1342   EOSABS 0.1 09/17/2017 1222   EOSABS 0.1 11/10/2015 1342   BASOSABS 0.1 09/17/2017 1222   BASOSABS 0.1 11/10/2015 1342   -------------------------------  Imaging from last 24 hours (if applicable):  Radiology interpretation: Dg Chest 1 View  Result Date: 09/18/2017 CLINICAL DATA:  Status post thoracentesis EXAM: CHEST  1 VIEW COMPARISON:  09/17/2017 FINDINGS: Cardiac shadow is stable. Significant reduction in left pleural effusion is noted with only a small residual effusion identified. No pneumothorax is noted. The right lung is well aerated. IMPRESSION: No pneumothorax following left thoracentesis. Electronically Signed   By: Inez Catalina M.D.   On: 09/18/2017 14:48   Dg Chest 1 View  Result Date:  08/25/2017 CLINICAL DATA:  Status post left thoracentesis EXAM: CHEST  1 VIEW COMPARISON:  August 24, 2017 FINDINGS: The patient is status post left thoracentesis. The left-sided pleural effusion and underlying opacity are much smaller in the interval. No pneumothorax identified. No other changes. IMPRESSION: The left-sided pleural effusion is smaller after thoracentesis. No pneumothorax. No other change. Electronically Signed   By: Dorise Bullion III M.D   On: 08/25/2017 10:14   Dg Chest 2 View  Result Date: 08/27/2017 CLINICAL DATA:  Follow-up left pleural effusion. EXAM: CHEST - 2 VIEW COMPARISON:  Chest x-ray 07/29/2017, 08/24/2017. Chest CT 08/23/2017. FINDINGS: Mediastinum is stable. Heart size is stable. Left suprahilar/perihilar density is again noted. Heart size normal. Left-sided pleural effusion is again noted in unchanged. Biapical pleural thickening consistent scarring. No evidence of pneumothorax. Postsurgical changes left breast. No acute or focal bony abnormality. IMPRESSION: Persistent left suprahilar/perihilar density is again noted. Persistent left-sided pleural effusion. No significant change. Similar findings noted on prior exam. Electronically Signed   By: Marcello Moores  Register   On: 08/27/2017 14:50   Dg Chest 2 View  Result Date: 08/24/2017 CLINICAL DATA:  Chest tightness and shortness of breath. EXAM: CHEST - 2 VIEW COMPARISON:  Chest CT August 23, 2017.  Chest x-ray August 22, 2017. FINDINGS: The left-sided pleural effusion and underlying atelectasis or  infiltrate is similar in the interval. The soft tissue nodularity in the medial right upper lobe was better seen on recent CT imaging but presents as some suprahilar fullness on today's study. No pneumothorax. No other changes. IMPRESSION: The left-sided pleural effusion with underlying opacity is similar in the interval. Known nodularity in the medial left upper lobe with fullness in the left suprahilar region. This was better assessed on  yesterday's CT scan. Electronically Signed   By: Dorise Bullion III M.D   On: 08/24/2017 13:00   Dg Chest 2 View  Result Date: 08/23/2017 CLINICAL DATA:  Increased shortness of breath for the past 2 weeks. EXAM: CHEST - 2 VIEW COMPARISON:  CT chest dated October 10, 2016. Chest x-ray dated December 07, 2015. FINDINGS: The heart size and mediastinal contours are within normal limits. Normal pulmonary vascularity. The lungs remain hyperinflated. New small to moderate left pleural effusion with adjacent left lower lobe opacities. No pneumothorax. No acute osseous abnormality. IMPRESSION: 1. New small to moderate left pleural effusion with adjacent left lower lobe atelectasis versus infiltrate. Electronically Signed   By: Titus Dubin M.D.   On: 08/23/2017 08:52   Ct Chest W Contrast  Result Date: 08/23/2017 CLINICAL DATA:  Several days of increasing shortness of breath with known enlarging left pleural effusion, history of breast carcinoma and previous inflammatory lung nodules. EXAM: CT CHEST WITH CONTRAST TECHNIQUE: Multidetector CT imaging of the chest was performed during intravenous contrast administration. CONTRAST:  5mL ISOVUE-300 IOPAMIDOL (ISOVUE-300) INJECTION 61% COMPARISON:  Chest x-ray from the previous day, CT of the chest from 10/10/2016 as well as PET-CT from 11/29/2015. FINDINGS: Cardiovascular: Thoracic aorta demonstrates atherosclerotic calcifications without aneurysmal dilatation or dissection. No cardiac enlargement is seen. Mild coronary calcifications are noted. The pulmonary artery shows no large central pulmonary embolus. Mediastinum/Nodes: Thoracic inlet is within normal limits. There are scattered mediastinal lymph nodes identified. One of these is seen on image number 38 of series 2 measuring 9.6 mm roughly stable from the previous exam. There is however a larger node complex which measures approximately 11 by 2.3 cm in dimensions and was not seen on the prior exam. Some left  hilar/AP window nodes are noted which were not seen on the prior exam measuring 12 mm in short axis. Few small left hilar nodes are noted as well. No significant subcarinal or right hilar adenopathy is noted. A stable small right hilar lymph node is noted measuring 10 mm in short axis. The esophagus is within normal limits. Lungs/Pleura: Large right-sided pleural effusion is identified. No definitive enhancement to suggest pleural involvement is seen. Mild emphysematous changes are identified within the right lung the. No sizable infiltrate or nodule is noted within the right lung. Emphysematous changes are also noted in the left lung. There are persistent changes in the medial aspect of the left upper lobe which have increased when compared with the prior exam consistent with some underlying soft tissue change superimposed over the previously seen apical scarring. This is noticeable in the medial apex where there is soft tissue density measuring at least 2.6 x 1.7 cm best seen on image number 25 of series 2. A focal nodule with central decreased attenuation is noted on image number 29 of series 2 measuring 1.7 x 1.2 cm the previously seen pleural based density anteriorly is again identified and relatively stable at approximately 9 mm. Some more or inferior pleural based density is noted anteriorly measuring 12 mm with some increased spiculation. Additional nodularity is noted  measuring 12 mm best seen on image number 62 of series 2 also within the left lower lobe inferiorly. Some smaller pleural based nodules are noted more inferiorly within the lingula. Left lower lobe shows no significant mass lesion. A small stable subpleural nodule is noted best seen on image number 72 of series 3. Upper Abdomen: Gallbladder has been surgically removed. No other focal abnormality in the upper abdomen is noted. Musculoskeletal: Degenerative changes of the thoracic spine are seen. No definitive metastatic disease is seen. No  compression deformities are noted. Changes consistent with prior biopsy and resection in the left breast are noted. Some slight increased density in the left breast is noted although may simply be related to postoperative change. No axillary adenopathy is seen. IMPRESSION: Changes in the left upper lobe with increase in both size and number of soft tissue lesions as described with associated large left pleural effusion. Additionally significant increase in mediastinal and left hilar adenopathy is noted. These changes are consistent with underlying neoplastic involvement. Although this may be primary and pulmonary in etiology the possibility of metastatic disease would deserve consideration. PET-CT and subsequent tissue sampling is recommended for further evaluation. Thoracentesis may also be helpful to assess for any cytology. Slight increase in density within the left breast in the area of prior surgery. This is of uncertain significance. Mammography may be helpful for further evaluation. Aortic Atherosclerosis (ICD10-I70.0) and Emphysema (ICD10-J43.9). These results will be called to the ordering clinician or representative by the Radiologist Assistant, and communication documented in the PACS or zVision Dashboard. Electronically Signed   By: Inez Catalina M.D.   On: 08/23/2017 16:00   Mr Jeri Cos CB Contrast  Result Date: 09/05/2017 CLINICAL DATA:  Diplopia.  Lung cancer.  Possible brain metastases. EXAM: MRI HEAD WITHOUT AND WITH CONTRAST TECHNIQUE: Multiplanar, multiecho pulse sequences of the brain and surrounding structures were obtained without and with intravenous contrast. CONTRAST:  81mL MULTIHANCE GADOBENATE DIMEGLUMINE 529 MG/ML IV SOLN COMPARISON:  MR brain 03/25/2012. FINDINGS: Brain: No evidence for acute infarction, hemorrhage, mass lesion, hydrocephalus, or extra-axial fluid. Generalized atrophy. Moderate T2 and FLAIR hyperintensities throughout the white matter, likely small vessel disease.  Multiple chronic lacunar infarcts. Post infusion, no abnormal enhancement of the brain or meninges. Vascular: Flow voids are maintained throughout the carotid, basilar, and vertebral arteries. There are no areas of chronic hemorrhage. Skull and upper cervical spine: Unremarkable visualized calvarium, skullbase, and cervical vertebrae. Pituitary, pineal, cerebellar tonsils unremarkable. No upper cervical cord lesions. Sinuses/Orbits: No orbital masses or proptosis. Globes appear symmetric. Sinuses appear well aerated, without evidence for air-fluid level. BILATERAL cataract extraction. Other: None. IMPRESSION: Atrophy and small vessel disease. No acute intracranial findings. No cause is seen for the reported symptoms. No visible intracranial metastatic disease or visible orbital abnormality. Electronically Signed   By: Staci Righter M.D.   On: 09/05/2017 09:52   Dg Chest Left Decubitus  Result Date: 09/17/2017 CLINICAL DATA:  History of left pleural effusion. EXAM: CHEST - LEFT DECUBITUS COMPARISON:  CT 09/06/2017.  Chest x-ray 08/27/2017. FINDINGS: A left-side-down decubitus chest x-ray obtained. A layering moderate size pleural effusion is noted. IMPRESSION: Layering moderate size pleural effusion. Electronically Signed   By: Marcello Moores  Register   On: 09/17/2017 12:24   Nm Pet Image Initial (pi) Skull Base To Thigh  Result Date: 09/12/2017 CLINICAL DATA:  Subsequent treatment strategy for lung carcinoma. EXAM: NUCLEAR MEDICINE PET SKULL BASE TO THIGH TECHNIQUE: 6.9 mCi F-18 FDG was injected intravenously. Full-ring PET imaging  was performed from the skull base to thigh after the radiotracer. CT data was obtained and used for attenuation correction and anatomic localization. Fasting blood glucose: 96 mg/dl COMPARISON:  11/29/2015 FINDINGS: Mediastinal blood pool activity: SUV max 2.34 NECK: No hypermetabolic lymph nodes in the neck. Incidental CT findings: none CHEST: Hypermetabolic nodular thickening along the  LEFT upper mediastinal/pleural surface. Example hypermetabolic prevascular lymph node measuring 11 mm with SUV max equal 8.7. More superior nodule along the pleural surface/mediastinum with SUV max equal 14.2. LEFT apical nodule within the pulmonary parenchyma SUV max equal 7.4 measures 11 mm (image 52/4). More anteriorly subpleural nodule with SUV max equal 7.0. Hypermetabolic subpleural nodularity anteriorly within LEFT upper lobe with SUV max equal 4.5 on image 66/4. There is moderate hypermetabolic activity dependently within the LEFT lower lobe effusion with SUV max equal 4.8. Laterally within the LEFT upper lobe along the pleural surface metastatic lesion on image 48 with SUV max equal 5.5 No hypermetabolic nodules in RIGHT lung. Incidental CT findings: Moderate LEFT pleural effusion. ABDOMEN/PELVIS: No abnormal hypermetabolic activity within the liver, pancreas, adrenal glands, or spleen. No hypermetabolic lymph nodes in the abdomen or pelvis. Intense metabolic activity associated with the bowel is physiologic Incidental CT findings: Uterus normal. Diverticulosis sigmoid colonnone SKELETON: No focal hypermetabolic activity to suggest skeletal metastasis. Incidental CT findings: None IMPRESSION: 1. Evidence of breast cancer recurrence at multiple sites within the LEFT hemithorax primarily within the pleural and subpleural lung with pedicular concentration in the medial LEFT upper lobe along the mediastinal pleural surface and the anterior sub pleural lung. Several discrete pulmonary nodules within the LEFT upper lobe are also hypermetabolic consistent with metastasis. 2. Moderate LEFT effusion is concerning for malignant effusion. 3. No evidence of metastatic disease outside of the LEFT hemithorax. Electronically Signed   By: Suzy Bouchard M.D.   On: 09/12/2017 17:02   US Thoracentesis Asp Pleural Space W/img Guide  Result Date: 09/18/2017 INDICATION: Lung cancer. Recurrence left pleural effusion.  Request for therapeutic thoracentesis. EXAM: ULTRASOUND GUIDED LEFT THORACENTESIS MEDICATIONS: 1% lidocaine 10 mL COMPLICATIONS: None immediate. PROCEDURE: An ultrasound guided thoracentesis was thoroughly discussed with the patient and questions answered. The benefits, risks, alternatives and complications were also discussed. The patient understands and wishes to proceed with the procedure. Written consent was obtained. Ultrasound was performed to localize and mark an adequate pocket of fluid in the left chest. The area was then prepped and draped in the normal sterile fashion. 1% Lidocaine was used for local anesthesia. Under ultrasound guidance a 6 Fr Safe-T-Centesis catheter was introduced. Thoracentesis was performed. The catheter was removed and a dressing applied. FINDINGS: A total of approximately 950 mL of is clear amber fluid was removed. IMPRESSION: Successful ultrasound guided left thoracentesis yielding 950 mL of pleural fluid. No pneumothorax on post-procedure chest x-ray. Read by: Gareth Eagle, PA-C Electronically Signed   By: Aletta Edouard M.D.   On: 09/18/2017 16:05   US Thoracentesis Asp Pleural Space W/img Guide  Result Date: 08/25/2017 INDICATION: Patient with left pleural effusion. Request is made for diagnostic and therapeutic thoracentesis. EXAM: ULTRASOUND GUIDED DIAGNOSTIC AND THERAPEUTIC LEFT THORACENTESIS MEDICATIONS: 10 mL 1% lidocaine COMPLICATIONS: None immediate. PROCEDURE: An ultrasound guided thoracentesis was thoroughly discussed with the patient and questions answered. The benefits, risks, alternatives and complications were also discussed. The patient understands and wishes to proceed with the procedure. Written consent was obtained. Ultrasound was performed to localize and mark an adequate pocket of fluid in the left chest. The area  was then prepped and draped in the normal sterile fashion. 1% Lidocaine was used for local anesthesia. Under ultrasound guidance a 6 Fr  Safe-T-Centesis catheter was introduced. Thoracentesis was performed. The catheter was removed and a dressing applied. FINDINGS: A total of approximately 800 mL of amber fluid was removed. Samples were sent to the laboratory as requested by the clinical team. IMPRESSION: Successful ultrasound guided diagnostic and therapeutic left thoracentesis yielding 800 mL of pleural fluid. Read by: Brynda Greathouse PA-C Electronically Signed   By: Fidela Salisbury M.D.   On: 08/25/2017 10:22

## 2017-09-30 ENCOUNTER — Emergency Department (HOSPITAL_COMMUNITY): Payer: Medicare Other

## 2017-09-30 ENCOUNTER — Inpatient Hospital Stay (HOSPITAL_COMMUNITY)
Admission: EM | Admit: 2017-09-30 | Discharge: 2017-10-03 | DRG: 181 | Disposition: A | Discharge status: Hospice | Payer: Medicare Other | Attending: Internal Medicine | Admitting: Internal Medicine

## 2017-09-30 ENCOUNTER — Other Ambulatory Visit: Payer: Self-pay

## 2017-09-30 ENCOUNTER — Other Ambulatory Visit (HOSPITAL_COMMUNITY): Payer: Self-pay

## 2017-09-30 ENCOUNTER — Encounter (HOSPITAL_COMMUNITY): Payer: Self-pay

## 2017-09-30 ENCOUNTER — Telehealth: Payer: Self-pay | Admitting: *Deleted

## 2017-09-30 DIAGNOSIS — K589 Irritable bowel syndrome without diarrhea: Secondary | ICD-10-CM | POA: Diagnosis present

## 2017-09-30 DIAGNOSIS — R531 Weakness: Secondary | ICD-10-CM

## 2017-09-30 DIAGNOSIS — R159 Full incontinence of feces: Secondary | ICD-10-CM | POA: Diagnosis not present

## 2017-09-30 DIAGNOSIS — J910 Malignant pleural effusion: Secondary | ICD-10-CM | POA: Diagnosis not present

## 2017-09-30 DIAGNOSIS — R0602 Shortness of breath: Secondary | ICD-10-CM | POA: Diagnosis present

## 2017-09-30 DIAGNOSIS — Z8673 Personal history of transient ischemic attack (TIA), and cerebral infarction without residual deficits: Secondary | ICD-10-CM

## 2017-09-30 DIAGNOSIS — Z79811 Long term (current) use of aromatase inhibitors: Secondary | ICD-10-CM

## 2017-09-30 DIAGNOSIS — R627 Adult failure to thrive: Secondary | ICD-10-CM | POA: Diagnosis present

## 2017-09-30 DIAGNOSIS — M797 Fibromyalgia: Secondary | ICD-10-CM | POA: Diagnosis present

## 2017-09-30 DIAGNOSIS — E039 Hypothyroidism, unspecified: Secondary | ICD-10-CM | POA: Diagnosis present

## 2017-09-30 DIAGNOSIS — Z7901 Long term (current) use of anticoagulants: Secondary | ICD-10-CM

## 2017-09-30 DIAGNOSIS — I350 Nonrheumatic aortic (valve) stenosis: Secondary | ICD-10-CM | POA: Diagnosis present

## 2017-09-30 DIAGNOSIS — F329 Major depressive disorder, single episode, unspecified: Secondary | ICD-10-CM | POA: Diagnosis present

## 2017-09-30 DIAGNOSIS — Z87891 Personal history of nicotine dependence: Secondary | ICD-10-CM

## 2017-09-30 DIAGNOSIS — F419 Anxiety disorder, unspecified: Secondary | ICD-10-CM | POA: Diagnosis present

## 2017-09-30 DIAGNOSIS — J90 Pleural effusion, not elsewhere classified: Secondary | ICD-10-CM | POA: Diagnosis present

## 2017-09-30 DIAGNOSIS — I4891 Unspecified atrial fibrillation: Secondary | ICD-10-CM | POA: Diagnosis not present

## 2017-09-30 DIAGNOSIS — Z7951 Long term (current) use of inhaled steroids: Secondary | ICD-10-CM

## 2017-09-30 DIAGNOSIS — Z515 Encounter for palliative care: Secondary | ICD-10-CM | POA: Diagnosis not present

## 2017-09-30 DIAGNOSIS — C3492 Malignant neoplasm of unspecified part of left bronchus or lung: Principal | ICD-10-CM | POA: Diagnosis present

## 2017-09-30 DIAGNOSIS — J449 Chronic obstructive pulmonary disease, unspecified: Secondary | ICD-10-CM | POA: Diagnosis not present

## 2017-09-30 DIAGNOSIS — Z853 Personal history of malignant neoplasm of breast: Secondary | ICD-10-CM

## 2017-09-30 DIAGNOSIS — Z79890 Hormone replacement therapy (postmenopausal): Secondary | ICD-10-CM

## 2017-09-30 DIAGNOSIS — Z66 Do not resuscitate: Secondary | ICD-10-CM | POA: Diagnosis not present

## 2017-09-30 DIAGNOSIS — Z79899 Other long term (current) drug therapy: Secondary | ICD-10-CM

## 2017-09-30 DIAGNOSIS — H532 Diplopia: Secondary | ICD-10-CM | POA: Diagnosis present

## 2017-09-30 DIAGNOSIS — E785 Hyperlipidemia, unspecified: Secondary | ICD-10-CM | POA: Diagnosis present

## 2017-09-30 DIAGNOSIS — I10 Essential (primary) hypertension: Secondary | ICD-10-CM | POA: Diagnosis present

## 2017-09-30 DIAGNOSIS — R32 Unspecified urinary incontinence: Secondary | ICD-10-CM | POA: Diagnosis not present

## 2017-09-30 LAB — URINALYSIS, ROUTINE W REFLEX MICROSCOPIC
Bilirubin Urine: NEGATIVE
Glucose, UA: NEGATIVE mg/dL
HGB URINE DIPSTICK: NEGATIVE
KETONES UR: NEGATIVE mg/dL
NITRITE: NEGATIVE
PROTEIN: NEGATIVE mg/dL
Specific Gravity, Urine: 1.023 (ref 1.005–1.030)
pH: 5 (ref 5.0–8.0)

## 2017-09-30 LAB — RAPID URINE DRUG SCREEN, HOSP PERFORMED
AMPHETAMINES: NOT DETECTED
Barbiturates: NOT DETECTED
Benzodiazepines: NOT DETECTED
Cocaine: NOT DETECTED
OPIATES: NOT DETECTED
Tetrahydrocannabinol: NOT DETECTED

## 2017-09-30 LAB — I-STAT TROPONIN, ED: Troponin i, poc: 0.01 ng/mL (ref 0.00–0.08)

## 2017-09-30 LAB — CBC WITH DIFFERENTIAL/PLATELET
BASOS ABS: 0.1 10*3/uL (ref 0.0–0.1)
Basophils Relative: 1 %
Eosinophils Absolute: 0.1 10*3/uL (ref 0.0–0.7)
Eosinophils Relative: 1 %
HCT: 44 % (ref 36.0–46.0)
Hemoglobin: 13.7 g/dL (ref 12.0–15.0)
Lymphocytes Relative: 11 %
Lymphs Abs: 1.2 10*3/uL (ref 0.7–4.0)
MCH: 33.1 pg (ref 26.0–34.0)
MCHC: 31.1 g/dL (ref 30.0–36.0)
MCV: 106.3 fL — ABNORMAL HIGH (ref 78.0–100.0)
MONO ABS: 1 10*3/uL (ref 0.1–1.0)
Monocytes Relative: 9 %
Neutro Abs: 9.1 10*3/uL — ABNORMAL HIGH (ref 1.7–7.7)
Neutrophils Relative %: 80 %
PLATELETS: 457 10*3/uL — AB (ref 150–400)
RBC: 4.14 MIL/uL (ref 3.87–5.11)
RDW: 13.3 % (ref 11.5–15.5)
WBC: 11.5 10*3/uL — ABNORMAL HIGH (ref 4.0–10.5)

## 2017-09-30 LAB — COMPREHENSIVE METABOLIC PANEL
ALK PHOS: 58 U/L (ref 38–126)
ALT: 12 U/L (ref 0–44)
AST: 23 U/L (ref 15–41)
Albumin: 3.3 g/dL — ABNORMAL LOW (ref 3.5–5.0)
Anion gap: 14 (ref 5–15)
BILIRUBIN TOTAL: 0.4 mg/dL (ref 0.3–1.2)
BUN: 22 mg/dL (ref 8–23)
CALCIUM: 9 mg/dL (ref 8.9–10.3)
CO2: 35 mmol/L — ABNORMAL HIGH (ref 22–32)
Chloride: 90 mmol/L — ABNORMAL LOW (ref 98–111)
Creatinine, Ser: 0.75 mg/dL (ref 0.44–1.00)
GFR calc Af Amer: >60 mL/min (ref >60)
GFR calc non Af Amer: >60 mL/min (ref >60)
Glucose, Bld: 108 mg/dL — ABNORMAL HIGH (ref 70–99)
Potassium: 3.9 mmol/L (ref 3.5–5.1)
Sodium: 139 mmol/L (ref 135–145)
Total Protein: 6.9 g/dL (ref 6.5–8.1)

## 2017-09-30 LAB — BRAIN NATRIURETIC PEPTIDE: B NATRIURETIC PEPTIDE 5: 36.9 pg/mL (ref 0.0–100.0)

## 2017-09-30 LAB — PROTIME-INR
INR: 2.36
Prothrombin Time: 25.6 seconds — ABNORMAL HIGH (ref 11.4–15.2)

## 2017-09-30 LAB — I-STAT CG4 LACTIC ACID, ED: LACTIC ACID, VENOUS: 1.9 mmol/L (ref 0.5–1.9)

## 2017-09-30 LAB — LIPASE, BLOOD: Lipase: 21 U/L (ref 11–51)

## 2017-09-30 MED ORDER — ACETAMINOPHEN 325 MG PO TABS
650.0000 mg | ORAL_TABLET | ORAL | Status: DC | PRN
  Administered 2017-10-01 – 2017-10-03 (×6): 650 mg via ORAL
  Filled 2017-09-30 (×6): qty 2

## 2017-09-30 MED ORDER — ANASTROZOLE 1 MG PO TABS
1.0000 mg | ORAL_TABLET | Freq: Every day | ORAL | Status: DC
  Administered 2017-10-01 – 2017-10-02 (×2): 1 mg via ORAL
  Filled 2017-09-30 (×2): qty 1

## 2017-09-30 MED ORDER — SPIRONOLACTONE 25 MG PO TABS
25.0000 mg | ORAL_TABLET | Freq: Every day | ORAL | Status: DC
  Administered 2017-10-01 – 2017-10-03 (×3): 25 mg via ORAL
  Filled 2017-09-30 (×3): qty 1

## 2017-09-30 MED ORDER — AMIODARONE HCL 100 MG PO TABS
100.0000 mg | ORAL_TABLET | Freq: Every day | ORAL | Status: DC
  Administered 2017-10-01 – 2017-10-02 (×2): 100 mg via ORAL
  Filled 2017-09-30 (×2): qty 1

## 2017-09-30 MED ORDER — MAGNESIUM OXIDE 400 MG PO TABS: 400.0000 mg | ORAL_TABLET | Freq: Two times a day (BID) | ORAL | Status: DC

## 2017-09-30 MED ORDER — SERTRALINE HCL 50 MG PO TABS
25.0000 mg | ORAL_TABLET | Freq: Every day | ORAL | Status: DC
  Administered 2017-10-01 – 2017-10-03 (×3): 25 mg via ORAL
  Filled 2017-09-30 (×3): qty 1

## 2017-09-30 MED ORDER — ADULT MULTIVITAMIN W/MINERALS CH
1.0000 | ORAL_TABLET | Freq: Every day | ORAL | Status: DC
  Administered 2017-10-01 – 2017-10-02 (×2): 1 via ORAL
  Filled 2017-09-30 (×2): qty 1

## 2017-09-30 MED ORDER — LORAZEPAM 0.5 MG PO TABS
0.5000 mg | ORAL_TABLET | Freq: Two times a day (BID) | ORAL | Status: DC | PRN
Start: 2017-09-30 — End: 2017-10-03
  Administered 2017-10-01 – 2017-10-02 (×2): 0.5 mg via ORAL
  Filled 2017-09-30 (×3): qty 1

## 2017-09-30 MED ORDER — FUROSEMIDE 40 MG PO TABS
40.0000 mg | ORAL_TABLET | Freq: Every day | ORAL | Status: DC
  Administered 2017-10-01 – 2017-10-03 (×3): 40 mg via ORAL
  Filled 2017-09-30 (×4): qty 1

## 2017-09-30 MED ORDER — CALCIUM CARBONATE 600 MG PO TABS: 600.0000 mg | ORAL_TABLET | Freq: Every day | ORAL | Status: DC

## 2017-09-30 MED ORDER — ONDANSETRON HCL 4 MG/2ML IJ SOLN: 4.0000 mg | Freq: Four times a day (QID) | INTRAMUSCULAR | Status: DC | PRN

## 2017-09-30 MED ORDER — LOSARTAN POTASSIUM 50 MG PO TABS
100.0000 mg | ORAL_TABLET | Freq: Every day | ORAL | Status: DC
  Administered 2017-10-01 – 2017-10-02 (×2): 100 mg via ORAL
  Filled 2017-09-30 (×2): qty 2

## 2017-09-30 MED ORDER — ALBUTEROL SULFATE (2.5 MG/3ML) 0.083% IN NEBU
2.5000 mg | INHALATION_SOLUTION | Freq: Four times a day (QID) | RESPIRATORY_TRACT | Status: DC | PRN
  Administered 2017-10-01 – 2017-10-02 (×4): 2.5 mg via RESPIRATORY_TRACT
  Filled 2017-09-30 (×4): qty 3

## 2017-09-30 MED ORDER — ENSURE ENLIVE PO LIQD
237.0000 mL | Freq: Two times a day (BID) | ORAL | Status: DC
  Administered 2017-10-01 (×2): 237 mL via ORAL

## 2017-09-30 MED ORDER — ONE-DAILY MULTI VITAMINS PO TABS: 1.0000 | ORAL_TABLET | Freq: Every day | ORAL | Status: DC

## 2017-09-30 MED ORDER — MIRTAZAPINE 30 MG PO TABS
15.0000 mg | ORAL_TABLET | Freq: Every day | ORAL | Status: DC
  Administered 2017-09-30 – 2017-10-02 (×3): 15 mg via ORAL
  Filled 2017-09-30 (×3): qty 1

## 2017-09-30 MED ORDER — ONDANSETRON HCL 4 MG PO TABS: 4.0000 mg | ORAL_TABLET | Freq: Four times a day (QID) | ORAL | Status: DC | PRN

## 2017-09-30 MED ORDER — FOLIC ACID 400 MCG PO TABS
400.0000 ug | ORAL_TABLET | Freq: Every day | ORAL | Status: DC
Start: 2017-10-01 — End: 2017-09-30

## 2017-09-30 MED ORDER — FOLIC ACID 1 MG PO TABS
0.5000 mg | ORAL_TABLET | Freq: Every day | ORAL | Status: DC
  Administered 2017-10-01 – 2017-10-02 (×2): 0.5 mg via ORAL
  Filled 2017-09-30 (×2): qty 1

## 2017-09-30 MED ORDER — ATORVASTATIN CALCIUM 10 MG PO TABS
10.0000 mg | ORAL_TABLET | Freq: Every day | ORAL | Status: DC
Start: 2017-10-01 — End: 2017-10-02
  Administered 2017-10-01 – 2017-10-02 (×2): 10 mg via ORAL
  Filled 2017-09-30 (×2): qty 1

## 2017-09-30 MED ORDER — LEVOTHYROXINE SODIUM 125 MCG PO TABS
125.0000 ug | ORAL_TABLET | Freq: Every day | ORAL | Status: DC
  Administered 2017-10-01 – 2017-10-02 (×2): 125 ug via ORAL
  Filled 2017-09-30 (×2): qty 1

## 2017-09-30 MED ORDER — ALBUTEROL SULFATE HFA 108 (90 BASE) MCG/ACT IN AERS: 2.0000 | INHALATION_SPRAY | Freq: Four times a day (QID) | RESPIRATORY_TRACT | Status: DC | PRN

## 2017-09-30 MED ORDER — ACETAMINOPHEN 500 MG PO TABS
1000.0000 mg | ORAL_TABLET | Freq: Once | ORAL | Status: AC
  Administered 2017-09-30: 1000 mg via ORAL
  Filled 2017-09-30: qty 2

## 2017-09-30 MED ORDER — CALCIUM CARBONATE 1250 (500 CA) MG PO TABS
1.0000 | ORAL_TABLET | Freq: Every day | ORAL | Status: DC
  Administered 2017-10-01 – 2017-10-02 (×2): 500 mg via ORAL
  Filled 2017-09-30 (×2): qty 1

## 2017-09-30 MED ORDER — RIVAROXABAN 20 MG PO TABS
20.0000 mg | ORAL_TABLET | Freq: Every day | ORAL | Status: DC
Start: 2017-09-30 — End: 2017-10-01
  Administered 2017-09-30: 20 mg via ORAL
  Filled 2017-09-30: qty 1

## 2017-09-30 MED ORDER — VITAMIN B-12 1000 MCG PO TABS
1000.0000 ug | ORAL_TABLET | Freq: Every day | ORAL | Status: DC
  Administered 2017-10-01 – 2017-10-02 (×2): 1000 ug via ORAL
  Filled 2017-09-30 (×2): qty 1

## 2017-09-30 MED ORDER — AMLODIPINE BESYLATE 5 MG PO TABS
5.0000 mg | ORAL_TABLET | Freq: Every day | ORAL | Status: DC
  Administered 2017-10-01 – 2017-10-02 (×2): 5 mg via ORAL
  Filled 2017-09-30 (×2): qty 1

## 2017-09-30 MED ORDER — MOMETASONE FURO-FORMOTEROL FUM 100-5 MCG/ACT IN AERO
2.0000 | INHALATION_SPRAY | Freq: Two times a day (BID) | RESPIRATORY_TRACT | Status: DC
  Administered 2017-09-30 – 2017-10-03 (×6): 2 via RESPIRATORY_TRACT
  Filled 2017-09-30: qty 8.8

## 2017-09-30 MED ORDER — MAGNESIUM OXIDE 400 (241.3 MG) MG PO TABS
400.0000 mg | ORAL_TABLET | Freq: Two times a day (BID) | ORAL | Status: DC
  Administered 2017-09-30 – 2017-10-02 (×4): 400 mg via ORAL
  Filled 2017-09-30 (×4): qty 1

## 2017-09-30 NOTE — Telephone Encounter (Signed)
Received call from female family member that pt is not doing well & reports incontinence of urine/stool & confusion, lethargy.  Discussed with Dr Lindi Adie & informed family member to take to ED.  Family expressed understanding.

## 2017-09-30 NOTE — H&P (Signed)
History and Physical    [name] WNI:[id number] DOB: [birth year] DOA: 09/30/2017  PCP: [name], MD  Patient coming from: Home  I have personally briefly reviewed patient's old medical records in [place]  Chief Complaint: SOB, diplopia  HPI: [name] is a 83 y.o. female with medical history significant of BRCA in past, a.fib, COPD, HTN.  Patient has been having trouble with recurrent pleural effusion drained twice over past month.  Cytology on drainage 6/30 appears c/w NSCLC (squamous cell) carcinoma of lung.  Also has h/o multiple pulm nodules.  Over last 2 weeks has had a lot of SOB.  Therefore she can hardly get out of bed now.  She is having incontinence of urine.  She also reports fairly frequent stool with some incontinence.  No abdominal pain.  Sons note a little bit of swelling in her legs.  Also problems with double vision that have been worsening over the past month.  MRI brain done a month ago was negative.   ED Course: L Pleural effusion is back and recurrent.  Bicarb 35.  CT head shows L retroconal mass, likely met.   Review of Systems: As per HPI otherwise 10 point review of systems negative.   Past Medical History:  Diagnosis Date  . A-fib ([place])    takes Amiodarone and Xarelto daily  . Allergy history unknown   . Anxiety    takes Ativan daily as needed  . Aortic stenosis    mild 02/2015 (Dr. Terrence Dupont)  . Arthritis   . Breast cancer ([name])   . Breast cancer of lower-inner quadrant of left female breast ([place]) 02/09/2015  . Bruises easily   . COPD (chronic obstructive pulmonary disease) (H[facility])    ALdactone and Spirva daily.Dulera daily.Albuterol daily as needed  . COPD, severe   . Depression   . Emphysema (subcutaneous) (surgical) resulting from a procedure   . Fibromyalgia   . Fibromyalgia   . History of bronchitis 2-3 yrs ago  . History of colon polyps    benign  . Hyperlipidemia    takes Atorvastatin daily  . Hypertension    takes Losartan  daily  . Hypothyroidism    takes Synthroid daily  . IBS (irritable bowel syndrome)   . Joint pain   . Osteoarthritis   . Peripheral edema    takes Furosemide and Spirolactone daily  . Peripheral neuropathy   . Pneumonia    hx of-9 yrs ago  . Radiation 05/05/15-07/07/15   left breast 50.4 Gy, left breast boost 14 Gy, left axilla/SCF 50.4 Gy  . Radiation 05/05/15-07/07/15   left breast 50.4 Gy boosted to 14 gy  . Shortness of breath dyspnea    with exertion  . Sinus problem   . Stroke [facility])    affected left leg    Past Surgical History:  Procedure Laterality Date  . APPENDECTOMY    . blephorplasty Bilateral   . BREAST LUMPECTOMY    . BREAST LUMPECTOMY WITH RADIOACTIVE SEED AND AXILLARY LYMPH NODE DISSECTION Left 03/22/2015   Procedure: BREAST LUMPECTOMY WITH RADIOACTIVE SEED AND AXILLARY LYMPH NODE DISSECTION;  Surgeon: Alphonsa Overall, MD;  Location: [place];  Service: General;  Laterality: Left;  Left axilla and Lyph node excision  . cataract surgery Bilateral   . CHOLECYSTECTOMY    . COLONOSCOPY    . HEMORRHOID SURGERY    . TONSILLECTOMY    . TUBAL LIGATION    . VIDEO BRONCHOSCOPY WITH ENDOBRONCHIAL NAVIGATION N/A 12/07/2015  Procedure: VIDEO BRONCHOSCOPY WITH ENDOBRONCHIAL NAVIGATION;  Surgeon: Collene Gobble, MD;  Location: [facility];  Service: Thoracic;  Laterality: N/A;     reports that she quit smoking about 14 years ago. She smoked 0.50 packs per day. She has never used smokeless tobacco. She reports that she drinks alcohol. She reports that she does not use drugs.  Allergies  Allergen Reactions  . Codeine Swelling    SWELLING REACTION UNSPECIFIED   . [name] Other (See Comments)    UNSPECIFIED REACTION  Has patient had a PCN reaction causing immediate rash, facial/tongue/throat swelling, SOB or lightheadedness with hypotension:unsure Has patient had a PCN reaction causing severe rash involving mucus membranes or skin necrosis:unsure Has patient had a PCN reaction that  required hospitalization:NO Has patient had a PCN reaction occurring within the last 10 years:NO If all of the above answers are "NO", then may proceed with Cephalosporin use.   [name] [Silver Sulfadiazine] Rash    Family History  Adopted: Yes     Prior to Admission medications   Medication Sig Start Date End Date Taking? Authorizing Provider  acetaminophen (TYLENOL) 325 MG tablet Take 650 mg by mouth every 4 (four) hours as needed (FOR PAIN.). Reported on 04/20/2015   Yes [provider]  albuterol (PROVENTIL HFA;VENTOLIN HFA) 108 (90 Base) MCG/ACT inhaler Inhale 2 puffs into the lungs every 6 (six) hours as needed for wheezing or shortness of breath.   Yes [provider]  amiodarone (PACERONE) 200 MG tablet Take 100 mg by mouth daily.  07/18/10  Yes [provider]  amLODipine (NORVASC) 5 MG tablet Take 5 mg by mouth daily. 11/24/15  Yes [provider]  anastrozole (ARIMIDEX) 1 MG tablet Take 1 tablet (1 mg total) by mouth daily. 08/15/17  Yes Nicholas Lose, MD  atorvastatin (LIPITOR) 10 MG tablet Take 10 mg by mouth daily.  08/29/14  Yes [provider]  BIOTIN PO Take 1 tablet by mouth daily.   Yes [provider]  furosemide (LASIX) 40 MG tablet Take 40 mg by mouth daily. Reported on 06/28/2015 04/10/11  Yes [provider]  IRON PO Take 1 tablet by mouth daily.   Yes [provider]  levothyroxine (SYNTHROID, LEVOTHROID) 125 MCG tablet Take 1 tablet by mouth daily. 09/27/16  Yes [provider]  LORazepam (ATIVAN) 0.5 MG tablet Take 0.5 mg by mouth 2 (two) times daily as needed for anxiety. 08/19/17  Yes [provider]  losartan (COZAAR) 100 MG tablet Take 100 mg by mouth daily.   Yes [provider]  mirtazapine (REMERON) 15 MG tablet Take 1 tablet (15 mg total) by mouth at bedtime. 09/17/17  Yes Tanner, Lyndon Code., PA-C  mometasone-formoterol (DULERA) 100-5 MCG/ACT AERO Inhale 2 puffs into the  lungs 2 (two) times daily. 03/17/13  Yes Young, Tarri Fuller D, MD  rivaroxaban (XARELTO) 20 MG TABS tablet Take 1 tablet (20 mg total) by mouth daily with supper. Please restart this medication on Friday 12/09/15. 12/07/15  Yes Collene Gobble, MD  sertraline (ZOLOFT) 25 MG tablet Take 25 mg by mouth daily. 06/13/17  Yes [provider]  spironolactone (ALDACTONE) 25 MG tablet Take 25 mg by mouth daily.  11/27/14  Yes [provider]  calcium carbonate (OS-CAL) 600 MG TABS tablet Take 600 mg by mouth daily with breakfast. Reported on 08/18/2015    [provider]  folic acid (FOLVITE) 563 MCG tablet Take 400 mcg by mouth daily.    [provider]  magnesium oxide (MAG-OX) 400 MG tablet Take 400 mg by mouth 2 (two) times daily.      [provider]  Multiple Vitamin (MULTIVITAMIN) tablet Take 1 tablet by mouth daily.    [provider]  tiotropium (SPIRIVA) 18 MCG inhalation capsule Place 1 capsule (18 mcg total) into inhaler and inhale daily. Patient not taking: Reported on 09/30/2017 11/05/12   Deneise Lever, MD  vitamin B-12 (CYANOCOBALAMIN) 1000 MCG tablet Take 1,000 mcg by mouth daily. Reported on 08/18/2015    [provider]  digoxin (LANOXIN) 0.125 MG tablet Take 1 tablet by mouth Daily. 07/18/10 08/27/17  [provider]    Physical Exam: Vitals:   09/30/17 1530 09/30/17 1700 09/30/17 1830 09/30/17 1900  BP: (!) 150/83 (!) 132/103 (!) 132/108 (!) 140/121  Pulse: (!) 56 76 88 85  Resp: (!) 26 (!) 30 (!) 22 (!) 24  Temp:      TempSrc:      SpO2: 96% 95% 95% 99%  Weight:      Height:        Constitutional: NAD, calm, comfortable Eyes: PERRL, lids and conjunctivae normal ENMT: Mucous membranes are moist. Posterior pharynx clear of any exudate or lesions.Normal dentition.  Neck: normal, supple, no masses, no thyromegaly Respiratory: Dyspneic on exam. Cardiovascular: Regular rate and rhythm, no murmurs / rubs / gallops. No  extremity edema. 2+ pedal pulses. No carotid bruits.  Abdomen: no tenderness, no masses palpated. No hepatosplenomegaly. Bowel sounds positive.  Musculoskeletal: no clubbing / cyanosis. No joint deformity upper and lower extremities. Good ROM, no contractures. Normal muscle tone.  Skin: no rashes, lesions, ulcers. No induration Neurologic: CN 2-12 grossly intact. Sensation intact, DTR normal. Strength 5/5 in all 4.  Psychiatric: Normal judgment and insight. Alert and oriented x 3. Normal mood.    Labs on Admission: I have personally reviewed following labs and imaging studies  CBC: Recent Labs  Lab 09/30/17 1637  WBC 11.5*  NEUTROABS 9.1*  HGB 13.7  HCT 44.0  MCV 106.3*  PLT 209*   Basic Metabolic Panel: Recent Labs  Lab 09/30/17 1637  NA 139  K 3.9  CL 90*  CO2 35*  GLUCOSE 108*  BUN 22  CREATININE 0.75  CALCIUM 9.0   GFR: Estimated Creatinine Clearance: 45.6 mL/min (by C-G formula based on SCr of 0.75 mg/dL). Liver Function Tests: Recent Labs  Lab 09/30/17 1637  AST 23  ALT 12  ALKPHOS 58  BILITOT 0.4  PROT 6.9  ALBUMIN 3.3*   Recent Labs  Lab 09/30/17 1637  LIPASE 21   No results for input(s): AMMONIA in the last 168 hours. Coagulation Profile: Recent Labs  Lab 09/30/17 1637  INR 2.36   Cardiac Enzymes: No results for input(s): CKTOTAL, CKMB, CKMBINDEX, TROPONINI in the last 168 hours. BNP (last 3 results) Recent Labs    08/22/17 1716  PROBNP 60.0   HbA1C: No results for input(s): HGBA1C in the last 72 hours. CBG: No results for input(s): GLUCAP in the last 168 hours. Lipid Profile: No results for input(s): CHOL, HDL, LDLCALC, TRIG, CHOLHDL, LDLDIRECT in the last 72 hours. Thyroid Function Tests: No results for input(s): TSH, T4TOTAL, FREET4, T3FREE, THYROIDAB in the last 72 hours. Anemia Panel: No results for input(s): VITAMINB12, FOLATE, FERRITIN, TIBC, IRON, RETICCTPCT in the last 72 hours. Urine analysis:    Component Value  Date/Time   COLORURINE YELLOW 09/30/2017 [place] 09/30/2017 1637   LABSPEC 1.023 09/30/2017  [place] 5.0 09/30/2017 1637   GLUCOSEU NEGATIVE 09/30/2017 [place] 09/30/2017 [place] 09/30/2017 [place] 09/30/2017 1637   PROTEINUR NEGATIVE 09/30/2017 1637   UROBILINOGEN 0.2 03/25/2012 1507   NITRITE NEGATIVE 09/30/2017 1637   LEUKOCYTESUR TRACE (A) 09/30/2017 1637    Radiological Exams on Admission: Dg Chest 2 View  Result Date: 09/30/2017 CLINICAL DATA:  Increasing shortness of breath. EXAM: CHEST - 2 VIEW COMPARISON:  09/18/2017 FINDINGS: Cardiac silhouette is obscured by significantly enlarging left pleural effusion. No right pleural effusion identified. Right lung appears clear. IMPRESSION: 1. Increase in volume left pleural effusion. Electronically Signed   By: Kerby Moors M.D.   On: 09/30/2017 17:24   Ct Head Wo Contrast  Result Date: 09/30/2017 CLINICAL DATA:  83 year old female with new incontinence. Disoriented and seeing double. Not eating properly. Lung cancer and breast cancer. Initial encounter. EXAM: CT HEAD WITHOUT CONTRAST TECHNIQUE: Contiguous axial images were obtained from the base of the skull through the vertex without intravenous contrast. COMPARISON:  09/05/2017 brain MR. FINDINGS: Brain: Exam is motion degraded. No intracranial hemorrhage or CT evidence of large acute infarct. Chronic microvascular changes. Atrophy. No intracranial mass lesion noted on this unenhanced exam. Vascular: Vascular calcifications Skull: No acute abnormality Sinuses/Orbits: Suspect retroconal mass left orbit, possibly metastatic disease. Other: Mastoid air cells and middle ear cavities are clear. IMPRESSION: 1. Exam is motion degraded. 2. No intracranial hemorrhage or CT evidence of large acute infarct. Chronic microvascular changes. Atrophy. 3. Suspect retroconal mass left orbit, possibly metastatic disease. Electronically  Signed   By: Genia Del M.D.   On: 09/30/2017 17:03    EKG: Independently reviewed.  Assessment/Plan Principal Problem:   Squamous cell lung cancer, left (HCC) Active Problems:   Essential hypertension   COPD mixed type (HCC)   DYSPNEA   A-fib (HCC)   Recurrent pleural effusion on left   Diplopia    1. NSCLC - 1. Causing recurrent L pleural effusion, also appears to have Met to L retroconal area causing diplopia. 2. Dr. Lindi Adie to see in AM 2. Recurrent malignant pleural effusion on left - 1. Causing dyspnea, hypercapnea 2. IR to eval and drain 3. Consider pleurex catheter placement 3. Diplopia - 1. Due to retroconal mass, likely met as demonstrated on CT this evening. 2. Dr. Lindi Adie to see and make recs in AM. 4. A.Fib - 1. Cont amiodarone 2. Cont xarelto 5. HTN - continue home BP meds 6. COPD - continue home nebs  DVT prophylaxis: Xarelto Code Status: Full Family Communication: Family at bedside Disposition Plan: Home after admit Consults called: Oncology to see in AM Admission status: Place in [place]   Etta Quill. DO [facility] Pager [phone] Only works nights!  If 7AM-7PM, please contact the primary day team physician taking care of patient  [email or web address] Password [facility]  09/30/2017, 8:23 PM

## 2017-09-30 NOTE — ED Notes (Signed)
ED TO INPATIENT HANDOFF REPORT  Name/Age/Gender [name] 83 y.o. female  Code Status    Code Status Orders  (From admission, onward)        Start     Ordered   09/30/17 1952  Full code  Continuous     09/30/17 1952    Code Status History    Date Active Date Inactive Code Status Order ID Comments User Context   08/24/2017 1454 08/25/2017 1549 Full Code [id number]  Doreatha Lew, MD ED   03/22/2015 1623 03/23/2015 1249 Full Code [id number]  Alphonsa Overall, MD Inpatient   03/25/2012 2008 03/27/2012 2230 Full Code [id number]  Joycelyn Man, RN Inpatient    Advance Directive Documentation     Most Recent Value  Type of Advance Directive  Living will  Pre-existing out of facility DNR order (yellow form or pink MOST form)  -  "MOST" Form in Place?  -      Home/SNF/Other Home  Chief Complaint cancer pt/ not eating / fatigue   Level of Care/Admitting Diagnosis ED Disposition    ED Disposition Condition Comment   [place]: [facility] [[id number]]  Level of Care: Med-Surg [16]  Diagnosis: Recurrent pleural effusion on left [979892]  Admitting Physician: Etta Quill [[id number]]  Attending Physician: Etta Quill [[id number]]  PT Class (Do Not Modify): Observation [104]  PT Acc Code (Do Not Modify): Observation [10022]       Medical History Past Medical History:  Diagnosis Date  . A-fib ([place])    takes Amiodarone and Xarelto daily  . Allergy history unknown   . Anxiety    takes Ativan daily as needed  . Aortic stenosis    mild 02/2015 (Dr. Terrence Dupont)  . Arthritis   . Breast cancer ([place])   . Breast cancer of lower-inner quadrant of left female breast ([place]) 02/09/2015  . Bruises easily   . COPD (chronic obstructive pulmonary disease) ([facility])    ALdactone and Spirva daily.Dulera daily.Albuterol daily as needed  . COPD, severe   . Depression   . Emphysema (subcutaneous) (surgical) resulting from a procedure   . Fibromyalgia   .  Fibromyalgia   . History of bronchitis 2-3 yrs ago  . History of colon polyps    benign  . Hyperlipidemia    takes Atorvastatin daily  . Hypertension    takes Losartan daily  . Hypothyroidism    takes Synthroid daily  . IBS (irritable bowel syndrome)   . Joint pain   . Osteoarthritis   . Peripheral edema    takes Furosemide and Spirolactone daily  . Peripheral neuropathy   . Pneumonia    hx of-9 yrs ago  . Radiation 05/05/15-07/07/15   left breast 50.4 Gy, left breast boost 14 Gy, left axilla/SCF 50.4 Gy  . Radiation 05/05/15-07/07/15   left breast 50.4 Gy boosted to 14 gy  . Shortness of breath dyspnea    with exertion  . Sinus problem   . Stroke [facility])    affected left leg    Allergies Allergies  Allergen Reactions  . Codeine Swelling    SWELLING REACTION UNSPECIFIED   . Penicillins Other (See Comments)    UNSPECIFIED REACTION  Has patient had a PCN reaction causing immediate rash, facial/tongue/throat swelling, SOB or lightheadedness with hypotension:unsure Has patient had a PCN reaction causing severe rash involving mucus membranes or skin necrosis:unsure Has patient had a PCN reaction that required hospitalization:NO Has patient had a PCN  reaction occurring within the last 10 years:NO If all of the above answers are "NO", then may proceed with Cephalosporin use.   [name] [Silver Sulfadiazine] Rash    IV Location/Drains/Wounds Patient Lines/Drains/Airways Status   Active Line/Drains/Airways    Name:   Placement date:   Placement time:   Site:   Days:   Closed System Drain 1 Left;Inferior Breast Bulb (JP) 19 Fr.   03/22/15    1229    Breast   923          Labs/Imaging Results for orders placed or performed during the hospital encounter of 09/30/17 (from the past 48 hour(s))  Comprehensive metabolic panel     Status: Abnormal   Collection Time: 09/30/17  4:37 PM  Result Value Ref Range   Sodium 139 135 - 145 mmol/L   Potassium 3.9 3.5 - 5.1 mmol/L    Chloride 90 (L) 98 - 111 mmol/L   CO2 35 (H) 22 - 32 mmol/L   Glucose, Bld 108 (H) 70 - 99 mg/dL   BUN 22 8 - 23 mg/dL   Creatinine, Ser 0.75 0.44 - 1.00 mg/dL   Calcium 9.0 8.9 - 10.3 mg/dL   Total Protein 6.9 6.5 - 8.1 g/dL   Albumin 3.3 (L) 3.5 - 5.0 g/dL   AST 23 15 - 41 U/L   ALT 12 0 - 44 U/L   Alkaline Phosphatase 58 38 - 126 U/L   Total Bilirubin 0.4 0.3 - 1.2 mg/dL   GFR calc non Af Amer >60 >60 mL/min   GFR calc Af Amer >60 >60 mL/min    Comment: (NOTE) The eGFR has been calculated using the CKD EPI equation. This calculation has not been validated in all clinical situations. eGFR's persistently <60 mL/min signify possible Chronic Kidney Disease.    Anion gap 14 5 - 15    Comment: Performed at [facility], [place] [address] [place], [place] [address]  Lipase, blood     Status: None   Collection Time: 09/30/17  4:37 PM  Result Value Ref Range   Lipase 21 11 - 51 U/L    Comment: Performed at [facility], [place] [address], [place], [place] [address]  Brain natriuretic peptide     Status: None   Collection Time: 09/30/17  4:37 PM  Result Value Ref Range   B Natriuretic Peptide 36.9 0.0 - 100.0 pg/mL    Comment: Performed at [facility], [place] [address] [place], [place] [address]  CBC with Differential     Status: Abnormal   Collection Time: 09/30/17  4:37 PM  Result Value Ref Range   WBC 11.5 (H) 4.0 - 10.5 K/uL   RBC 4.14 3.87 - 5.11 MIL/uL   Hemoglobin 13.7 12.0 - 15.0 g/dL   HCT 44.0 36.0 - 46.0 %   MCV 106.3 (H) 78.0 - 100.0 fL   MCH 33.1 26.0 - 34.0 pg   MCHC 31.1 30.0 - 36.0 g/dL   RDW 13.3 11.5 - 15.5 %   Platelets 457 (H) 150 - 400 K/uL   Neutrophils Relative % 80 %   Neutro Abs 9.1 (H) 1.7 - 7.7 K/uL   Lymphocytes Relative 11 %   Lymphs Abs 1.2 0.7 - 4.0 K/uL   Monocytes Relative 9 %   Monocytes Absolute 1.0 0.1 - 1.0 K/uL   Eosinophils Relative 1 %   Eosinophils Absolute 0.1 0.0 - 0.7 K/uL    Basophils Relative 1 %  Basophils Absolute 0.1 0.0 - 0.1 K/uL    Comment: Performed at [facility], [place] [address], [place], [place] [address]  Protime-INR     Status: Abnormal   Collection Time: 09/30/17  4:37 PM  Result Value Ref Range   Prothrombin Time 25.6 (H) 11.4 - 15.2 seconds   INR 2.36     Comment: Performed at [facility], [place] [address] [place], [place] [address]  Urinalysis, Routine w reflex microscopic     Status: Abnormal   Collection Time: 09/30/17  4:37 PM  Result Value Ref Range   Color, Urine YELLOW YELLOW   APPearance CLEAR CLEAR   Specific Gravity, Urine 1.023 1.005 - 1.030   pH 5.0 5.0 - 8.0   Glucose, UA NEGATIVE NEGATIVE mg/dL   Hgb urine dipstick NEGATIVE NEGATIVE   Bilirubin Urine NEGATIVE NEGATIVE   Ketones, ur NEGATIVE NEGATIVE mg/dL   Protein, ur NEGATIVE NEGATIVE mg/dL   Nitrite NEGATIVE NEGATIVE   Leukocytes, UA TRACE (A) NEGATIVE   WBC, UA 0-5 0 - 5 WBC/hpf   Bacteria, UA RARE (A) NONE SEEN   Squamous Epithelial / LPF 0-5 0 - 5   Mucus PRESENT    Hyaline Casts, UA PRESENT     Comment: Performed at [facility], [place] [address], [place], [place] [address]  Urine rapid drug screen (hosp performed)     Status: None   Collection Time: 09/30/17  4:37 PM  Result Value Ref Range   Opiates NONE DETECTED NONE DETECTED   Cocaine NONE DETECTED NONE DETECTED   Benzodiazepines NONE DETECTED NONE DETECTED   Amphetamines NONE DETECTED NONE DETECTED   Tetrahydrocannabinol NONE DETECTED NONE DETECTED   Barbiturates NONE DETECTED NONE DETECTED    Comment: (NOTE) DRUG SCREEN FOR MEDICAL PURPOSES ONLY.  IF CONFIRMATION IS NEEDED FOR ANY PURPOSE, NOTIFY LAB WITHIN 5 DAYS. LOWEST DETECTABLE LIMITS FOR URINE DRUG SCREEN Drug Class                     Cutoff (ng/mL) Amphetamine and metabolites    1000 Barbiturate and metabolites    200 Benzodiazepine                 671 Tricyclics and metabolites      300 Opiates and metabolites        300 Cocaine and metabolites        300 THC                            50 Performed at [facility], [place] [address], [place], [place] [address]   I-stat troponin, ED     Status: None   Collection Time: 09/30/17  5:16 PM  Result Value Ref Range   Troponin i, poc 0.01 0.00 - 0.08 ng/mL   Comment 3            Comment: Due to the release kinetics of cTnI, a negative result within the first hours of the onset of symptoms does not rule out myocardial infarction with certainty. If myocardial infarction is still suspected, repeat the test at appropriate intervals.   I-Stat CG4 Lactic Acid, ED     Status: None   Collection Time: 09/30/17  5:17 PM  Result Value Ref Range   Lactic Acid, Venous 1.90 0.5 - 1.9 mmol/L   Dg Chest 2 View  Result Date: 09/30/2017 CLINICAL DATA:  Increasing shortness of  breath. EXAM: CHEST - 2 VIEW COMPARISON:  09/18/2017 FINDINGS: Cardiac silhouette is obscured by significantly enlarging left pleural effusion. No right pleural effusion identified. Right lung appears clear. IMPRESSION: 1. Increase in volume left pleural effusion. Electronically Signed   By: Kerby Moors M.D.   On: 09/30/2017 17:24   Ct Head Wo Contrast  Result Date: 09/30/2017 CLINICAL DATA:  83 year old female with new incontinence. Disoriented and seeing double. Not eating properly. Lung cancer and breast cancer. Initial encounter. EXAM: CT HEAD WITHOUT CONTRAST TECHNIQUE: Contiguous axial images were obtained from the base of the skull through the vertex without intravenous contrast. COMPARISON:  09/05/2017 brain MR. FINDINGS: Brain: Exam is motion degraded. No intracranial hemorrhage or CT evidence of large acute infarct. Chronic microvascular changes. Atrophy. No intracranial mass lesion noted on this unenhanced exam. Vascular: Vascular calcifications Skull: No acute abnormality Sinuses/Orbits: Suspect retroconal mass left orbit, possibly  metastatic disease. Other: Mastoid air cells and middle ear cavities are clear. IMPRESSION: 1. Exam is motion degraded. 2. No intracranial hemorrhage or CT evidence of large acute infarct. Chronic microvascular changes. Atrophy. 3. Suspect retroconal mass left orbit, possibly metastatic disease. Electronically Signed   By: Genia Del M.D.   On: 09/30/2017 17:03    Pending Labs Unresulted Labs (From admission, onward)   None      Vitals/Pain Today's Vitals   09/30/17 1530 09/30/17 1700 09/30/17 1830 09/30/17 1900  BP: (!) 150/83 (!) 132/103 (!) 132/108 (!) 140/121  Pulse: (!) 56 76 88 85  Resp: (!) 26 (!) 30 (!) 22 (!) 24  Temp:      TempSrc:      SpO2: 96% 95% 95% 99%  Weight:      Height:      PainSc: 0-No pain       Isolation Precautions No active isolations  Medications Medications  acetaminophen (TYLENOL) tablet 1,000 mg (has no administration in time range)  acetaminophen (TYLENOL) tablet 650 mg (has no administration in time range)  albuterol (PROVENTIL HFA;VENTOLIN HFA) 108 (90 Base) MCG/ACT inhaler 2 puff (has no administration in time range)  amiodarone (PACERONE) tablet 100 mg (has no administration in time range)  anastrozole (ARIMIDEX) tablet 1 mg (has no administration in time range)  atorvastatin (LIPITOR) tablet 10 mg (has no administration in time range)  sertraline (ZOLOFT) tablet 25 mg (has no administration in time range)  mometasone-formoterol (DULERA) 100-5 MCG/ACT inhaler 2 puff (has no administration in time range)  mirtazapine (REMERON) tablet 15 mg (has no administration in time range)  magnesium oxide (MAG-OX) tablet 400 mg (has no administration in time range)  amLODipine (NORVASC) tablet 5 mg (has no administration in time range)  calcium carbonate (OS-CAL) tablet 600 mg (has no administration in time range)  folic acid (FOLVITE) tablet 400 mcg (has no administration in time range)  furosemide (LASIX) tablet 40 mg (has no administration in time  range)  levothyroxine (SYNTHROID, LEVOTHROID) tablet 125 mcg (has no administration in time range)  losartan (COZAAR) tablet 100 mg (has no administration in time range)  LORazepam (ATIVAN) tablet 0.5 mg (has no administration in time range)  multivitamin tablet 1 tablet (has no administration in time range)  rivaroxaban (XARELTO) tablet 20 mg (has no administration in time range)  spironolactone (ALDACTONE) tablet 25 mg (has no administration in time range)  vitamin B-12 (CYANOCOBALAMIN) tablet 1,000 mcg (has no administration in time range)  ondansetron (ZOFRAN) tablet 4 mg (has no administration in time range)    Or  ondansetron (  ZOFRAN) injection 4 mg (has no administration in time range)    Mobility walks with device

## 2017-09-30 NOTE — ED Notes (Signed)
Patient transported to CT 

## 2017-09-30 NOTE — ED Provider Notes (Signed)
[facility] DEPT Provider Note   CSN: [id number] Arrival date & time: 09/30/17  1441     History   Chief Complaint Chief Complaint  Patient presents with  . Weakness    HPI [name] is a 83 y.o. female.  HPI Patient has breast cancer is being treated by Dr. Lindi Adie.  They have follow-up appointment tomorrow to discuss the patient's ongoing treatment and more [place]-term plan.  Patient and HER-2 sons however reports she is getting much weaker fairly quickly.  Over 2 weeks she has had a lot of decline.  Therefore she can hardly get out of bed now.  Patient has had 2 pleurocentesis.  She reports she starting to feel short of breath again.  She denies chest pain.  No fever.  She is having incontinence of urine.  She also reports fairly frequent stool with some incontinence.  No abdominal pain.  Sons note a little bit of swelling in her legs.  Patient denies she is taking pain medications.  Patient is also been having problems with double vision.  Is been ongoing.  The apparently do not have any home health aide at this time.  These issues are supposed to be getting addressed tomorrow. Past Medical History:  Diagnosis Date  . A-fib ([place])    takes Amiodarone and Xarelto daily  . Allergy history unknown   . Anxiety    takes Ativan daily as needed  . Aortic stenosis    mild 02/2015 (Dr. Terrence Dupont)  . Arthritis   . Breast cancer ([place])   . Breast cancer of lower-inner quadrant of left female breast ([place]) 02/09/2015  . Bruises easily   . COPD (chronic obstructive pulmonary disease) (HCC)    ALdactone and Spirva daily.Dulera daily.Albuterol daily as needed  . COPD, severe   . Depression   . Emphysema (subcutaneous) (surgical) resulting from a procedure   . Fibromyalgia   . Fibromyalgia   . History of bronchitis 2-3 yrs ago  . History of colon polyps    benign  . Hyperlipidemia    takes Atorvastatin daily  . Hypertension    takes Losartan daily  .  Hypothyroidism    takes Synthroid daily  . IBS (irritable bowel syndrome)   . Joint pain   . Osteoarthritis   . Peripheral edema    takes Furosemide and Spirolactone daily  . Peripheral neuropathy   . Pneumonia    hx of-9 yrs ago  . Radiation 05/05/15-07/07/15   left breast 50.4 Gy, left breast boost 14 Gy, left axilla/SCF 50.4 Gy  . Radiation 05/05/15-07/07/15   left breast 50.4 Gy boosted to 14 gy  . Shortness of breath dyspnea    with exertion  . Sinus problem   . Stroke [facility])    affected left leg    Patient Active Problem List   Diagnosis Date Noted  . Squamous cell lung cancer, left ([place]) 09/02/2017  . Pleural effusion 08/24/2017  . Aortic stenosis 04/12/2016  . Multiple lung nodules on CT 11/23/2015  . Breast cancer of lower-inner quadrant of left female breast ([place]) 02/09/2015  . CVA (cerebrovascular accident) ([place]) 03/25/2012  . A-fib ([place]) 03/25/2012  . Bradycardia 08/02/2010  . DYSPNEA 12/01/2009  . HYPOTHYROIDISM 11/01/2009  . HYPERLIPIDEMIA 11/01/2009  . DEPRESSION 11/01/2009  . COPD mixed type ([place]) 11/01/2009  . OSTEOARTHRITIS 11/01/2009  . HYPERTENSION 10/27/2009    Past Surgical History:  Procedure Laterality Date  . APPENDECTOMY    . blephorplasty Bilateral   .  BREAST LUMPECTOMY    . BREAST LUMPECTOMY WITH RADIOACTIVE SEED AND AXILLARY LYMPH NODE DISSECTION Left 03/22/2015   Procedure: BREAST LUMPECTOMY WITH RADIOACTIVE SEED AND AXILLARY LYMPH NODE DISSECTION;  Surgeon: Alphonsa Overall, MD;  Location: [facility];  Service: General;  Laterality: Left;  Left axilla and Lyph node excision  . cataract surgery Bilateral   . CHOLECYSTECTOMY    . COLONOSCOPY    . HEMORRHOID SURGERY    . TONSILLECTOMY    . TUBAL LIGATION    . VIDEO BRONCHOSCOPY WITH ENDOBRONCHIAL NAVIGATION N/A 12/07/2015   Procedure: VIDEO BRONCHOSCOPY WITH ENDOBRONCHIAL NAVIGATION;  Surgeon: Collene Gobble, MD;  Location: [facility];  Service: Thoracic;  Laterality: N/A;     OB History   None       Home Medications    Prior to Admission medications   Medication Sig Start Date End Date Taking? Authorizing Provider  acetaminophen (TYLENOL) 325 MG tablet Take 650 mg by mouth every 4 (four) hours as needed (FOR PAIN.). Reported on 04/20/2015   Yes [provider]  albuterol (PROVENTIL HFA;VENTOLIN HFA) 108 (90 Base) MCG/ACT inhaler Inhale 2 puffs into the lungs every 6 (six) hours as needed for wheezing or shortness of breath.   Yes [provider]  amiodarone (PACERONE) 200 MG tablet Take 100 mg by mouth daily.  07/18/10  Yes [provider]  amLODipine (NORVASC) 5 MG tablet Take 5 mg by mouth daily. 11/24/15  Yes [provider]  anastrozole (ARIMIDEX) 1 MG tablet Take 1 tablet (1 mg total) by mouth daily. 08/15/17  Yes Nicholas Lose, MD  atorvastatin (LIPITOR) 10 MG tablet Take 10 mg by mouth daily.  08/29/14  Yes [provider]  BIOTIN PO Take 1 tablet by mouth daily.   Yes [provider]  furosemide (LASIX) 40 MG tablet Take 40 mg by mouth daily. Reported on 06/28/2015 04/10/11  Yes [provider]  IRON PO Take 1 tablet by mouth daily.   Yes [provider]  levothyroxine (SYNTHROID, LEVOTHROID) 125 MCG tablet Take 1 tablet by mouth daily. 09/27/16  Yes [provider]  LORazepam (ATIVAN) 0.5 MG tablet Take 0.5 mg by mouth 2 (two) times daily as needed for anxiety. 08/19/17  Yes [provider]  losartan (COZAAR) 100 MG tablet Take 100 mg by mouth daily.   Yes [provider]  mirtazapine (REMERON) 15 MG tablet Take 1 tablet (15 mg total) by mouth at bedtime. 09/17/17  Yes Tanner, Lyndon Code., PA-C  mometasone-formoterol (DULERA) 100-5 MCG/ACT AERO Inhale 2 puffs into the lungs 2 (two) times daily. 03/17/13  Yes Young, Tarri Fuller D, MD  rivaroxaban (XARELTO) 20 MG TABS tablet Take 1 tablet (20 mg total) by mouth daily with supper. Please restart this medication on Friday 12/09/15. 12/07/15  Yes Collene Gobble, MD  sertraline (ZOLOFT) 25 MG tablet Take 25 mg by mouth daily. 06/13/17  Yes [provider]  spironolactone (ALDACTONE) 25 MG tablet Take 25 mg by mouth daily.  11/27/14  Yes [provider]  calcium carbonate (OS-CAL) 600 MG TABS tablet Take 600 mg by mouth daily with breakfast. Reported on 08/18/2015    [provider]  folic acid (FOLVITE) 212 MCG tablet Take 400 mcg by mouth daily.    [provider]  magnesium oxide (MAG-OX) 400 MG tablet Take 400 mg by mouth 2 (two) times daily.      [provider]  Multiple Vitamin (MULTIVITAMIN) tablet Take 1 tablet by mouth  daily.    [provider]  tiotropium (SPIRIVA) 18 MCG inhalation capsule Place 1 capsule (18 mcg total) into inhaler and inhale daily. Patient not taking: Reported on 09/30/2017 11/05/12   Deneise Lever, MD  vitamin B-12 (CYANOCOBALAMIN) 1000 MCG tablet Take 1,000 mcg by mouth daily. Reported on 08/18/2015    [provider]  digoxin (LANOXIN) 0.125 MG tablet Take 1 tablet by mouth Daily. 07/18/10 08/27/17  [provider]    Family History Family History  Adopted: Yes    Social History Social History   Tobacco Use  . Smoking status: Former Smoker    Packs/day: 0.50    Last attempt to quit: 02/27/2003    Years since quitting: 14.6  . Smokeless tobacco: Never Used  . Tobacco comment: quit smoking 13 yrs ago  Substance Use Topics  . Alcohol use: Yes    Alcohol/week: 0.0 oz    Comment: occasionally beer  . Drug use: No     Allergies   Codeine; Penicillins; and Silvadene [silver sulfadiazine]   Review of Systems Review of Systems 10 Systems reviewed and are negative for acute change except as noted in the HPI.   Physical Exam Updated Vital Signs BP (!) 140/121   Pulse 85   Temp 98.1 F (36.7 C) (Oral)   Resp (!) 24   Ht 5' 3" (1.6 m)   Wt 60.8 kg (134 lb)   SpO2 99%   BMI 23.74 kg/m   Physical Exam  Constitutional: She is  oriented to person, place, and time.  Patient is alert.  Tachypnea with mild to moderate increased work of breathing at rest.  HENT:  Head: Normocephalic and atraumatic.  Mouth/Throat: Oropharynx is clear and moist.  Eyes: Pupils are equal, round, and reactive to light.  Patient has a disconjugate gaze with right lateral gaze.  Neck: Neck supple.  Cardiovascular:  Heart sounds slightly distant.  No gross murmur.  Soft crackle questionable friction rub versus rails of lung fields.  Pulmonary/Chest:  Mild to moderate increased with breathing.  Patient is speaking in full sentences.  Does not appear overtly distressed.  Breath sounds very soft with fine soft crackles bilaterally lower half of the lung fields.  Abdominal: Soft. She exhibits no distension. There is no tenderness. There is no guarding.  Musculoskeletal: Normal range of motion.  Trace pitting edema bilateral lower extremities.  Calves soft nontender.  Skin condition is good.  Feet are in good condition.  Neurological: She is alert and oriented to person, place, and time.  Patient has disconjugate lateral right gaze.  Other cranial nerves appear intact.  No focal motor deficit.  Skin: Skin is warm and dry.  Psychiatric: She has a normal mood and affect.     ED Treatments / Results  Labs (all labs ordered are listed, but only abnormal results are displayed) Labs Reviewed  COMPREHENSIVE METABOLIC PANEL - Abnormal; Notable for the following components:      Result Value   Chloride 90 (*)    CO2 35 (*)    Glucose, Bld 108 (*)    Albumin 3.3 (*)    All other components within normal limits  CBC WITH DIFFERENTIAL/PLATELET - Abnormal; Notable for the following components:   WBC 11.5 (*)    MCV 106.3 (*)    Platelets 457 (*)    Neutro Abs 9.1 (*)    All other components within normal limits  PROTIME-INR - Abnormal; Notable for the following components:   Prothrombin  Time 25.6 (*)    All other components within normal limits   URINALYSIS, ROUTINE W REFLEX MICROSCOPIC - Abnormal; Notable for the following components:   Leukocytes, UA TRACE (*)    Bacteria, UA RARE (*)    All other components within normal limits  LIPASE, BLOOD  BRAIN NATRIURETIC PEPTIDE  RAPID URINE DRUG SCREEN, HOSP PERFORMED  I-STAT CG4 LACTIC ACID, ED  I-STAT TROPONIN, ED  I-STAT CG4 LACTIC ACID, ED    EKG SR 90 QTc 482 Axis 51 Nonspecific T wave flattening.  Low voltage.  Baseline artifact.  No significant change from previous.  Radiology Dg Chest 2 View  Result Date: 09/30/2017 CLINICAL DATA:  Increasing shortness of breath. EXAM: CHEST - 2 VIEW COMPARISON:  09/18/2017 FINDINGS: Cardiac silhouette is obscured by significantly enlarging left pleural effusion. No right pleural effusion identified. Right lung appears clear. IMPRESSION: 1. Increase in volume left pleural effusion. Electronically Signed   By: Kerby Moors M.D.   On: 09/30/2017 17:24   Ct Head Wo Contrast  Result Date: 09/30/2017 CLINICAL DATA:  83 year old female with new incontinence. Disoriented and seeing double. Not eating properly. Lung cancer and breast cancer. Initial encounter. EXAM: CT HEAD WITHOUT CONTRAST TECHNIQUE: Contiguous axial images were obtained from the base of the skull through the vertex without intravenous contrast. COMPARISON:  09/05/2017 brain MR. FINDINGS: Brain: Exam is motion degraded. No intracranial hemorrhage or CT evidence of large acute infarct. Chronic microvascular changes. Atrophy. No intracranial mass lesion noted on this unenhanced exam. Vascular: Vascular calcifications Skull: No acute abnormality Sinuses/Orbits: Suspect retroconal mass left orbit, possibly metastatic disease. Other: Mastoid air cells and middle ear cavities are clear. IMPRESSION: 1. Exam is motion degraded. 2. No intracranial hemorrhage or CT evidence of large acute infarct. Chronic microvascular changes. Atrophy. 3. Suspect retroconal mass left orbit, possibly metastatic  disease. Electronically Signed   By: Genia Del M.D.   On: 09/30/2017 17:03    Procedures Procedures (including critical care time)  Medications Ordered in ED Medications  acetaminophen (TYLENOL) tablet 1,000 mg (has no administration in time range)     Initial Impression / Assessment and Plan / ED Course  I have reviewed the triage vital signs and the nursing notes.  Pertinent labs & imaging results that were available during my care of the patient were reviewed by me and considered in my medical decision making (see chart for details).    Consult: Reviewed with Dr. Benay Spice.  Recommends admission to hospitalist service and place the patient on Dr. Geralyn Flash list for consultation tomorrow. Consult: Dr. Alcario Drought for admission. Final Clinical Impressions(s) / ED Diagnoses   Final diagnoses:  Generalized weakness  Pleural effusion, malignant  She has known cancer with worsening general condition.  She has become too weak to do usual activities.  She has increasing pleural effusion which is recurrent.  She also has had double vision and CT now shows retroconal mass.  Suspicious for metastatic lesion.  Plan will be for admission for both symptomatic and therapeutic treatment.  ED Discharge Orders    None       Charlesetta Shanks, MD 10/14/17 1422

## 2017-09-30 NOTE — ED Triage Notes (Signed)
Pt has had new incontinence. Pt is more disoriented and seeing double. Pt has not been eating properly per family. Pt has not been taking her medication prescribed. Sons are concerned that she is unable to take care of herself. Pt has lung CA (left sided), and had breast cancer on the same side. Pt called cancer center and told to come to ED>

## 2017-09-30 NOTE — ED Notes (Signed)
Bed: WA20 Expected date:  Expected time:  Means of arrival:  Comments: Cancer center

## 2017-10-01 ENCOUNTER — Telehealth: Payer: Self-pay

## 2017-10-01 ENCOUNTER — Inpatient Hospital Stay: Payer: Medicare Other | Admitting: Hematology and Oncology

## 2017-10-01 ENCOUNTER — Encounter (HOSPITAL_COMMUNITY): Payer: Self-pay | Admitting: Radiology

## 2017-10-01 DIAGNOSIS — M797 Fibromyalgia: Secondary | ICD-10-CM | POA: Diagnosis present

## 2017-10-01 DIAGNOSIS — J90 Pleural effusion, not elsewhere classified: Secondary | ICD-10-CM | POA: Diagnosis not present

## 2017-10-01 DIAGNOSIS — K589 Irritable bowel syndrome without diarrhea: Secondary | ICD-10-CM | POA: Diagnosis present

## 2017-10-01 DIAGNOSIS — Z87891 Personal history of nicotine dependence: Secondary | ICD-10-CM | POA: Diagnosis not present

## 2017-10-01 DIAGNOSIS — Z7951 Long term (current) use of inhaled steroids: Secondary | ICD-10-CM | POA: Diagnosis not present

## 2017-10-01 DIAGNOSIS — J910 Malignant pleural effusion: Secondary | ICD-10-CM | POA: Diagnosis present

## 2017-10-01 DIAGNOSIS — C801 Malignant (primary) neoplasm, unspecified: Secondary | ICD-10-CM

## 2017-10-01 DIAGNOSIS — Z7901 Long term (current) use of anticoagulants: Secondary | ICD-10-CM | POA: Diagnosis not present

## 2017-10-01 DIAGNOSIS — E785 Hyperlipidemia, unspecified: Secondary | ICD-10-CM | POA: Diagnosis present

## 2017-10-01 DIAGNOSIS — Z79899 Other long term (current) drug therapy: Secondary | ICD-10-CM | POA: Diagnosis not present

## 2017-10-01 DIAGNOSIS — I10 Essential (primary) hypertension: Secondary | ICD-10-CM | POA: Diagnosis present

## 2017-10-01 DIAGNOSIS — C3492 Malignant neoplasm of unspecified part of left bronchus or lung: Secondary | ICD-10-CM | POA: Diagnosis present

## 2017-10-01 DIAGNOSIS — I350 Nonrheumatic aortic (valve) stenosis: Secondary | ICD-10-CM | POA: Diagnosis present

## 2017-10-01 DIAGNOSIS — F419 Anxiety disorder, unspecified: Secondary | ICD-10-CM | POA: Diagnosis present

## 2017-10-01 DIAGNOSIS — Z8673 Personal history of transient ischemic attack (TIA), and cerebral infarction without residual deficits: Secondary | ICD-10-CM | POA: Diagnosis not present

## 2017-10-01 DIAGNOSIS — C7989 Secondary malignant neoplasm of other specified sites: Secondary | ICD-10-CM

## 2017-10-01 DIAGNOSIS — R32 Unspecified urinary incontinence: Secondary | ICD-10-CM | POA: Diagnosis not present

## 2017-10-01 DIAGNOSIS — F329 Major depressive disorder, single episode, unspecified: Secondary | ICD-10-CM | POA: Diagnosis present

## 2017-10-01 DIAGNOSIS — Z79811 Long term (current) use of aromatase inhibitors: Secondary | ICD-10-CM | POA: Diagnosis not present

## 2017-10-01 DIAGNOSIS — Z79890 Hormone replacement therapy (postmenopausal): Secondary | ICD-10-CM | POA: Diagnosis not present

## 2017-10-01 DIAGNOSIS — Z66 Do not resuscitate: Secondary | ICD-10-CM | POA: Diagnosis not present

## 2017-10-01 DIAGNOSIS — H532 Diplopia: Secondary | ICD-10-CM | POA: Diagnosis present

## 2017-10-01 DIAGNOSIS — J449 Chronic obstructive pulmonary disease, unspecified: Secondary | ICD-10-CM | POA: Diagnosis present

## 2017-10-01 DIAGNOSIS — E039 Hypothyroidism, unspecified: Secondary | ICD-10-CM | POA: Diagnosis present

## 2017-10-01 DIAGNOSIS — Z853 Personal history of malignant neoplasm of breast: Secondary | ICD-10-CM | POA: Diagnosis not present

## 2017-10-01 DIAGNOSIS — I4891 Unspecified atrial fibrillation: Secondary | ICD-10-CM | POA: Diagnosis present

## 2017-10-01 DIAGNOSIS — R627 Adult failure to thrive: Secondary | ICD-10-CM | POA: Diagnosis present

## 2017-10-01 LAB — PROTIME-INR
INR: 2.63
PROTHROMBIN TIME: 27.9 s — AB (ref 11.4–15.2)

## 2017-10-01 MED ORDER — LOPERAMIDE HCL 2 MG PO CAPS
2.0000 mg | ORAL_CAPSULE | ORAL | Status: DC | PRN
  Administered 2017-10-01: 2 mg via ORAL
  Filled 2017-10-01: qty 1

## 2017-10-01 MED ORDER — VANCOMYCIN HCL IN DEXTROSE 1-5 GM/200ML-% IV SOLN
1000.0000 mg | INTRAVENOUS | Status: AC
  Administered 2017-10-02: 1000 mg via INTRAVENOUS
  Filled 2017-10-01: qty 200

## 2017-10-01 NOTE — Progress Notes (Signed)
Hem-Onc FM:[id number] SOB, Failure to thrive Exam: Vitals:   10/01/17 1659 10/01/17 1718  BP: (!) 149/74   Pulse: 64   Resp: 17   Temp: 97.6 F (36.4 C)   SpO2: 97% 96%   Lungs: Dim BS Heart: Irreg Abd: Distended Ext: No edema Neuro: Intact  CBC Latest Ref Rng & Units 09/30/2017 09/17/2017 08/25/2017  WBC 4.0 - 10.5 K/uL 11.5(H) 10.8(H) 9.4  Hemoglobin 12.0 - 15.0 g/dL 13.7 13.4 13.3  Hematocrit 36.0 - 46.0 % 44.0 42.0 42.1  Platelets 150 - 400 K/uL 457(H) 360 411(H)   CMP Latest Ref Rng & Units 09/30/2017 09/17/2017 08/25/2017  Glucose 70 - 99 mg/dL 108(H) 102(H) 107(H)  BUN 8 - 23 mg/dL 22 26(H) 24(H)  Creatinine 0.44 - 1.00 mg/dL 0.75 0.85 1.07(H)  Sodium 135 - 145 mmol/L 139 137 135  Potassium 3.5 - 5.1 mmol/L 3.9 3.7 4.3  Chloride 98 - 111 mmol/L 90(L) 91(L) 91(L)  CO2 22 - 32 mmol/L 35(H) 33(H) 32  Calcium 8.9 - 10.3 mg/dL 9.0 9.6 9.1  Total Protein 6.5 - 8.1 g/dL 6.9 7.4 7.0  Total Bilirubin 0.3 - 1.2 mg/dL 0.4 0.3 0.6  Alkaline Phos 38 - 126 U/L 58 60 53  AST 15 - 41 U/L 23 25 35  ALT 0 - 44 U/L 12 13 19    PLAN:  I spoke with the patients 2 sons separately and then also with the patient along with her sons. 1. Met Squamous cell cancer with Malignant pleural effusion and Met behind the eye: Rapidly progressing loss of performance status. PDL-1: 0% (No role of immunotherapy) Patient is frail for chemo Essentially there are no systemic treatment options.  Recommendation: Hospice care Agree with Pleurex catheter for comfort 2. Hospice consult: Patient might benefit from In-patient hospice. Will leave that discussion to the hospice team and the patient and her family.  3. All non comfort care meds can be discontinued including Xarelto Spent 60 mins in counselling and guiding the goals of care

## 2017-10-01 NOTE — Progress Notes (Signed)
PROGRESS NOTE    [name]  ERD:[id number] DOB: [birth year] DOA: 09/30/2017 PCP: [name], MD     Brief Narrative:  [name] is a 83 y.o. female with medical history significant of BRCA in past, a.fib, COPD, HTN.  Patient has been having trouble with recurrent pleural effusion drained twice over past month.  Cytology on drainage 6/30 appears c/w NSCLC (squamous cell) carcinoma of lung.  Also has h/o multiple pulm nodules. Over last 2 weeks, she has had a lot of SOB.  Therefore she can hardly get out of bed now.  She is having incontinence of urine. She also reports fairly frequent stool with some incontinence and takes imodium at home. No abdominal pain.  In the emergency department, she was found to have recurrent left pleural effusion.  EDP discussed with oncology on-call  for consultation.  New events last 24 hours / Subjective: No acute events.  Has had 2 loose bowel movements, which is chronic for her.  She denies any abdominal pain.  Assessment & Plan:   Principal Problem:   Recurrent pleural effusion on left Active Problems:   Essential hypertension   COPD mixed type (HCC)   DYSPNEA   A-fib (HCC)   Squamous cell lung cancer, left (HCC)   Diplopia   Pleural effusion, malignant   Recurrent malignant left-sided pleural effusion -CXR independently reviewed, reveals reaccumulated pleural effusion on left  -Consulted IR for thoracentesis versus Pleurx catheter placement, awaiting oncology recommendation  -Dr. Lindi Adie consulted by EDP   Diplopia -CT head showed suspect retroconal mass left orbit, possibly metastatic disease  Atrial fibrillation -Continue amiodarone, hold Xarelto pending IR procedure  Essential hypertension -Continue Norvasc, cozaar   HLD -Continue lipitor   Hypothyroidism -Continue synthroid   COPD -Continue home inhaler treatments  Depression -Continue zoloft  Chronic diarrhea -Continue home Imodium, no signs of infectious etiology,  afebrile without abdominal pain   DVT prophylaxis: Xarelto pending IR consultation Code Status: Full code Family Communication: Sons at bedside Disposition Plan: Pending improvement, oncology and IR consultations  It is my clinical opinion that admission to INPATIENT is reasonable and necessary in this 83 y.o. year old female   presenting with symptoms of shortness of breath, concerning for recurrent pleural effusion  in the context of PMH including: Non-small cell lung cancer  with pertinent positives on physical exam including: Diminished breath sounds on left  and pertinent positives on radiographic and laboratory data including: Left-sided pleural effusion  Workup and treatment include IR, oncology consultation for thoracentesis versus Pleurx catheter placement.  Given the aforementioned, the predictability of an adverse outcome is felt to be significant. I expect that the patient will require at least 2 midnights in the hospital to treat this condition.   Consultants:   Oncology  IR  Procedures:   None  Antimicrobials:  Anti-infectives (From admission, onward)   None        Objective: Vitals:   09/30/17 2246 10/01/17 0610 10/01/17 0641 10/01/17 0752  BP:   (!) 164/84   Pulse:   69   Resp:   16   Temp:   97.7 F (36.5 C)   TempSrc:   Oral   SpO2: 94% 90% 96% 93%  Weight:      Height:       No intake or output data in the 24 hours ending 10/01/17 1140 Filed Weights   09/30/17 1458  Weight: 60.8 kg (134 lb)    Examination:  General exam: Appears calm  and comfortable  Respiratory system: Diminished breath sounds left base.  Nasal cannula O2.  Respiratory effort normal. Cardiovascular system: S1 & S2 heard. No JVD, murmurs, rubs, gallops or clicks. No pedal edema. Gastrointestinal system: Abdomen is nondistended, soft and nontender. No organomegaly or masses felt. Normal bowel sounds heard. Central nervous system: Alert and oriented. No focal  neurological deficits. Extremities: Symmetric 5 x 5 power. Skin: No rashes, lesions or ulcers Psychiatry: Judgement and insight appear normal. Mood & affect appropriate.   Data Reviewed: I have personally reviewed following labs and imaging studies  CBC: Recent Labs  Lab 09/30/17 1637  WBC 11.5*  NEUTROABS 9.1*  HGB 13.7  HCT 44.0  MCV 106.3*  PLT 742*   Basic Metabolic Panel: Recent Labs  Lab 09/30/17 1637  NA 139  K 3.9  CL 90*  CO2 35*  GLUCOSE 108*  BUN 22  CREATININE 0.75  CALCIUM 9.0   GFR: Estimated Creatinine Clearance: 45.6 mL/min (by C-G formula based on SCr of 0.75 mg/dL). Liver Function Tests: Recent Labs  Lab 09/30/17 1637  AST 23  ALT 12  ALKPHOS 58  BILITOT 0.4  PROT 6.9  ALBUMIN 3.3*   Recent Labs  Lab 09/30/17 1637  LIPASE 21   No results for input(s): AMMONIA in the last 168 hours. Coagulation Profile: Recent Labs  Lab 09/30/17 1637 10/01/17 0800  INR 2.36 2.63   Cardiac Enzymes: No results for input(s): CKTOTAL, CKMB, CKMBINDEX, TROPONINI in the last 168 hours. BNP (last 3 results) Recent Labs    08/22/17 1716  PROBNP 60.0   HbA1C: No results for input(s): HGBA1C in the last 72 hours. CBG: No results for input(s): GLUCAP in the last 168 hours. Lipid Profile: No results for input(s): CHOL, HDL, LDLCALC, TRIG, CHOLHDL, LDLDIRECT in the last 72 hours. Thyroid Function Tests: No results for input(s): TSH, T4TOTAL, FREET4, T3FREE, THYROIDAB in the last 72 hours. Anemia Panel: No results for input(s): VITAMINB12, FOLATE, FERRITIN, TIBC, IRON, RETICCTPCT in the last 72 hours. Sepsis Labs: Recent Labs  Lab 09/30/17 1717  LATICACIDVEN 1.90    No results found for this or any previous visit (from the past 240 hour(s)).     Radiology Studies: Dg Chest 2 View  Result Date: 09/30/2017 CLINICAL DATA:  Increasing shortness of breath. EXAM: CHEST - 2 VIEW COMPARISON:  09/18/2017 FINDINGS: Cardiac silhouette is obscured by  significantly enlarging left pleural effusion. No right pleural effusion identified. Right lung appears clear. IMPRESSION: 1. Increase in volume left pleural effusion. Electronically Signed   By: Kerby Moors M.D.   On: 09/30/2017 17:24   Ct Head Wo Contrast  Result Date: 09/30/2017 CLINICAL DATA:  83 year old female with new incontinence. Disoriented and seeing double. Not eating properly. Lung cancer and breast cancer. Initial encounter. EXAM: CT HEAD WITHOUT CONTRAST TECHNIQUE: Contiguous axial images were obtained from the base of the skull through the vertex without intravenous contrast. COMPARISON:  09/05/2017 brain MR. FINDINGS: Brain: Exam is motion degraded. No intracranial hemorrhage or CT evidence of large acute infarct. Chronic microvascular changes. Atrophy. No intracranial mass lesion noted on this unenhanced exam. Vascular: Vascular calcifications Skull: No acute abnormality Sinuses/Orbits: Suspect retroconal mass left orbit, possibly metastatic disease. Other: Mastoid air cells and middle ear cavities are clear. IMPRESSION: 1. Exam is motion degraded. 2. No intracranial hemorrhage or CT evidence of large acute infarct. Chronic microvascular changes. Atrophy. 3. Suspect retroconal mass left orbit, possibly metastatic disease. Electronically Signed   By: Alcide Evener.D.  On: 09/30/2017 17:03      Scheduled Meds: . amiodarone  100 mg Oral Daily  . amLODipine  5 mg Oral Daily  . anastrozole  1 mg Oral Daily  . atorvastatin  10 mg Oral Daily  . calcium carbonate  1 tablet Oral Q breakfast  . feeding supplement (ENSURE ENLIVE)  237 mL Oral BID BM  . folic acid  0.5 mg Oral Daily  . furosemide  40 mg Oral Daily  . levothyroxine  125 mcg Oral QAC breakfast  . losartan  100 mg Oral Daily  . magnesium oxide  400 mg Oral BID  . mirtazapine  15 mg Oral QHS  . mometasone-formoterol  2 puff Inhalation BID  . multivitamin with minerals  1 tablet Oral Daily  . rivaroxaban  20 mg Oral Q  supper  . sertraline  25 mg Oral Daily  . spironolactone  25 mg Oral Daily  . vitamin B-12  1,000 mcg Oral Daily   Continuous Infusions:   LOS: 0 days    Time spent: 35 minutes   Dessa Phi, DO [facility] [email or web address] Password [email or web address] 10/01/2017, 11:40 AM

## 2017-10-01 NOTE — Progress Notes (Signed)
IR aware of request for possible (L)PleurX catheter for recurrent malignant effusion. Pt on Xarelto with INR noted to be 2.63. Will need to hold Xarelto at least one day. Would also like to have input from Oncology to confirm they are on board with this plan. IR following.  Ascencion Dike PA-C Interventional Radiology 10/01/2017 9:44 AM

## 2017-10-01 NOTE — Progress Notes (Signed)
Initial Nutrition Assessment  INTERVENTION:   -Continue Ensure Enlive po BID, each supplement provides 350 kcal and 20 grams of protein -Provide Magic cup TID with meals, each supplement provides 290 kcal and 9 grams of protein  NUTRITION DIAGNOSIS:   Increased nutrient needs related to cancer and cancer related treatments as evidenced by estimated needs.  GOAL:   Patient will meet greater than or equal to 90% of their needs  MONITOR:   PO intake, Supplement acceptance, Labs, Weight trends, I & O's  REASON FOR ASSESSMENT:   Malnutrition Screening Tool   ASSESSMENT:   83 y.o. female with medical history significant of BRCA in past, a.fib, COPD, HTN.  Patient has been having trouble with recurrent pleural effusion drained twice over past month.  Cytology on drainage 6/30 appears c/w NSCLC (squamous cell) carcinoma of lung.  Also has h/o multiple pulm nodules.  Pt in room with family at bedside. Pt sleeping and family states she sleeps a lot. Family reports about 2 weeks ago, pt had some fluid removed from her lungs and her appetite bounced back from that plus being prescribed Remeron which increased her appetite. Since Friday 8/2, pt started to eat less and fluid began to build-up again. Pt ate 1/4 of a sandwich on 8/4 per family. Yesterday, pt stated to family she ate a whole hot dog but was only witnessed to eat 2 bites.  Pt ate 1/2 a biscuit this morning. Has an Ensure supplement at bedside, unopened. Encouraged family to get patient to drink supplements for added protein and calories.  Family states they have encouraged pt to limit salt intake but pt still wants hot dogs, bacon, sausage, etc. RD feels that if patient is not eating well, she can have these items. Pt is on a regular diet. Discussed this with family.  Per chart review, pt has lost 10 lb since August 2018 (7% wt loss x 1 year, insignificant for time frame). Pt's family reports increased weakness in arms and legs.    Labs reviewed. Medications: OSCAL daily, Folic acid tablet daily, Lasix tablet daily, MAG-Ox tablet BID, Remeron tablet daily, Multivitamin with minerals daily, Vitamin B-12 tablet daily  NUTRITION - FOCUSED PHYSICAL EXAM:  Deferred as patient was sleeping and family didn't want disturbed.  Diet Order:   Diet Order           Diet regular Room service appropriate? Yes; Fluid consistency: Thin  Diet effective now          EDUCATION NEEDS:   Not appropriate for education at this time  Skin:  Skin Assessment: Reviewed RN Assessment  Last BM:  8/6  Height:   Ht Readings from Last 1 Encounters:  09/30/17 5' 3"  (1.6 m)    Weight:   Wt Readings from Last 1 Encounters:  09/30/17 134 lb (60.8 kg)    Ideal Body Weight:  52.3 kg  BMI:  Body mass index is 23.74 kg/m.  Estimated Nutritional Needs:   Kcal:  1850-2050  Protein:  85-95g  Fluid:  1.8L/day  Clayton Bibles, MS, RD, LDN [place] Dietitian Pager: [phone] After Hours Pager: [phone]

## 2017-10-01 NOTE — Telephone Encounter (Signed)
Returned call from [facility] Radiology.  Addendum faxed for Dr. Lindi Adie to review from their department.

## 2017-10-01 NOTE — Assessment & Plan Note (Deleted)
Left lumpectomy 03/22/2015: Invasive and in situ lobular carcinoma grade 2, 6 cm, LVI present, posterior lateral and anterior margins broadly involved, 12/12 LN positive with ECE, ER 95%, PR 60%, HER-2 neg ratio 1.31, Ki-67 15%, T3 N3 (stage 3C) Adjuvant radiation therapy 05/06/2015 - 07/07/2015 Left Breast: 50.4 Gy in 28 fractions Left Breast Boost: 14 Gy in 7 fractions (64.4) Left Axilla/SCF: 50.4 Gy in 28 fractions CT chest 11/10/2015: Increase in the number and size of numerous nodular opacities in the lungs bilaterally largest left lower lobe 13 x 28 mm, right lower lobe 18 x 16 mm, no lymphadenopathy Biopsy lung 12/07/2015: Right lower lobe, right middle lobe, left lower lobe: Benign lung tissue with reactive bronchial epithelium  Poorly differentiated squamous cell lung cancer: Hospitalization 08/23/2017: Large left pleural effusion.  Status post a thoracentesis Pathology review: Consistent with poorly differentiated squamous cell carcinoma  PET CT scan 09/06/2017: Metastatic  cancer with multiple sites in the lung, moderate left effusion. Brain MRI 09/05/2017: No metastatic disease  Patient is not in any good shape to tolerate systemic chemotherapy.  However she may be able to tolerate immunotherapy if her PDL 1 is greater than 1%.  I will refer the patient to Dr. Julien Nordmann for further evaluation and treatment options.

## 2017-10-01 NOTE — Progress Notes (Signed)
Chief Complaint: Patient was seen in consultation today for tunneled pleural catheter at the request of Dr. Jennette Kettle  Referring Physician(s): Dr. Alcario Drought  Supervising Physician: Sandi Mariscal  Patient Status: [facility] - In-pt  History of Present Illness: [name] is a 83 y.o. female with hx of breast cancer. She more recently developed left pleural effusion which was tapped. The cytology was positive and consistent with poorly differentiated squamous cell carcinoma. She has since had a few more thoracentesis of over 800 mL each. She has now be admitted with SOB and imaging finds recurrent large left pleural effusion. IR is asked to eval for consideration of PleurX catheter. Chart, meds, labs, imaging reviewed. Hx of A. Fib, on Xarelto, has since been held since admission. Sons at bedside.  Past Medical History:  Diagnosis Date  . A-fib ([place])    takes Amiodarone and Xarelto daily  . Allergy history unknown   . Anxiety    takes Ativan daily as needed  . Aortic stenosis    mild 02/2015 (Dr. Terrence Dupont)  . Arthritis   . Breast cancer ([place])   . Breast cancer of lower-inner quadrant of left female breast ([place]) 02/09/2015  . Bruises easily   . COPD (chronic obstructive pulmonary disease) (H[facility])    ALdactone and Spirva daily.Dulera daily.Albuterol daily as needed  . COPD, severe   . Depression   . Emphysema (subcutaneous) (surgical) resulting from a procedure   . Fibromyalgia   . Fibromyalgia   . History of bronchitis 2-3 yrs ago  . History of colon polyps    benign  . Hyperlipidemia    takes Atorvastatin daily  . Hypertension    takes Losartan daily  . Hypothyroidism    takes Synthroid daily  . IBS (irritable bowel syndrome)   . Joint pain   . Osteoarthritis   . Peripheral edema    takes Furosemide and Spirolactone daily  . Peripheral neuropathy   . Pneumonia    hx of-9 yrs ago  . Radiation 05/05/15-07/07/15   left breast 50.4 Gy, left breast boost 14 Gy, left  axilla/SCF 50.4 Gy  . Radiation 05/05/15-07/07/15   left breast 50.4 Gy boosted to 14 gy  . Shortness of breath dyspnea    with exertion  . Sinus problem   . Stroke [facility])    affected left leg    Past Surgical History:  Procedure Laterality Date  . APPENDECTOMY    . blephorplasty Bilateral   . BREAST LUMPECTOMY    . BREAST LUMPECTOMY WITH RADIOACTIVE SEED AND AXILLARY LYMPH NODE DISSECTION Left 03/22/2015   Procedure: BREAST LUMPECTOMY WITH RADIOACTIVE SEED AND AXILLARY LYMPH NODE DISSECTION;  Surgeon: Alphonsa Overall, MD;  Location: [place];  Service: General;  Laterality: Left;  Left axilla and Lyph node excision  . cataract surgery Bilateral   . CHOLECYSTECTOMY    . COLONOSCOPY    . HEMORRHOID SURGERY    . TONSILLECTOMY    . TUBAL LIGATION    . VIDEO BRONCHOSCOPY WITH ENDOBRONCHIAL NAVIGATION N/A 12/07/2015   Procedure: VIDEO BRONCHOSCOPY WITH ENDOBRONCHIAL NAVIGATION;  Surgeon: Collene Gobble, MD;  Location: [place] OR;  Service: Thoracic;  Laterality: N/A;    Allergies: Codeine; Penicillins; and Silvadene [silver sulfadiazine]  Medications:  Current Facility-Administered Medications:  .  acetaminophen (TYLENOL) tablet 650 mg, 650 mg, Oral, Q4H PRN, Etta Quill, DO, 650 mg at 10/01/17 0604 .  albuterol (PROVENTIL) (2.5 MG/3ML) 0.083% nebulizer solution 2.5 mg, 2.5 mg, Nebulization, Q6H PRN, Jennette Kettle  M, DO, 2.5 mg at 10/01/17 0610 .  amiodarone (PACERONE) tablet 100 mg, 100 mg, Oral, Daily, Jennette Kettle M, DO, 100 mg at 10/01/17 1017 .  amLODipine (NORVASC) tablet 5 mg, 5 mg, Oral, Daily, Alcario Drought, Jared M, DO, 5 mg at 10/01/17 1010 .  anastrozole (ARIMIDEX) tablet 1 mg, 1 mg, Oral, Daily, Alcario Drought, Jared M, DO, 1 mg at 10/01/17 1010 .  atorvastatin (LIPITOR) tablet 10 mg, 10 mg, Oral, Daily, Alcario Drought, Jared M, DO, 10 mg at 10/01/17 1010 .  calcium carbonate (OS-CAL - dosed in mg of elemental calcium) tablet 500 mg of elemental calcium, 1 tablet, Oral, Q breakfast, Alcario Drought, Jared  M, DO, 500 mg of elemental calcium at 10/01/17 0806 .  feeding supplement (ENSURE ENLIVE) (ENSURE ENLIVE) liquid 237 mL, 237 mL, Oral, BID BM, Gardner, Jared M, DO, 237 mL at 10/01/17 1009 .  folic acid (FOLVITE) tablet 0.5 mg, 0.5 mg, Oral, Daily, Alcario Drought, Jared M, DO, 0.5 mg at 10/01/17 1011 .  furosemide (LASIX) tablet 40 mg, 40 mg, Oral, Daily, Jennette Kettle M, DO, 40 mg at 10/01/17 1010 .  levothyroxine (SYNTHROID, LEVOTHROID) tablet 125 mcg, 125 mcg, Oral, QAC breakfast, Etta Quill, DO, 125 mcg at 10/01/17 0947 .  loperamide (IMODIUM) capsule 2 mg, 2 mg, Oral, PRN, Dessa Phi, DO, 2 mg at 10/01/17 1017 .  LORazepam (ATIVAN) tablet 0.5 mg, 0.5 mg, Oral, BID PRN, Etta Quill, DO, 0.5 mg at 10/01/17 0962 .  losartan (COZAAR) tablet 100 mg, 100 mg, Oral, Daily, Jennette Kettle M, DO, 100 mg at 10/01/17 1010 .  magnesium oxide (MAG-OX) tablet 400 mg, 400 mg, Oral, BID, Jennette Kettle M, DO, 400 mg at 10/01/17 1010 .  mirtazapine (REMERON) tablet 15 mg, 15 mg, Oral, QHS, Gardner, Jared M, DO, 15 mg at 09/30/17 2234 .  mometasone-formoterol (DULERA) 100-5 MCG/ACT inhaler 2 puff, 2 puff, Inhalation, BID, Etta Quill, DO, 2 puff at 10/01/17 [phone] .  multivitamin with minerals tablet 1 tablet, 1 tablet, Oral, Daily, Jennette Kettle M, DO, 1 tablet at 10/01/17 1010 .  ondansetron (ZOFRAN) tablet 4 mg, 4 mg, Oral, Q6H PRN **OR** ondansetron (ZOFRAN) injection 4 mg, 4 mg, Intravenous, Q6H PRN, Alcario Drought, Jared M, DO .  sertraline (ZOLOFT) tablet 25 mg, 25 mg, Oral, Daily, Alcario Drought, Jared M, DO, 25 mg at 10/01/17 1011 .  spironolactone (ALDACTONE) tablet 25 mg, 25 mg, Oral, Daily, Alcario Drought, Jared M, DO, 25 mg at 10/01/17 1010 .  [START ON 10/02/2017] vancomycin (VANCOCIN) IVPB 1000 mg/200 mL premix, 1,000 mg, Intravenous, On Call, Chancey Cullinane, PA-C .  vitamin B-12 (CYANOCOBALAMIN) tablet 1,000 mcg, 1,000 mcg, Oral, Daily, Alcario Drought, Jared M, DO, 1,000 mcg at 10/01/17 1010    Family History    Adopted: Yes    Social History   Socioeconomic History  . Marital status: Single    Spouse name: Not on file  . Number of children: 3  . Years of education: Not on file  . Highest education level: Not on file  Occupational History  . Occupation: retired  Scientific laboratory technician  . Financial resource strain: Somewhat hard  . Food insecurity:    Worry: Never true    Inability: Never true  . Transportation needs:    Medical: No    Non-medical: No  Tobacco Use  . Smoking status: Former Smoker    Packs/day: 0.50    Types: Cigarettes    Last attempt to quit: 02/27/2003    Years since quitting: 14.6  .  Smokeless tobacco: Never Used  . Tobacco comment: quit smoking 13 yrs ago  Substance and Sexual Activity  . Alcohol use: Yes    Alcohol/week: 0.0 oz    Comment: occasionally beer  . Drug use: No  . Sexual activity: Not Currently    Partners: Male    Birth control/protection: None  Lifestyle  . Physical activity:    Days per week: 0 days    Minutes per session: 0 min  . Stress: Rather much  Relationships  . Social connections:    Talks on phone: More than three times a week    Gets together: More than three times a week    Attends religious service: 1 to 4 times per year    Active member of club or organization: No    Attends meetings of clubs or organizations: Never    Relationship status: Patient refused  Other Topics Concern  . Not on file  Social History Narrative  . Not on file     Review of Systems: A 12 point ROS discussed and pertinent positives are indicated in the HPI above.  All other systems are negative.  Review of Systems  Vital Signs: BP (!) 164/84 (BP Location: Right Arm)   Pulse 69   Temp 97.7 F (36.5 C) (Oral)   Resp 16   Ht 5\' 3"  (1.6 m)   Wt 134 lb (60.8 kg)   SpO2 93%   BMI 23.74 kg/m   Physical Exam  Constitutional: She is oriented to person, place, and time. She appears well-developed. No distress.  HENT:  Head: Normocephalic.   Mouth/Throat: Oropharynx is clear and moist.  Neck: Normal range of motion. No JVD present. No tracheal deviation present.  Cardiovascular: Normal heart sounds.  Irreg, Irreg  Pulmonary/Chest: Effort normal and breath sounds normal.  Diminished (L)BS  Neurological: She is alert and oriented to person, place, and time.  Psychiatric: She has a normal mood and affect.      Imaging: Dg Chest 1 View  Result Date: 09/18/2017 CLINICAL DATA:  Status post thoracentesis EXAM: CHEST  1 VIEW COMPARISON:  09/17/2017 FINDINGS: Cardiac shadow is stable. Significant reduction in left pleural effusion is noted with only a small residual effusion identified. No pneumothorax is noted. The right lung is well aerated. IMPRESSION: No pneumothorax following left thoracentesis. Electronically Signed   By: Inez Catalina M.D.   On: 09/18/2017 14:48   Dg Chest 2 View  Result Date: 09/30/2017 CLINICAL DATA:  Increasing shortness of breath. EXAM: CHEST - 2 VIEW COMPARISON:  09/18/2017 FINDINGS: Cardiac silhouette is obscured by significantly enlarging left pleural effusion. No right pleural effusion identified. Right lung appears clear. IMPRESSION: 1. Increase in volume left pleural effusion. Electronically Signed   By: Kerby Moors M.D.   On: 09/30/2017 17:24   Ct Head Wo Contrast  Result Date: 09/30/2017 CLINICAL DATA:  83 year old female with new incontinence. Disoriented and seeing double. Not eating properly. Lung cancer and breast cancer. Initial encounter. EXAM: CT HEAD WITHOUT CONTRAST TECHNIQUE: Contiguous axial images were obtained from the base of the skull through the vertex without intravenous contrast. COMPARISON:  09/05/2017 brain MR. FINDINGS: Brain: Exam is motion degraded. No intracranial hemorrhage or CT evidence of large acute infarct. Chronic microvascular changes. Atrophy. No intracranial mass lesion noted on this unenhanced exam. Vascular: Vascular calcifications Skull: No acute abnormality  Sinuses/Orbits: Suspect retroconal mass left orbit, possibly metastatic disease. Other: Mastoid air cells and middle ear cavities are clear. IMPRESSION: 1. Exam is  motion degraded. 2. No intracranial hemorrhage or CT evidence of large acute infarct. Chronic microvascular changes. Atrophy. 3. Suspect retroconal mass left orbit, possibly metastatic disease. Electronically Signed   By: Genia Del M.D.   On: 09/30/2017 17:03   Mr Jeri Cos BJ Contrast  Addendum Date: 09/30/2017   ADDENDUM REPORT: 09/30/2017 17:09 ADDENDUM: The study was reviewed and correlated with the CT of 09/30/2017. There is a subtle intraconal lesion of the LEFT orbit, which appears to have progressed over the 1 month interval. Concern for metastatic disease is raised. These results will be called to the ordering clinician or representative by the Radiologist Assistant, and communication documented in the PACS or zVision Dashboard. Electronically Signed   By: Staci Righter M.D.   On: 09/30/2017 17:09   Result Date: 09/30/2017 CLINICAL DATA:  Diplopia.  Lung cancer.  Possible brain metastases. EXAM: MRI HEAD WITHOUT AND WITH CONTRAST TECHNIQUE: Multiplanar, multiecho pulse sequences of the brain and surrounding structures were obtained without and with intravenous contrast. CONTRAST:  54mL MULTIHANCE GADOBENATE DIMEGLUMINE 529 MG/ML IV SOLN COMPARISON:  MR brain 03/25/2012. FINDINGS: Brain: No evidence for acute infarction, hemorrhage, mass lesion, hydrocephalus, or extra-axial fluid. Generalized atrophy. Moderate T2 and FLAIR hyperintensities throughout the white matter, likely small vessel disease. Multiple chronic lacunar infarcts. Post infusion, no abnormal enhancement of the brain or meninges. Vascular: Flow voids are maintained throughout the carotid, basilar, and vertebral arteries. There are no areas of chronic hemorrhage. Skull and upper cervical spine: Unremarkable visualized calvarium, skullbase, and cervical vertebrae. Pituitary,  pineal, cerebellar tonsils unremarkable. No upper cervical cord lesions. Sinuses/Orbits: No orbital masses or proptosis. Globes appear symmetric. Sinuses appear well aerated, without evidence for air-fluid level. BILATERAL cataract extraction. Other: None. IMPRESSION: Atrophy and small vessel disease. No acute intracranial findings. No cause is seen for the reported symptoms. No visible intracranial metastatic disease or visible orbital abnormality. Electronically Signed: By: Staci Righter M.D. On: 09/05/2017 09:52   Dg Chest Left Decubitus  Result Date: 09/17/2017 CLINICAL DATA:  History of left pleural effusion. EXAM: CHEST - LEFT DECUBITUS COMPARISON:  CT 09/06/2017.  Chest x-ray 08/27/2017. FINDINGS: A left-side-down decubitus chest x-ray obtained. A layering moderate size pleural effusion is noted. IMPRESSION: Layering moderate size pleural effusion. Electronically Signed   By: Marcello Moores  Register   On: 09/17/2017 12:24   Nm Pet Image Initial (pi) Skull Base To Thigh  Result Date: 09/12/2017 CLINICAL DATA:  Subsequent treatment strategy for lung carcinoma. EXAM: NUCLEAR MEDICINE PET SKULL BASE TO THIGH TECHNIQUE: 6.9 mCi F-18 FDG was injected intravenously. Full-ring PET imaging was performed from the skull base to thigh after the radiotracer. CT data was obtained and used for attenuation correction and anatomic localization. Fasting blood glucose: 96 mg/dl COMPARISON:  11/29/2015 FINDINGS: Mediastinal blood pool activity: SUV max 2.34 NECK: No hypermetabolic lymph nodes in the neck. Incidental CT findings: none CHEST: Hypermetabolic nodular thickening along the LEFT upper mediastinal/pleural surface. Example hypermetabolic prevascular lymph node measuring 11 mm with SUV max equal 8.7. More superior nodule along the pleural surface/mediastinum with SUV max equal 14.2. LEFT apical nodule within the pulmonary parenchyma SUV max equal 7.4 measures 11 mm (image 52/4). More anteriorly subpleural nodule with SUV  max equal 7.0. Hypermetabolic subpleural nodularity anteriorly within LEFT upper lobe with SUV max equal 4.5 on image 66/4. There is moderate hypermetabolic activity dependently within the LEFT lower lobe effusion with SUV max equal 4.8. Laterally within the LEFT upper lobe along the pleural surface metastatic lesion on  image 48 with SUV max equal 5.5 No hypermetabolic nodules in RIGHT lung. Incidental CT findings: Moderate LEFT pleural effusion. ABDOMEN/PELVIS: No abnormal hypermetabolic activity within the liver, pancreas, adrenal glands, or spleen. No hypermetabolic lymph nodes in the abdomen or pelvis. Intense metabolic activity associated with the bowel is physiologic Incidental CT findings: Uterus normal. Diverticulosis sigmoid colonnone SKELETON: No focal hypermetabolic activity to suggest skeletal metastasis. Incidental CT findings: None IMPRESSION: 1. Evidence of breast cancer recurrence at multiple sites within the LEFT hemithorax primarily within the pleural and subpleural lung with pedicular concentration in the medial LEFT upper lobe along the mediastinal pleural surface and the anterior sub pleural lung. Several discrete pulmonary nodules within the LEFT upper lobe are also hypermetabolic consistent with metastasis. 2. Moderate LEFT effusion is concerning for malignant effusion. 3. No evidence of metastatic disease outside of the LEFT hemithorax. Electronically Signed   By: Suzy Bouchard M.D.   On: 09/12/2017 17:02   US Thoracentesis Asp Pleural Space W/img Guide  Result Date: 09/18/2017 INDICATION: Lung cancer. Recurrence left pleural effusion. Request for therapeutic thoracentesis. EXAM: ULTRASOUND GUIDED LEFT THORACENTESIS MEDICATIONS: 1% lidocaine 10 mL COMPLICATIONS: None immediate. PROCEDURE: An ultrasound guided thoracentesis was thoroughly discussed with the patient and questions answered. The benefits, risks, alternatives and complications were also discussed. The patient understands  and wishes to proceed with the procedure. Written consent was obtained. Ultrasound was performed to localize and mark an adequate pocket of fluid in the left chest. The area was then prepped and draped in the normal sterile fashion. 1% Lidocaine was used for local anesthesia. Under ultrasound guidance a 6 Fr Safe-T-Centesis catheter was introduced. Thoracentesis was performed. The catheter was removed and a dressing applied. FINDINGS: A total of approximately 950 mL of is clear amber fluid was removed. IMPRESSION: Successful ultrasound guided left thoracentesis yielding 950 mL of pleural fluid. No pneumothorax on post-procedure chest x-ray. Read by: Gareth Eagle, PA-C Electronically Signed   By: Aletta Edouard M.D.   On: 09/18/2017 16:05    Labs:  CBC: Recent Labs    08/24/17 1135 08/25/17 0517 09/17/17 1222 09/30/17 1637  WBC 11.1* 9.4 10.8* 11.5*  HGB 14.6 13.3 13.4 13.7  HCT 43.6 42.1 42.0 44.0  PLT 378 411* 360 457*    COAGS: Recent Labs    09/30/17 1637 10/01/17 0800  INR 2.36 2.63    BMP: Recent Labs    08/24/17 1135 08/25/17 0517 09/17/17 1222 09/30/17 1637  NA 136 135 137 139  K 3.8 4.3 3.7 3.9  CL 96* 91* 91* 90*  CO2 28 32 33* 35*  GLUCOSE 107* 107* 102* 108*  BUN 20 24* 26* 22  CALCIUM 9.0 9.1 9.6 9.0  CREATININE 0.83 1.07* 0.85 0.75  GFRNONAA >60 47* >60 >60  GFRAA >60 55* >60 >60    LIVER FUNCTION TESTS: Recent Labs    08/25/17 0517 09/17/17 1222 09/30/17 1637  BILITOT 0.6 0.3 0.4  AST 35 25 23  ALT 19 13 12   ALKPHOS 53 60 58  PROT 7.0 7.4 6.9  ALBUMIN 3.6 3.6 3.3*    TUMOR MARKERS: No results for input(s): AFPTM, CEA, CA199, CHROMGRNA in the last 8760 hours.  Assessment and Plan: Recurrent malignant left pleural effusion. Agree that PleurX cath placement is a reasonable thing to do in this situation. Pt and sons are in favor of this plan. Await final input from Dr. Lindi Adie, but will tentatively plan for (L)PleurX tomorrow. Xarelto has  been held though INR remains elevated.  NPO p MN Risks and benefits of image guided pleurx placement was discussed with the patient including, but not limited to bleeding, infection, pneumothorax, or fibrin sheath development and need for additional procedures.  All of the patient's questions were answered, patient is agreeable to proceed. Consent signed and in chart.    Thank you for this interesting consult.  I greatly enjoyed meeting [name] and look forward to participating in their care.  A copy of this report was sent to the requesting provider on this date.  Electronically Signed: Ascencion Dike, PA-C 10/01/2017, 2:55 PM   I spent a total of 20 minutes in face to face in clinical consultation, greater than 50% of which was counseling/coordinating care for pleurx

## 2017-10-02 ENCOUNTER — Encounter (HOSPITAL_COMMUNITY): Payer: Self-pay | Admitting: Interventional Radiology

## 2017-10-02 ENCOUNTER — Inpatient Hospital Stay (HOSPITAL_COMMUNITY): Payer: Medicare Other

## 2017-10-02 DIAGNOSIS — C3492 Malignant neoplasm of unspecified part of left bronchus or lung: Principal | ICD-10-CM

## 2017-10-02 DIAGNOSIS — R0602 Shortness of breath: Secondary | ICD-10-CM

## 2017-10-02 DIAGNOSIS — I4891 Unspecified atrial fibrillation: Secondary | ICD-10-CM

## 2017-10-02 DIAGNOSIS — I10 Essential (primary) hypertension: Secondary | ICD-10-CM

## 2017-10-02 DIAGNOSIS — J449 Chronic obstructive pulmonary disease, unspecified: Secondary | ICD-10-CM

## 2017-10-02 DIAGNOSIS — H532 Diplopia: Secondary | ICD-10-CM

## 2017-10-02 DIAGNOSIS — J90 Pleural effusion, not elsewhere classified: Secondary | ICD-10-CM

## 2017-10-02 HISTORY — PX: IR GUIDED DRAIN W CATHETER PLACEMENT: IMG719

## 2017-10-02 HISTORY — PX: IR US GUIDE BX ASP/DRAIN: IMG2392

## 2017-10-02 LAB — CBC
HCT: 41.3 % (ref 36.0–46.0)
HEMOGLOBIN: 12.6 g/dL (ref 12.0–15.0)
MCH: 32.7 pg (ref 26.0–34.0)
MCHC: 30.5 g/dL (ref 30.0–36.0)
MCV: 107.3 fL — ABNORMAL HIGH (ref 78.0–100.0)
PLATELETS: 425 10*3/uL — AB (ref 150–400)
RBC: 3.85 MIL/uL — AB (ref 3.87–5.11)
RDW: 13.4 % (ref 11.5–15.5)
WBC: 10.4 10*3/uL (ref 4.0–10.5)

## 2017-10-02 LAB — BASIC METABOLIC PANEL
ANION GAP: 12 (ref 5–15)
BUN: 22 mg/dL (ref 8–23)
CHLORIDE: 86 mmol/L — AB (ref 98–111)
CO2: 39 mmol/L — ABNORMAL HIGH (ref 22–32)
Calcium: 8.9 mg/dL (ref 8.9–10.3)
Creatinine, Ser: 0.74 mg/dL (ref 0.44–1.00)
GFR calc Af Amer: >60 mL/min (ref >60)
Glucose, Bld: 103 mg/dL — ABNORMAL HIGH (ref 70–99)
POTASSIUM: 3.8 mmol/L (ref 3.5–5.1)
SODIUM: 137 mmol/L (ref 135–145)

## 2017-10-02 LAB — PROTIME-INR
INR: 1.19
Prothrombin Time: 15 seconds (ref 11.4–15.2)

## 2017-10-02 MED ORDER — LORAZEPAM 2 MG/ML IJ SOLN: 0.5000 mg | INTRAMUSCULAR | Status: DC | PRN

## 2017-10-02 MED ORDER — NALOXONE HCL 0.4 MG/ML IJ SOLN
INTRAMUSCULAR | Status: AC
  Filled 2017-10-02: qty 1

## 2017-10-02 MED ORDER — ALBUTEROL SULFATE (2.5 MG/3ML) 0.083% IN NEBU
2.5000 mg | INHALATION_SOLUTION | RESPIRATORY_TRACT | Status: DC | PRN
Start: 2017-10-02 — End: 2017-10-03

## 2017-10-02 MED ORDER — MIDAZOLAM HCL 2 MG/2ML IJ SOLN
INTRAMUSCULAR | Status: AC | PRN
  Administered 2017-10-02 (×2): 0.5 mg via INTRAVENOUS

## 2017-10-02 MED ORDER — FENTANYL CITRATE (PF) 100 MCG/2ML IJ SOLN
12.5000 ug | INTRAMUSCULAR | Status: DC | PRN
  Administered 2017-10-02 – 2017-10-03 (×2): 12.5 ug via INTRAVENOUS
  Filled 2017-10-02 (×3): qty 2

## 2017-10-02 MED ORDER — IPRATROPIUM-ALBUTEROL 0.5-2.5 (3) MG/3ML IN SOLN
3.0000 mL | Freq: Three times a day (TID) | RESPIRATORY_TRACT | Status: DC
  Administered 2017-10-02 – 2017-10-03 (×3): 3 mL via RESPIRATORY_TRACT
  Filled 2017-10-02 (×3): qty 3

## 2017-10-02 MED ORDER — MIDAZOLAM HCL 2 MG/2ML IJ SOLN
INTRAMUSCULAR | Status: AC
  Filled 2017-10-02: qty 2

## 2017-10-02 MED ORDER — LIDOCAINE HCL 1 % IJ SOLN
INTRAMUSCULAR | Status: AC
  Filled 2017-10-02: qty 20

## 2017-10-02 MED ORDER — FLUMAZENIL 0.5 MG/5ML IV SOLN
INTRAVENOUS | Status: AC
  Filled 2017-10-02: qty 5

## 2017-10-02 MED ORDER — FENTANYL CITRATE (PF) 100 MCG/2ML IJ SOLN
INTRAMUSCULAR | Status: AC
  Administered 2017-10-02: 12.5 ug via INTRAVENOUS
  Filled 2017-10-02: qty 2

## 2017-10-02 MED ORDER — FENTANYL CITRATE (PF) 100 MCG/2ML IJ SOLN
INTRAMUSCULAR | Status: AC | PRN
  Administered 2017-10-02 (×2): 25 ug via INTRAVENOUS

## 2017-10-02 NOTE — Progress Notes (Addendum)
[facility])  Received request from [place] for family interest in [facility]. Chart reviewed and met with patient's son Deidre Ala outside room while RN worked with patient. Explained services and answered questions. Plan to follow up with Deidre Ala later this afternoon. Per Deidre Ala, plan is for patient to have drain placed later today. Will update CSW later today.   Addendum: Had follow up meeting when patient's son Nicki Reaper arrived. Confirmed [facility] does not have a room to offer for tomorrow at the moment. Deidre Ala and Glenn both expressed interest in [facility] if [facility] is not available tomorrow. Made CSW Elmyra Ricks aware. Hospital Liaison will update CSW and family tomorrow morning.   Thank you,  Erling Conte, LCSW [phone]

## 2017-10-02 NOTE — Progress Notes (Signed)
Patient family has requested a referral be sent to the [facility].  CSW made referral/ sent requested clinicals to [phone].  Kathrin Greathouse, Marlinda Mike, MSW Clinical Social Worker  [phone] 10/02/2017  3:38 PM

## 2017-10-02 NOTE — Clinical Social Work Note (Addendum)
Clinical Social Work Assessment  Patient Details  Name: [name] MRN: [id number] Date of Birth: 06-Jun-1934  Date of referral:  10/02/17               Reason for consult:  Facility Placement                Permission sought to share information with:    Permission granted to share information::  Yes, Verbal Permission Granted  Name::        Agency::  [facility]  Relationship::  Sons   Contact Information:   [phone], [phone]  Housing/Transportation Living arrangements for the past 2 months:  [place] of Information:  Patient Patient Interpreter Needed:  None Criminal Activity/Legal Involvement Pertinent to Current Situation/Hospitalization:  No - Comment as needed Significant Relationships:  Adult Children Lives with:    Do you feel safe going back to the place where you live?  No Need for family participation in patient care:  Yes (Comment)  Care giving concerns:   [place].   Social Worker assessment / plan:  CSW met with patient sons [name] and [name] to discuss the patient transition to [employer]. CSW explained the process, and both report understanding.  Patient sons are agreeable to meet with HPOG liaison to discuss further care for the patient.  CSW made referral to [facility]. She will follow up with the patient family.   Plan: Residential Hospice.   Employment status:  Retired Nurse, adult PT Recommendations:    Information / Referral to community resources:     Patient/Family's Response to care:  Agreeable to care. Appreciative of CSW services.   Patient/Family's Understanding of and Emotional Response to Diagnosis, Current Treatment, and Prognosis: Patient sons met with the palliative care services to discuss patient goals of care and prognosis. Patient sons at bedside providing emotional support to the patient.   Emotional Assessment Appearance:  Appears  stated age Attitude/Demeanor/Rapport:    Affect (typically observed):  Unable to Assess Orientation:    Alcohol / Substance use:    Psych involvement (Current and /or in the community):  No (Comment)  Discharge Needs  Concerns to be addressed:  Discharge Planning Concerns Readmission within the last 30 days:  No Current discharge risk:  Terminally ill Barriers to Discharge:  Continued Medical Work up   [employer], LCSW 10/02/2017, 10:56 AM

## 2017-10-02 NOTE — Consult Note (Signed)
Consultation Note Date: 10/02/2017   Patient Name: [name]  DOB: [birth year]  MRN: [id number]  Age / Sex: 83 y.o., female  PCP: [name], MD Referring Physician: Cristal Ford, DO  Reason for Consultation: Establishing goals of care  HPI/Patient Profile: 83 y.o. female    admitted on 09/30/2017    Clinical Assessment and Goals of Care:  83 year old lady with a history of breast cancer in the past, atrial fibrillation, high blood pressure, COPD. Patient lives at home by herself. Patient has 2 sons who live nearby. Patient has been having recurrent pleural effusions requiring thoracenteses twice over the course of the past month. Cytology shows non-small cell lung cancer squamous cell carcinoma of the lung. Patient also with multiple pulmonary nodules. Patient has been having a lot of shortness of breath and anxiety. Patient has had marked progressive decline in her functional status. She was admitted to hospital medicine service for recurrent pleural effusion. She has been seen and evaluated by medical oncology with final recommendations for proceeding with hospice services.  Palliative consult follows.  Patient is a weak appearing lady sitting up by the edge of the bed. She is asking for most all of her by mouth medications to be discontinued. She appears to be in at least mild to moderate degree of respiratory distress. Discussed extensively with both sons Nicki Reaper and Deidre Ala present at the bedside. I introduced myself and palliative care as follows:  Palliative medicine is specialized medical care for people living with serious illness. It focuses on providing relief from the symptoms and stress of a serious illness. The goal is to improve quality of life for both the patient and the family.  Goals wishes and values discussed. We discussed about CODE STATUS. Reviewed extensively about the patient's ears life  limiting incurable illnesses. Patient has metastatic squamous cell cancer, management pleural effusions. Patient is to undergo PleurX catheter placement as a symptom relief measure. Hospice philosophy of care introduced and discussed extensively. Comfort measures only also discussed in detail. CODE STATUS of DO NOT RESUSCITATE/DO NOT INTUBATE also discussed in detail. See additional discussions/recommendations below. Thank you for the consult.   NEXT OF KIN  2 sons Nicki Reaper and Deidre Ala  SUMMARY OF RECOMMENDATIONS   1. Code status now established as DNR DNI.  2. Discussed with patient/family regarding appropriate symptom management of dyspnea and anxiety. D/C medications non contributory to comfort.  3. Hospice philosophy of care discussed, discussed in detail about different modes of hospice care. Prognosis likely 2 weeks, high symptom burden, will benefit from residential hospice. CSW consulted.  4. Agree with Pleur X catheter placement today, anticipate transfer to residential hospice on 10-03-17. Thank you for the consult.   Code Status/Advance Care Planning:  DNR    Symptom Management:    as above   Palliative Prophylaxis:   Delirium Protocol   Psycho-social/Spiritual:   Desire for further Chaplaincy support:yes  Additional Recommendations: Education on Hospice  Prognosis:   < 2 weeks  Discharge Planning: Hospice facility  Primary Diagnoses: Present on Admission: . Recurrent pleural effusion on left . Squamous cell lung cancer, left ([place]) . DYSPNEA . COPD mixed type ([place]) . A-fib ([place]) . Essential hypertension . Diplopia . Pleural effusion, malignant   I have reviewed the medical record, interviewed the patient and family, and examined the patient. The following aspects are pertinent.  Past Medical History:  Diagnosis Date  . A-fib ([place])    takes Amiodarone and Xarelto daily  . Allergy history unknown   . Anxiety    takes Ativan daily as needed  . Aortic  stenosis    mild 02/2015 (Dr. Terrence Dupont)  . Arthritis   . Breast cancer ([place])   . Breast cancer of lower-inner quadrant of left female breast ([place]) 02/09/2015  . Bruises easily   . COPD (chronic obstructive pulmonary disease) ([facility])    ALdactone and Spirva daily.Dulera daily.Albuterol daily as needed  . COPD, severe   . Depression   . Emphysema (subcutaneous) (surgical) resulting from a procedure   . Fibromyalgia   . Fibromyalgia   . History of bronchitis 2-3 yrs ago  . History of colon polyps    benign  . Hyperlipidemia    takes Atorvastatin daily  . Hypertension    takes Losartan daily  . Hypothyroidism    takes Synthroid daily  . IBS (irritable bowel syndrome)   . Joint pain   . Osteoarthritis   . Peripheral edema    takes Furosemide and Spirolactone daily  . Peripheral neuropathy   . Pneumonia    hx of-9 yrs ago  . Radiation 05/05/15-07/07/15   left breast 50.4 Gy, left breast boost 14 Gy, left axilla/SCF 50.4 Gy  . Radiation 05/05/15-07/07/15   left breast 50.4 Gy boosted to 14 gy  . Shortness of breath dyspnea    with exertion  . Sinus problem   . Stroke [facility])    affected left leg   Social History   Socioeconomic History  . Marital status: Single    Spouse name: Not on file  . Number of children: 3  . Years of education: Not on file  . Highest education level: Not on file  Occupational History  . Occupation: retired  Scientific laboratory technician  . Financial resource strain: Somewhat hard  . Food insecurity:    Worry: Never true    Inability: Never true  . Transportation needs:    Medical: No    Non-medical: No  Tobacco Use  . Smoking status: Former Smoker    Packs/day: 0.50    Types: Cigarettes    Last attempt to quit: 02/27/2003    Years since quitting: 14.6  . Smokeless tobacco: Never Used  . Tobacco comment: quit smoking 13 yrs ago  Substance and Sexual Activity  . Alcohol use: Yes    Alcohol/week: 0.0 oz    Comment: occasionally beer  . Drug use: No  . Sexual  activity: Not Currently    Partners: Male    Birth control/protection: None  Lifestyle  . Physical activity:    Days per week: 0 days    Minutes per session: 0 min  . Stress: Rather much  Relationships  . Social connections:    Talks on phone: More than three times a week    Gets together: More than three times a week    Attends religious service: 1 to 4 times per year    Active member of club or organization: No    Attends meetings of clubs or organizations: Never  Relationship status: Patient refused  Other Topics Concern  . Not on file  Social History Narrative  . Not on file   Family History  Adopted: Yes   Scheduled Meds: . feeding supplement (ENSURE ENLIVE)  237 mL Oral BID BM  . furosemide  40 mg Oral Daily  . ipratropium-albuterol  3 mL Nebulization TID  . mirtazapine  15 mg Oral QHS  . mometasone-formoterol  2 puff Inhalation BID  . sertraline  25 mg Oral Daily  . spironolactone  25 mg Oral Daily   Continuous Infusions: PRN Meds:.acetaminophen, albuterol, fentaNYL (SUBLIMAZE) injection, loperamide, LORazepam, LORazepam, ondansetron **OR** ondansetron (ZOFRAN) IV Medications Prior to Admission:  Prior to Admission medications   Medication Sig Start Date End Date Taking? Authorizing Provider  acetaminophen (TYLENOL) 325 MG tablet Take 650 mg by mouth every 4 (four) hours as needed (FOR PAIN.). Reported on 04/20/2015   Yes [provider]  albuterol (PROVENTIL HFA;VENTOLIN HFA) 108 (90 Base) MCG/ACT inhaler Inhale 2 puffs into the lungs every 6 (six) hours as needed for wheezing or shortness of breath.   Yes [provider]  amiodarone (PACERONE) 200 MG tablet Take 100 mg by mouth daily.  07/18/10  Yes [provider]  amLODipine (NORVASC) 5 MG tablet Take 5 mg by mouth daily. 11/24/15  Yes [provider]  anastrozole (ARIMIDEX) 1 MG tablet Take 1 tablet (1 mg total) by mouth daily. 08/15/17  Yes Nicholas Lose, MD  atorvastatin  (LIPITOR) 10 MG tablet Take 10 mg by mouth daily.  08/29/14  Yes [provider]  BIOTIN PO Take 1 tablet by mouth daily.   Yes [provider]  furosemide (LASIX) 40 MG tablet Take 40 mg by mouth daily. Reported on 06/28/2015 04/10/11  Yes [provider]  IRON PO Take 1 tablet by mouth daily.   Yes [provider]  levothyroxine (SYNTHROID, LEVOTHROID) 125 MCG tablet Take 1 tablet by mouth daily. 09/27/16  Yes [provider]  LORazepam (ATIVAN) 0.5 MG tablet Take 0.5 mg by mouth 2 (two) times daily as needed for anxiety. 08/19/17  Yes [provider]  losartan (COZAAR) 100 MG tablet Take 100 mg by mouth daily.   Yes [provider]  mirtazapine (REMERON) 15 MG tablet Take 1 tablet (15 mg total) by mouth at bedtime. 09/17/17  Yes Tanner, Lyndon Code., PA-C  mometasone-formoterol (DULERA) 100-5 MCG/ACT AERO Inhale 2 puffs into the lungs 2 (two) times daily. 03/17/13  Yes Young, Tarri Fuller D, MD  rivaroxaban (XARELTO) 20 MG TABS tablet Take 1 tablet (20 mg total) by mouth daily with supper. Please restart this medication on Friday 12/09/15. 12/07/15  Yes Collene Gobble, MD  sertraline (ZOLOFT) 25 MG tablet Take 25 mg by mouth daily. 06/13/17  Yes [provider]  spironolactone (ALDACTONE) 25 MG tablet Take 25 mg by mouth daily.  11/27/14  Yes [provider]  calcium carbonate (OS-CAL) 600 MG TABS tablet Take 600 mg by mouth daily with breakfast. Reported on 08/18/2015    [provider]  folic acid (FOLVITE) 527 MCG tablet Take 400 mcg by mouth daily.    [provider]  magnesium oxide (MAG-OX) 400 MG tablet Take 400 mg by mouth 2 (two) times daily.      [provider]  Multiple Vitamin (MULTIVITAMIN) tablet Take 1 tablet by mouth daily.    [provider]  tiotropium (SPIRIVA) 18 MCG inhalation capsule Place 1 capsule (18 mcg total) into inhaler and inhale  daily. Patient not taking: Reported on  09/30/2017 11/05/12   Deneise Lever, MD  vitamin B-12 (CYANOCOBALAMIN) 1000 MCG tablet Take 1,000 mcg by mouth daily. Reported on 08/18/2015    [provider]  digoxin (LANOXIN) 0.125 MG tablet Take 1 tablet by mouth Daily. 07/18/10 08/27/17  [provider]   Allergies  Allergen Reactions  . Codeine Swelling    SWELLING REACTION UNSPECIFIED   . Penicillins Other (See Comments)    UNSPECIFIED REACTION  Has patient had a PCN reaction causing immediate rash, facial/tongue/throat swelling, SOB or lightheadedness with hypotension:unsure Has patient had a PCN reaction causing severe rash involving mucus membranes or skin necrosis:unsure Has patient had a PCN reaction that required hospitalization:NO Has patient had a PCN reaction occurring within the last 10 years:NO If all of the above answers are "NO", then may proceed with Cephalosporin use.   [name] [Silver Sulfadiazine] Rash   Review of Systems +pain +anxiety +shortness of breath  Physical Exam Ill appearing lady sitting up at the side of the bed Appears to be in at least mild - moderate degree of distress because of shortness of breath and anxiety S1-S2 Diminished breath sounds throughout left worse than right Abdomen is soft non tender Patient is thin and has muscle wasting Does not have edema Awake alert oriented answers questions appropriately  Vital Signs: BP (!) 153/64 (BP Location: Left Arm)   Pulse 63   Temp 98.4 F (36.9 C) (Oral)   Resp (!) 24   Ht 5\' 3"  (1.6 m)   Wt 60.8 kg (134 lb)   SpO2 90%   BMI 23.74 kg/m  Pain Scale: 0-10   Pain Score: Asleep   SpO2: SpO2: 90 % O2 Device:SpO2: 90 % O2 Flow Rate: .O2 Flow Rate (L/min): 2 L/min  IO: Intake/output summary:   Intake/Output Summary (Last 24 hours) at 10/02/2017 1042 Last data filed at 10/02/2017 0834 Gross per 24 hour  Intake 420.06 ml  Output 100 ml  Net 320.06 ml    LBM: Last BM Date: 10/01/17 Baseline Weight: Weight: 60.8  kg (134 lb) Most recent weight: Weight: 60.8 kg (134 lb)     Palliative Assessment/Data:   PPS 30%  Time In:  10 Time Out:11.10   Time Total:  70 Greater than 50%  of this time was spent counseling and coordinating care related to the above assessment and plan.  Signed by: Loistine Chance, MD  [phone]  Please contact Palliative Medicine Team phone at [phone] for questions and concerns.  For individual provider: See Shea Evans

## 2017-10-02 NOTE — Progress Notes (Signed)
PROGRESS NOTE    [name]  OZD:[id number] DOB: 06/05/1934 DOA: 09/30/2017 PCP: [name], MD   Brief Narrative:  HPI on 09/30/2017 by Dr. Jennette Kettle [name] is a 83 y.o. female with medical history significant of BRCA in past, a.fib, COPD, HTN.  Patient has been having trouble with recurrent pleural effusion drained twice over past month.  Cytology on drainage 6/30 appears c/w NSCLC (squamous cell) carcinoma of lung.  Also has h/o multiple pulm nodules.  Over last 2 weeks has had a lot of SOB.  Therefore she can hardly get out of bed now.  She is having incontinence of urine. She also reports fairly frequent stool with some incontinence. No abdominal pain. Sons note a little bit of swelling in her legs.  Also problems with double vision that have been worsening over the past month.  MRI brain done a month ago was negative.  Interim history Admitted for recurrent left pleural effusion.  Oncology consulted and recommended hospice and Pleurx catheter placement.  IR and palliative consulted.  Assessment & Plan   Recurrent malignant left-sided pleural effusion -Chest x-ray reviewed, shows a pleural effusion on the left -Interventional radiology consulted for thoracentesis versus Pleurx catheter placement.  Patient to have Pleurx catheter placement later today. -Oncology consulted and appreciated, agrees with Pleurx catheter placement.  Also recommended hospice placement as well as palliative care consult.  Diplopia -CT head showed suspect retroconal mass left orbit, possibly metastatic disease  Atrial fibrillation -Xarelto held pending Pleurx catheter placement -Continue amiodarone  Essential hypertension -Continue amlodipine, Cozaar  Essential hypertension -Continue statin  Hypothyroidism -Continue Synthroid  COPD -Continue dulera, neb treatments  Depression -Continue Zoloft  Chronic diarrhea -Continue Imodium -Patient currently afebrile without any abdominal  pain, do not suspect infectious etiology  Goals of care -oncology feels that patient is frail for chemotherapy and essentially there is no systemic treatment options.  Recommended hospice care along with Pleurex catheter for comfort. -Palliative care consulted and appreciated -patient currently DNR  DVT Prophylaxis  Xarelto held   Code Status: DNR  Family Communication: Son at bedside  Disposition Plan: admitted, pending pleurex catheter placement  Consultants Oncology Interventional radiology Palliative care  Procedures  None  Antibiotics   Anti-infectives (From admission, onward)   Start     Dose/Rate Route Frequency Ordered Stop   10/02/17 0600  vancomycin (VANCOCIN) IVPB 1000 mg/200 mL premix     1,000 mg 200 mL/hr over 60 Minutes Intravenous On call 10/01/17 1454 10/02/17 0800      Subjective:   [name] seen and examined today.  Complains of shortness of breath and inability to lay down.  Denies current chest pain, abdominal pain, nausea or vomiting, diarrhea constipation, dizziness or headache.  Objective:   Vitals:   10/01/17 1929 10/01/17 2111 10/02/17 0406 10/02/17 1000  BP:  (!) 153/64    Pulse:  63    Resp:  (!) 24    Temp:  98.4 F (36.9 C)    TempSrc:  Oral    SpO2: 96% 95% 94% 90%  Weight:      Height:        Intake/Output Summary (Last 24 hours) at 10/02/2017 1151 Last data filed at 10/02/2017 0834 Gross per 24 hour  Intake 420.06 ml  Output 100 ml  Net 320.06 ml   Filed Weights   09/30/17 1458  Weight: 60.8 kg (134 lb)    Exam  General: Well developed, ill appearing, NAD, appears stated age  HEENT: NCAT, mucous membranes moist.   Neck: Supple  Cardiovascular: S1 S2 auscultated, no rubs, murmurs or gallops. Regular rate and rhythm.  Respiratory: Diminished breath sounds, no wheezing   Abdomen: Soft, nontender, nondistended, + bowel sounds  Extremities: warm dry without cyanosis clubbing or edema  Neuro: AAOx3,  nonfocal  Psych: Normal affect and demeanor    Data Reviewed: I have personally reviewed following labs and imaging studies  CBC: Recent Labs  Lab 09/30/17 1637 10/02/17 0422  WBC 11.5* 10.4  NEUTROABS 9.1*  --   HGB 13.7 12.6  HCT 44.0 41.3  MCV 106.3* 107.3*  PLT 457* 478*   Basic Metabolic Panel: Recent Labs  Lab 09/30/17 1637 10/02/17 0422  NA 139 137  K 3.9 3.8  CL 90* 86*  CO2 35* 39*  GLUCOSE 108* 103*  BUN 22 22  CREATININE 0.75 0.74  CALCIUM 9.0 8.9   GFR: Estimated Creatinine Clearance: 45.6 mL/min (by C-G formula based on SCr of 0.74 mg/dL). Liver Function Tests: Recent Labs  Lab 09/30/17 1637  AST 23  ALT 12  ALKPHOS 58  BILITOT 0.4  PROT 6.9  ALBUMIN 3.3*   Recent Labs  Lab 09/30/17 1637  LIPASE 21   No results for input(s): AMMONIA in the last 168 hours. Coagulation Profile: Recent Labs  Lab 09/30/17 1637 10/01/17 0800 10/02/17 0422  INR 2.36 2.63 1.19   Cardiac Enzymes: No results for input(s): CKTOTAL, CKMB, CKMBINDEX, TROPONINI in the last 168 hours. BNP (last 3 results) Recent Labs    08/22/17 1716  PROBNP 60.0   HbA1C: No results for input(s): HGBA1C in the last 72 hours. CBG: No results for input(s): GLUCAP in the last 168 hours. Lipid Profile: No results for input(s): CHOL, HDL, LDLCALC, TRIG, CHOLHDL, LDLDIRECT in the last 72 hours. Thyroid Function Tests: No results for input(s): TSH, T4TOTAL, FREET4, T3FREE, THYROIDAB in the last 72 hours. Anemia Panel: No results for input(s): VITAMINB12, FOLATE, FERRITIN, TIBC, IRON, RETICCTPCT in the last 72 hours. Urine analysis:    Component Value Date/Time   COLORURINE YELLOW 09/30/2017 [place] 09/30/2017 1637   LABSPEC 1.023 09/30/2017 1637   PHURINE 5.0 09/30/2017 1637   GLUCOSEU NEGATIVE 09/30/2017 1637   HGBUR NEGATIVE 09/30/2017 1637   BILIRUBINUR NEGATIVE 09/30/2017 1637   KETONESUR NEGATIVE 09/30/2017 1637   PROTEINUR NEGATIVE 09/30/2017  1637   UROBILINOGEN 0.2 03/25/2012 1507   NITRITE NEGATIVE 09/30/2017 1637   LEUKOCYTESUR TRACE (A) 09/30/2017 1637   Sepsis Labs: @LABRCNTIP (procalcitonin:4,lacticidven:4)  )No results found for this or any previous visit (from the past 240 hour(s)).    Radiology Studies: Dg Chest 2 View  Result Date: 09/30/2017 CLINICAL DATA:  Increasing shortness of breath. EXAM: CHEST - 2 VIEW COMPARISON:  09/18/2017 FINDINGS: Cardiac silhouette is obscured by significantly enlarging left pleural effusion. No right pleural effusion identified. Right lung appears clear. IMPRESSION: 1. Increase in volume left pleural effusion. Electronically Signed   By: Kerby Moors M.D.   On: 09/30/2017 17:24   Ct Head Wo Contrast  Result Date: 09/30/2017 CLINICAL DATA:  83 year old female with new incontinence. Disoriented and seeing double. Not eating properly. Lung cancer and breast cancer. Initial encounter. EXAM: CT HEAD WITHOUT CONTRAST TECHNIQUE: Contiguous axial images were obtained from the base of the skull through the vertex without intravenous contrast. COMPARISON:  09/05/2017 brain MR. FINDINGS: Brain: Exam is motion degraded. No intracranial hemorrhage or CT evidence of large acute infarct. Chronic microvascular changes. Atrophy. No intracranial mass lesion  noted on this unenhanced exam. Vascular: Vascular calcifications Skull: No acute abnormality Sinuses/Orbits: Suspect retroconal mass left orbit, possibly metastatic disease. Other: Mastoid air cells and middle ear cavities are clear. IMPRESSION: 1. Exam is motion degraded. 2. No intracranial hemorrhage or CT evidence of large acute infarct. Chronic microvascular changes. Atrophy. 3. Suspect retroconal mass left orbit, possibly metastatic disease. Electronically Signed   By: Genia Del M.D.   On: 09/30/2017 17:03     Scheduled Meds: . feeding supplement (ENSURE ENLIVE)  237 mL Oral BID BM  . furosemide  40 mg Oral Daily  . ipratropium-albuterol  3 mL  Nebulization TID  . mirtazapine  15 mg Oral QHS  . mometasone-formoterol  2 puff Inhalation BID  . sertraline  25 mg Oral Daily  . spironolactone  25 mg Oral Daily   Continuous Infusions:   LOS: 1 day   Time Spent in minutes   30 minutes  Kensington Rios D.O. on 10/02/2017 at 11:51 AM  Between 7am to 7pm - Pager - [phone]  After 7pm go to [email or web address] - password [email or web address]  And look for the night coverage person covering for me after hours  [facility] Office  [phone]

## 2017-10-03 ENCOUNTER — Encounter (HOSPITAL_COMMUNITY): Payer: Self-pay | Admitting: Interventional Radiology

## 2017-10-03 NOTE — Discharge Summary (Signed)
Physician Discharge Summary  [name] GBT:[id number] DOB: 06-Feb-1935 DOA: 09/30/2017  PCP: [name], MD  Admit date: 09/30/2017 Discharge date: 10/03/2017  Time spent: 45 minutes  Recommendations for Outpatient Follow-up:  Patient will be discharged to [facility].   Discharge Diagnoses:  Recurrent malignant left-sided pleural effusion Diplopia Atrial fibrillation Essential hypertension Essential hypertension Hypothyroidism COPD Depression Chronic diarrhea Goals of care  Discharge Condition: Stable  Diet recommendation: Comfort  Filed Weights   09/30/17 1458  Weight: 60.8 kg    History of present illness:  on 09/30/2017 by Dr. Loree Fee Longis a 83 y.o.femalewith medical history significant ofBRCA in past, a.fib, COPD, HTN. Patient has been having trouble with recurrent pleural effusion drained twice over past month. Cytology on drainage 6/30 appears c/w NSCLC (squamous cell) carcinoma of lung. Also has h/o multiple pulm nodules.  Over last 2 weeks has had a lot of SOB.Therefore she can hardly get out of bed now. She is having incontinence of urine. She also reports fairly frequent stool with some incontinence. No abdominal pain. Sons note a little bit of swelling in her legs.Also problems with double vision that have been worsening over the past month. MRI brain done a month ago was negative.  Hospital Course:  Recurrent malignant left-sided pleural effusion -Chest x-ray reviewed, shows a pleural effusion on the left -Oncology consulted and appreciated, agrees with Pleurx catheter placement.  Also recommended hospice placement as well as palliative care consult. -Interventional radiology consulted for thoracentesis versus Pleurx catheter placement.  s/p Pleurx catheter placement  Diplopia -CT head showed suspect retroconal mass left orbit, possibly metastatic disease  Atrial fibrillation -Xarelto held pending Pleurx catheter  placement -Amiodarone discontinued by palliative care -may continue xarelto on discharge if patient and family wish to continue   Essential hypertension -amlodipine, Cozaar discontinued per palliative   Hyperlipidemia -statin discontinued per palliative   Hypothyroidism -Synthroid discontinued per palliative   COPD -Continue dulera, neb treatments  Depression -Continue Zoloft  Chronic diarrhea -Continue Imodium PRN -Patient currently afebrile without any abdominal pain, do not suspect infectious etiology  Goals of care -oncology feels that patient is frail for chemotherapy and essentially there is no systemic treatment options.  Recommended hospice care along with Pleurex catheter for comfort. -Palliative care consulted and appreciated -patient currently DNR -plan to discharge to residential hospice  Consultants Oncology Interventional radiology Palliative care  Procedures  Insertion of tunneled pleural drainage catheter  Discharge Exam:  10/03/17 0500  BP: 126/70  Pulse: 78  Resp: 18  Temp: 98.1 F (36.7 C)  SpO2: 98%     General: Well developed, chronically ill appearing, NAD  HEENT: NCAT, mucous membranes moist.  Neck: Supple  Cardiovascular: S1 S2 auscultated, RRR, no murmur  Respiratory: Diminished breath sounds  Abdomen: Soft, nontender, nondistended, + bowel sounds  Extremities: warm dry without cyanosis clubbing or edema  Neuro: AAOx3, nonfocal  Psych: Appropriate mood and affect, pleasant   Discharge Instructions  Allergies as of 10/03/2017      Reactions   Codeine Swelling   SWELLING REACTION UNSPECIFIED   Penicillins Other (See Comments)   UNSPECIFIED REACTION  Has patient had a PCN reaction causing immediate rash, facial/tongue/throat swelling, SOB or lightheadedness with hypotension:unsure Has patient had a PCN reaction causing severe rash involving mucus membranes or skin necrosis:unsure Has patient had a PCN reaction  that required hospitalization:NO Has patient had a PCN reaction occurring within the last 10 years:NO If all of the above answers  are "NO", then may proceed with Cephalosporin use.   Silvadene [silver Sulfadiazine] Rash      Medication List    STOP taking these medications   amiodarone 200 MG tablet Commonly known as:  PACERONE   amLODipine 5 MG tablet Commonly known as:  NORVASC   anastrozole 1 MG tablet Commonly known as:  ARIMIDEX   atorvastatin 10 MG tablet Commonly known as:  LIPITOR   BIOTIN PO   calcium carbonate 600 MG Tabs tablet Commonly known as:  OS-CAL   folic acid 630 MCG tablet Commonly known as:  FOLVITE   IRON PO   levothyroxine 125 MCG tablet Commonly known as:  SYNTHROID, LEVOTHROID   losartan 100 MG tablet Commonly known as:  COZAAR   magnesium oxide 400 MG tablet Commonly known as:  MAG-OX   multivitamin tablet   rivaroxaban 20 MG Tabs tablet Commonly known as:  XARELTO   tiotropium 18 MCG inhalation capsule Commonly known as:  SPIRIVA   vitamin B-12 1000 MCG tablet Commonly known as:  CYANOCOBALAMIN     TAKE these medications   acetaminophen 325 MG tablet Commonly known as:  TYLENOL Take 650 mg by mouth every 4 (four) hours as needed (FOR PAIN.). Reported on 04/20/2015   albuterol 108 (90 Base) MCG/ACT inhaler Commonly known as:  PROVENTIL HFA;VENTOLIN HFA Inhale 2 puffs into the lungs every 6 (six) hours as needed for wheezing or shortness of breath.   furosemide 40 MG tablet Commonly known as:  LASIX Take 40 mg by mouth daily. Reported on 06/28/2015   LORazepam 0.5 MG tablet Commonly known as:  ATIVAN Take 0.5 mg by mouth 2 (two) times daily as needed for anxiety.   mirtazapine 15 MG tablet Commonly known as:  REMERON Take 1 tablet (15 mg total) by mouth at bedtime.   mometasone-formoterol 100-5 MCG/ACT Aero Commonly known as:  DULERA Inhale 2 puffs into the lungs 2 (two) times daily.   sertraline 25 MG tablet Commonly  known as:  ZOLOFT Take 25 mg by mouth daily.   spironolactone 25 MG tablet Commonly known as:  ALDACTONE Take 25 mg by mouth daily.      Allergies  Allergen Reactions  . Codeine Swelling    SWELLING REACTION UNSPECIFIED   . Penicillins Other (See Comments)    UNSPECIFIED REACTION  Has patient had a PCN reaction causing immediate rash, facial/tongue/throat swelling, SOB or lightheadedness with hypotension:unsure Has patient had a PCN reaction causing severe rash involving mucus membranes or skin necrosis:unsure Has patient had a PCN reaction that required hospitalization:NO Has patient had a PCN reaction occurring within the last 10 years:NO If all of the above answers are "NO", then may proceed with Cephalosporin use.   [name] [Silver Sulfadiazine] Rash      The results of significant diagnostics from this hospitalization (including imaging, microbiology, ancillary and laboratory) are listed below for reference.    Significant Diagnostic Studies: Dg Chest 1 View  Result Date: 09/18/2017 CLINICAL DATA:  Status post thoracentesis EXAM: CHEST  1 VIEW COMPARISON:  09/17/2017 FINDINGS: Cardiac shadow is stable. Significant reduction in left pleural effusion is noted with only a small residual effusion identified. No pneumothorax is noted. The right lung is well aerated. IMPRESSION: No pneumothorax following left thoracentesis. Electronically Signed   By: Inez Catalina M.D.   On: 09/18/2017 14:48   Dg Chest 2 View  Result Date: 09/30/2017 CLINICAL DATA:  Increasing shortness of breath. EXAM: CHEST - 2 VIEW COMPARISON:  09/18/2017  FINDINGS: Cardiac silhouette is obscured by significantly enlarging left pleural effusion. No right pleural effusion identified. Right lung appears clear. IMPRESSION: 1. Increase in volume left pleural effusion. Electronically Signed   By: Kerby Moors M.D.   On: 09/30/2017 17:24   Ct Head Wo Contrast  Result Date: 09/30/2017 CLINICAL DATA:  83 year old  female with new incontinence. Disoriented and seeing double. Not eating properly. Lung cancer and breast cancer. Initial encounter. EXAM: CT HEAD WITHOUT CONTRAST TECHNIQUE: Contiguous axial images were obtained from the base of the skull through the vertex without intravenous contrast. COMPARISON:  09/05/2017 brain MR. FINDINGS: Brain: Exam is motion degraded. No intracranial hemorrhage or CT evidence of large acute infarct. Chronic microvascular changes. Atrophy. No intracranial mass lesion noted on this unenhanced exam. Vascular: Vascular calcifications Skull: No acute abnormality Sinuses/Orbits: Suspect retroconal mass left orbit, possibly metastatic disease. Other: Mastoid air cells and middle ear cavities are clear. IMPRESSION: 1. Exam is motion degraded. 2. No intracranial hemorrhage or CT evidence of large acute infarct. Chronic microvascular changes. Atrophy. 3. Suspect retroconal mass left orbit, possibly metastatic disease. Electronically Signed   By: Genia Del M.D.   On: 09/30/2017 17:03   Mr Jeri Cos JQ Contrast  Addendum Date: 09/30/2017   ADDENDUM REPORT: 09/30/2017 17:09 ADDENDUM: The study was reviewed and correlated with the CT of 09/30/2017. There is a subtle intraconal lesion of the LEFT orbit, which appears to have progressed over the 1 month interval. Concern for metastatic disease is raised. These results will be called to the ordering clinician or representative by the Radiologist Assistant, and communication documented in the PACS or zVision Dashboard. Electronically Signed   By: Staci Righter M.D.   On: 09/30/2017 17:09   Result Date: 09/30/2017 CLINICAL DATA:  Diplopia.  Lung cancer.  Possible brain metastases. EXAM: MRI HEAD WITHOUT AND WITH CONTRAST TECHNIQUE: Multiplanar, multiecho pulse sequences of the brain and surrounding structures were obtained without and with intravenous contrast. CONTRAST:  74m MULTIHANCE GADOBENATE DIMEGLUMINE 529 MG/ML IV SOLN COMPARISON:  MR brain  03/25/2012. FINDINGS: Brain: No evidence for acute infarction, hemorrhage, mass lesion, hydrocephalus, or extra-axial fluid. Generalized atrophy. Moderate T2 and FLAIR hyperintensities throughout the white matter, likely small vessel disease. Multiple chronic lacunar infarcts. Post infusion, no abnormal enhancement of the brain or meninges. Vascular: Flow voids are maintained throughout the carotid, basilar, and vertebral arteries. There are no areas of chronic hemorrhage. Skull and upper cervical spine: Unremarkable visualized calvarium, skullbase, and cervical vertebrae. Pituitary, pineal, cerebellar tonsils unremarkable. No upper cervical cord lesions. Sinuses/Orbits: No orbital masses or proptosis. Globes appear symmetric. Sinuses appear well aerated, without evidence for air-fluid level. BILATERAL cataract extraction. Other: None. IMPRESSION: Atrophy and small vessel disease. No acute intracranial findings. No cause is seen for the reported symptoms. No visible intracranial metastatic disease or visible orbital abnormality. Electronically Signed: By: JStaci RighterM.D. On: 09/05/2017 09:52   Dg Chest Left Decubitus  Result Date: 09/17/2017 CLINICAL DATA:  History of left pleural effusion. EXAM: CHEST - LEFT DECUBITUS COMPARISON:  CT 09/06/2017.  Chest x-ray 08/27/2017. FINDINGS: A left-side-down decubitus chest x-ray obtained. A layering moderate size pleural effusion is noted. IMPRESSION: Layering moderate size pleural effusion. Electronically Signed   By: TMarcello Moores Register   On: 09/17/2017 12:24   Ir GLenise ArenaW Catheter Placement  Result Date: 10/02/2017 CLINICAL DATA:  Recurrent malignant left pleural effusion EXAM: INSERTION OF TUNNELED PLEURAL DRAINAGE CATHETER ANESTHESIA/SEDATION: Intravenous Fentanyl and Versed were administered as conscious sedation during continuous  monitoring of the patient's level of consciousness and physiological / cardiorespiratory status by the radiology RN, with a  total moderate sedation time of 12 minutes. MEDICATIONS: Patient was receiving adequate prophylactic antibiotic coverage as an inpatient FLUOROSCOPY TIME:  6 seconds, less than 0.1 mGy PROCEDURE: The procedure, risks, benefits, and alternatives were explained to the patient and son. Questions regarding the procedure were encouraged and answered. The patient understands and consents to the procedure. Ultrasound was utilized to mark an appropriate skin entry site into the left pleural cavity. The left chest wall was prepped with Betadine in a sterile fashion, and a sterile drape was applied covering the operative field. A sterile gown and sterile gloves were used for the procedure. Local anesthesia was provided with 1% Lidocaine. Ultrasound image documentation was performed. Fluoroscopy during the procedure and fluoroscopic spot radiograph confirms appropriate catheter position. After creating a small skin incision, a 19 gauge sheath needle was advanced into the pleural cavity under ultrasound guidance. A guide wire was then advanced under fluoroscopy into the pleural space. Pleural access was dilated serially and a 16-French peel-away sheath placed. A 16 French tunneled PleurX catheter was placed. This was tunneled from an incision 5 cm below the pleural access to the access site. The catheter was advanced through the peel-away sheath. The sheath was then removed. Final catheter positioning was confirmed with a fluoroscopic spot image. The access incision was closed with Dermabond applied to the incision. A 2 0 Ethilon retention suture was applied at the catheter exit site. Large volume thoracentesis removing 1.5 L was performed through the new catheter utilizing suction canisters. COMPLICATIONS: None. FINDINGS: The catheter was placed via the left lateral chest wall. Catheter course is towards the apex. Approximately 1.5 liters of pleural fluid was able to be removed after catheter placement. The patient will be  brought back in 10 to 14days to remove the temporary exit site retention suture after appropriate in-growth around the subcutaneous cuff of the catheter. IMPRESSION: Placement of permanent, tunneled left pleural drainage catheter via lateral approach. 1.5 liters of pleural fluid was removed today after catheter placement. Electronically Signed   By: Lucrezia Europe M.D.   On: 10/02/2017 17:20   Nm Pet Image Initial (pi) Skull Base To Thigh  Result Date: 09/12/2017 CLINICAL DATA:  Subsequent treatment strategy for lung carcinoma. EXAM: NUCLEAR MEDICINE PET SKULL BASE TO THIGH TECHNIQUE: 6.9 mCi F-18 FDG was injected intravenously. Full-ring PET imaging was performed from the skull base to thigh after the radiotracer. CT data was obtained and used for attenuation correction and anatomic localization. Fasting blood glucose: 96 mg/dl COMPARISON:  11/29/2015 FINDINGS: Mediastinal blood pool activity: SUV max 2.34 NECK: No hypermetabolic lymph nodes in the neck. Incidental CT findings: none CHEST: Hypermetabolic nodular thickening along the LEFT upper mediastinal/pleural surface. Example hypermetabolic prevascular lymph node measuring 11 mm with SUV max equal 8.7. More superior nodule along the pleural surface/mediastinum with SUV max equal 14.2. LEFT apical nodule within the pulmonary parenchyma SUV max equal 7.4 measures 11 mm (image 52/4). More anteriorly subpleural nodule with SUV max equal 7.0. Hypermetabolic subpleural nodularity anteriorly within LEFT upper lobe with SUV max equal 4.5 on image 66/4. There is moderate hypermetabolic activity dependently within the LEFT lower lobe effusion with SUV max equal 4.8. Laterally within the LEFT upper lobe along the pleural surface metastatic lesion on image 48 with SUV max equal 5.5 No hypermetabolic nodules in RIGHT lung. Incidental CT findings: Moderate LEFT pleural effusion. ABDOMEN/PELVIS: No abnormal hypermetabolic  activity within the liver, pancreas, adrenal glands,  or spleen. No hypermetabolic lymph nodes in the abdomen or pelvis. Intense metabolic activity associated with the bowel is physiologic Incidental CT findings: Uterus normal. Diverticulosis sigmoid colonnone SKELETON: No focal hypermetabolic activity to suggest skeletal metastasis. Incidental CT findings: None IMPRESSION: 1. Evidence of breast cancer recurrence at multiple sites within the LEFT hemithorax primarily within the pleural and subpleural lung with pedicular concentration in the medial LEFT upper lobe along the mediastinal pleural surface and the anterior sub pleural lung. Several discrete pulmonary nodules within the LEFT upper lobe are also hypermetabolic consistent with metastasis. 2. Moderate LEFT effusion is concerning for malignant effusion. 3. No evidence of metastatic disease outside of the LEFT hemithorax. Electronically Signed   By: Suzy Bouchard M.D.   On: 09/12/2017 17:02   Ir US Guide Bx Asp/drain  Result Date: 10/03/2017 CLINICAL DATA:  Recurrent malignant left pleural effusion EXAM: INSERTION OF TUNNELED PLEURAL DRAINAGE CATHETER ANESTHESIA/SEDATION: Intravenous Fentanyl and Versed were administered as conscious sedation during continuous monitoring of the patient's level of consciousness and physiological / cardiorespiratory status by the radiology RN, with a total moderate sedation time of 12 minutes. MEDICATIONS: Patient was receiving adequate prophylactic antibiotic coverage as an inpatient FLUOROSCOPY TIME:  6 seconds, less than 0.1 mGy PROCEDURE: The procedure, risks, benefits, and alternatives were explained to the patient and son. Questions regarding the procedure were encouraged and answered. The patient understands and consents to the procedure. Ultrasound was utilized to mark an appropriate skin entry site into the left pleural cavity. The left chest wall was prepped with Betadine in a sterile fashion, and a sterile drape was applied covering the operative field. A sterile  gown and sterile gloves were used for the procedure. Local anesthesia was provided with 1% Lidocaine. Ultrasound image documentation was performed. Fluoroscopy during the procedure and fluoroscopic spot radiograph confirms appropriate catheter position. After creating a small skin incision, a 19 gauge sheath needle was advanced into the pleural cavity under ultrasound guidance. A guide wire was then advanced under fluoroscopy into the pleural space. Pleural access was dilated serially and a 16-French peel-away sheath placed. A 16 French tunneled PleurX catheter was placed. This was tunneled from an incision 5 cm below the pleural access to the access site. The catheter was advanced through the peel-away sheath. The sheath was then removed. Final catheter positioning was confirmed with a fluoroscopic spot image. The access incision was closed with Dermabond applied to the incision. A 2 0 Ethilon retention suture was applied at the catheter exit site. Large volume thoracentesis removing 1.5 L was performed through the new catheter utilizing suction canisters. COMPLICATIONS: None. FINDINGS: The catheter was placed via the left lateral chest wall. Catheter course is towards the apex. Approximately 1.5 liters of pleural fluid was able to be removed after catheter placement. The patient will be brought back in 10 to 14days to remove the temporary exit site retention suture after appropriate in-growth around the subcutaneous cuff of the catheter. IMPRESSION: Placement of permanent, tunneled left pleural drainage catheter via lateral approach. 1.5 liters of pleural fluid was removed today after catheter placement. Electronically Signed   By: Lucrezia Europe M.D.   On: 10/02/2017 17:20   US Thoracentesis Asp Pleural Space W/img Guide  Result Date: 09/18/2017 INDICATION: Lung cancer. Recurrence left pleural effusion. Request for therapeutic thoracentesis. EXAM: ULTRASOUND GUIDED LEFT THORACENTESIS MEDICATIONS: 1% lidocaine 10  mL COMPLICATIONS: None immediate. PROCEDURE: An ultrasound guided thoracentesis was thoroughly discussed with the  patient and questions answered. The benefits, risks, alternatives and complications were also discussed. The patient understands and wishes to proceed with the procedure. Written consent was obtained. Ultrasound was performed to localize and mark an adequate pocket of fluid in the left chest. The area was then prepped and draped in the normal sterile fashion. 1% Lidocaine was used for local anesthesia. Under ultrasound guidance a 6 Fr Safe-T-Centesis catheter was introduced. Thoracentesis was performed. The catheter was removed and a dressing applied. FINDINGS: A total of approximately 950 mL of is clear amber fluid was removed. IMPRESSION: Successful ultrasound guided left thoracentesis yielding 950 mL of pleural fluid. No pneumothorax on post-procedure chest x-ray. Read by: Gareth Eagle, PA-C Electronically Signed   By: Aletta Edouard M.D.   On: 09/18/2017 16:05    Microbiology: No results found for this or any previous visit (from the past 240 hour(s)).   Labs: Basic Metabolic Panel: Recent Labs  Lab 09/30/17 1637 10/02/17 0422  NA 139 137  K 3.9 3.8  CL 90* 86*  CO2 35* 39*  GLUCOSE 108* 103*  BUN 22 22  CREATININE 0.75 0.74  CALCIUM 9.0 8.9   Liver Function Tests: Recent Labs  Lab 09/30/17 1637  AST 23  ALT 12  ALKPHOS 58  BILITOT 0.4  PROT 6.9  ALBUMIN 3.3*   Recent Labs  Lab 09/30/17 1637  LIPASE 21   No results for input(s): AMMONIA in the last 168 hours. CBC: Recent Labs  Lab 09/30/17 1637 10/02/17 0422  WBC 11.5* 10.4  NEUTROABS 9.1*  --   HGB 13.7 12.6  HCT 44.0 41.3  MCV 106.3* 107.3*  PLT 457* 425*   Cardiac Enzymes: No results for input(s): CKTOTAL, CKMB, CKMBINDEX, TROPONINI in the last 168 hours. BNP: BNP (last 3 results) Recent Labs    09/30/17 1637  BNP 36.9    ProBNP (last 3 results) Recent Labs    08/22/17 1716  PROBNP  60.0    CBG: No results for input(s): GLUCAP in the last 168 hours.     Signed:  Jassica Zazueta  [facility] 10/03/2017, 9:29 AM

## 2017-10-03 NOTE — Progress Notes (Addendum)
I called  [place] at [phone]. I gave report to [place] . AVS printed with medication list and given to [facility] .

## 2017-10-03 NOTE — Progress Notes (Addendum)
Patient to transfer to [facility] today.  Sons to complete paperwork with [facility]PO[facility] liaison MaryAnn.  Patient will discharge via [facility].  Med. Nes, complete.   [facility] arranged for transport.   Kathrin Greathouse, Marlinda Mike, MSW Clinical Social Worker  [phone] 10/03/2017  9:47 AM

## 2017-10-03 NOTE — Progress Notes (Signed)
[facility] RN note.  Received request from [place] for family interest in [facility] with request for transfer today. Chart reviewed and eligibility approved.  Met with son, Nicki Reaper to confirm interest and explain services. Family agreeable to transfer today. CSW aware.  Registration paper work completed. Dr. Orpah Melter  to assume care per family request. Please fax discharge summary to [phone]. RN please call report to [phone]. Please arrange transport for patient to arrive as soon as possible.   Thank you,  Chairty Toman, RN, [facility] Liaison  [place] are on [facility]

## 2017-10-10 ENCOUNTER — Ambulatory Visit: Payer: Medicare Other | Admitting: Internal Medicine

## 2017-10-27 DEATH — deceased

## 2017-11-13 IMAGING — CT NM PET TUM IMG INITIAL (PI) SKULL BASE T - THIGH
1 of 8 series · 1 of 25 positions shown · non-contrast
Comparison: CT 11/10/15

CLINICAL DATA: Set treatment strategy for LEFT breast cancer.

EXAM:
NUCLEAR MEDICINE PET SKULL BASE TO THIGH
TECHNIQUE: 7.1 mCi F-18 FDG was injected intravenously. Full-ring PET imaging
was performed from the skull base to thigh after the radiotracer. CT
data was obtained and used for attenuation correction and anatomic
localization.
FASTING BLOOD GLUCOSE:  Value: 99 mg/dl

[Series 4: ct sk_thigh 5.0 b31f · axial · 5.0mm · 0.98mm/px · 1 of 216 slices shown]
[im 216/216  brain]
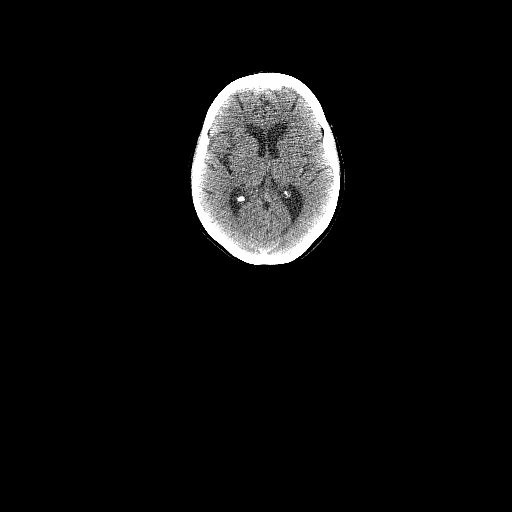

[1 of 25 positions shown; findings below may reference images not displayed]

FINDINGS: NECK

No hypermetabolic lymph nodes in the neck.

CHEST

Nodules described on comparison CT are intensely hypermetabolic. For
example RIGHT upper lobe nodule an RIGHT lower lobe nodule measuring
16 mm (image 85, series 4) with SUV max equal 6.2 compares 17 mm on
comparison CT.

Cluster of RIGHT upper lobe nodules are intensely hypermetabolic
with SUV max equal 6.5. Linear thickening in the lingula is
intensely hypermetabolic with SUV max equals 6.4.

Several nodules are increased in size in the short interval
including 8 mm nodule on image 81, series 4 in the RIGHT middle lobe
increased from 6 mm. Nodule medial aspect of the RIGHT upper lobe
measuring 6 mm is new from prior (image 61, series 4).

No hypermetabolic mediastinal lymph nodes.

Hypermetabolic skin thickening over the LEFT breast.

ABDOMEN/PELVIS

No abnormal hypermetabolic activity within the liver, pancreas,
adrenal glands, or spleen. No hypermetabolic lymph nodes in the
abdomen or pelvis.

SKELETON

No focal hypermetabolic activity to suggest skeletal metastasis.
IMPRESSION: 1. Bilateral hypermetabolic pulmonary nodules are concerning for
malignancy but cannot exclude infectious process. Recommend
bronchoscopy for tissue sampling.
2. Several of the nodules are slightly increased or new from
comparison exam. Rapidly enlarging nodules would favor an infectious
process; however cannot exclude malignancy.
3. Hypermetabolic skin thickening of the LEFT breast is nonspecific
may relate to prior therapy.
